# Patient Record
Sex: Female | Born: 1935 | Race: Black or African American | Hispanic: No | Marital: Married | Smoking: Never smoker
Health system: Southern US, Community
[De-identification: ages and names within clinical notes are randomized; demographics above are authoritative.]

## PROBLEM LIST (undated history)

## (undated) DIAGNOSIS — K7460 Unspecified cirrhosis of liver: Secondary | ICD-10-CM

## (undated) DIAGNOSIS — R279 Unspecified lack of coordination: Secondary | ICD-10-CM

## (undated) DIAGNOSIS — I10 Essential (primary) hypertension: Secondary | ICD-10-CM

## (undated) DIAGNOSIS — M1710 Unilateral primary osteoarthritis, unspecified knee: Secondary | ICD-10-CM

## (undated) DIAGNOSIS — N259 Disorder resulting from impaired renal tubular function, unspecified: Secondary | ICD-10-CM

## (undated) DIAGNOSIS — M1990 Unspecified osteoarthritis, unspecified site: Secondary | ICD-10-CM

## (undated) DIAGNOSIS — E109 Type 1 diabetes mellitus without complications: Secondary | ICD-10-CM

## (undated) DIAGNOSIS — M545 Low back pain, unspecified: Secondary | ICD-10-CM

## (undated) DIAGNOSIS — M79609 Pain in unspecified limb: Secondary | ICD-10-CM

## (undated) DIAGNOSIS — F329 Major depressive disorder, single episode, unspecified: Secondary | ICD-10-CM

## (undated) DIAGNOSIS — M719 Bursopathy, unspecified: Secondary | ICD-10-CM

## (undated) DIAGNOSIS — E039 Hypothyroidism, unspecified: Secondary | ICD-10-CM

## (undated) DIAGNOSIS — E785 Hyperlipidemia, unspecified: Secondary | ICD-10-CM

## (undated) DIAGNOSIS — M67919 Unspecified disorder of synovium and tendon, unspecified shoulder: Secondary | ICD-10-CM

## (undated) DIAGNOSIS — G894 Chronic pain syndrome: Secondary | ICD-10-CM

## (undated) DIAGNOSIS — F068 Other specified mental disorders due to known physiological condition: Secondary | ICD-10-CM

## (undated) DIAGNOSIS — J984 Other disorders of lung: Secondary | ICD-10-CM

## (undated) DIAGNOSIS — G510 Bell's palsy: Secondary | ICD-10-CM

## (undated) DIAGNOSIS — Z86010 Personal history of colonic polyps: Secondary | ICD-10-CM

## (undated) HISTORY — DX: Chronic pain syndrome: G89.4

## (undated) HISTORY — DX: Personal history of colonic polyps: Z86.010

## (undated) HISTORY — PX: COLONOSCOPY W/ POLYPECTOMY: SHX1380

## (undated) HISTORY — DX: Other disorders of lung: J98.4

## (undated) HISTORY — DX: Low back pain: M54.5

## (undated) HISTORY — DX: Pain in unspecified limb: M79.609

## (undated) HISTORY — DX: Unspecified osteoarthritis, unspecified site: M19.90

## (undated) HISTORY — DX: Unspecified disorder of synovium and tendon, unspecified shoulder: M67.919

## (undated) HISTORY — DX: Other specified mental disorders due to known physiological condition: F06.8

## (undated) HISTORY — DX: Essential (primary) hypertension: I10

## (undated) HISTORY — DX: Low back pain, unspecified: M54.50

## (undated) HISTORY — DX: Hypothyroidism, unspecified: E03.9

## (undated) HISTORY — DX: Major depressive disorder, single episode, unspecified: F32.9

## (undated) HISTORY — PX: TOTAL ABDOMINAL HYSTERECTOMY: SHX209

## (undated) HISTORY — DX: Disorder resulting from impaired renal tubular function, unspecified: N25.9

## (undated) HISTORY — DX: Unilateral primary osteoarthritis, unspecified knee: M17.10

## (undated) HISTORY — DX: Unspecified lack of coordination: R27.9

## (undated) HISTORY — DX: Hyperlipidemia, unspecified: E78.5

## (undated) HISTORY — DX: Bursopathy, unspecified: M71.9

## (undated) HISTORY — DX: Type 1 diabetes mellitus without complications: E10.9

---

## 1999-01-17 ENCOUNTER — Encounter: Admission: RE | Admit: 1999-01-17 | Discharge: 1999-04-17 | Payer: Self-pay | Admitting: Internal Medicine

## 2001-10-20 ENCOUNTER — Encounter: Payer: Self-pay | Admitting: Internal Medicine

## 2001-10-20 ENCOUNTER — Encounter: Admission: RE | Admit: 2001-10-20 | Discharge: 2001-10-20 | Payer: Self-pay | Admitting: Internal Medicine

## 2002-01-14 ENCOUNTER — Ambulatory Visit (HOSPITAL_COMMUNITY): Admission: RE | Admit: 2002-01-14 | Discharge: 2002-01-14 | Payer: Self-pay | Admitting: Gastroenterology

## 2002-01-14 ENCOUNTER — Encounter (INDEPENDENT_AMBULATORY_CARE_PROVIDER_SITE_OTHER): Payer: Self-pay | Admitting: *Deleted

## 2004-01-28 ENCOUNTER — Ambulatory Visit: Payer: Self-pay | Admitting: Endocrinology

## 2004-02-15 ENCOUNTER — Ambulatory Visit: Payer: Self-pay | Admitting: Endocrinology

## 2004-03-24 ENCOUNTER — Ambulatory Visit: Payer: Self-pay | Admitting: Endocrinology

## 2004-04-13 ENCOUNTER — Encounter: Admission: RE | Admit: 2004-04-13 | Discharge: 2004-04-13 | Payer: Self-pay | Admitting: Endocrinology

## 2004-04-18 ENCOUNTER — Ambulatory Visit: Payer: Self-pay | Admitting: Endocrinology

## 2004-04-26 ENCOUNTER — Ambulatory Visit: Payer: Self-pay

## 2005-01-01 ENCOUNTER — Ambulatory Visit (HOSPITAL_COMMUNITY): Admission: RE | Admit: 2005-01-01 | Discharge: 2005-01-01 | Payer: Self-pay | Admitting: Endocrinology

## 2005-01-01 ENCOUNTER — Ambulatory Visit: Payer: Self-pay | Admitting: Endocrinology

## 2005-01-08 ENCOUNTER — Ambulatory Visit: Payer: Self-pay | Admitting: Endocrinology

## 2005-01-12 ENCOUNTER — Ambulatory Visit: Payer: Self-pay | Admitting: Internal Medicine

## 2005-01-23 ENCOUNTER — Ambulatory Visit: Payer: Self-pay | Admitting: Endocrinology

## 2005-02-08 ENCOUNTER — Ambulatory Visit (HOSPITAL_COMMUNITY): Admission: RE | Admit: 2005-02-08 | Discharge: 2005-02-08 | Payer: Self-pay | Admitting: Endocrinology

## 2005-02-12 ENCOUNTER — Ambulatory Visit: Payer: Self-pay | Admitting: Endocrinology

## 2005-02-16 ENCOUNTER — Ambulatory Visit: Payer: Self-pay

## 2005-02-16 ENCOUNTER — Encounter: Payer: Self-pay | Admitting: Internal Medicine

## 2005-02-20 ENCOUNTER — Ambulatory Visit: Payer: Self-pay | Admitting: Endocrinology

## 2005-03-22 ENCOUNTER — Ambulatory Visit: Payer: Self-pay | Admitting: Endocrinology

## 2005-03-29 ENCOUNTER — Ambulatory Visit: Payer: Self-pay | Admitting: Endocrinology

## 2005-04-23 ENCOUNTER — Ambulatory Visit: Payer: Self-pay | Admitting: Endocrinology

## 2005-05-21 ENCOUNTER — Ambulatory Visit: Payer: Self-pay | Admitting: Endocrinology

## 2005-06-18 ENCOUNTER — Ambulatory Visit: Payer: Self-pay | Admitting: Endocrinology

## 2005-07-19 ENCOUNTER — Ambulatory Visit: Payer: Self-pay | Admitting: Endocrinology

## 2005-08-15 ENCOUNTER — Ambulatory Visit: Payer: Self-pay | Admitting: Endocrinology

## 2005-09-12 ENCOUNTER — Ambulatory Visit: Payer: Self-pay | Admitting: Endocrinology

## 2005-10-24 ENCOUNTER — Ambulatory Visit: Payer: Self-pay | Admitting: Endocrinology

## 2005-11-28 ENCOUNTER — Ambulatory Visit: Payer: Self-pay | Admitting: Endocrinology

## 2005-12-19 ENCOUNTER — Ambulatory Visit: Payer: Self-pay | Admitting: Endocrinology

## 2006-02-12 ENCOUNTER — Ambulatory Visit: Payer: Self-pay | Admitting: Endocrinology

## 2006-03-27 ENCOUNTER — Ambulatory Visit: Payer: Self-pay | Admitting: Endocrinology

## 2006-05-16 ENCOUNTER — Ambulatory Visit: Payer: Self-pay | Admitting: Endocrinology

## 2006-05-16 LAB — CONVERTED CEMR LAB: Hgb A1c MFr Bld: 8.1 % — ABNORMAL HIGH (ref 4.6–6.0)

## 2006-06-10 ENCOUNTER — Ambulatory Visit: Payer: Self-pay | Admitting: Endocrinology

## 2006-07-22 ENCOUNTER — Ambulatory Visit: Payer: Self-pay | Admitting: Endocrinology

## 2006-08-23 ENCOUNTER — Encounter: Payer: Self-pay | Admitting: Endocrinology

## 2006-08-23 DIAGNOSIS — E109 Type 1 diabetes mellitus without complications: Secondary | ICD-10-CM

## 2006-08-23 DIAGNOSIS — E785 Hyperlipidemia, unspecified: Secondary | ICD-10-CM

## 2006-08-23 DIAGNOSIS — E039 Hypothyroidism, unspecified: Secondary | ICD-10-CM

## 2006-08-23 DIAGNOSIS — F329 Major depressive disorder, single episode, unspecified: Secondary | ICD-10-CM

## 2006-08-23 DIAGNOSIS — F3289 Other specified depressive episodes: Secondary | ICD-10-CM

## 2006-08-23 DIAGNOSIS — M1990 Unspecified osteoarthritis, unspecified site: Secondary | ICD-10-CM

## 2006-08-23 DIAGNOSIS — I10 Essential (primary) hypertension: Secondary | ICD-10-CM

## 2006-08-23 DIAGNOSIS — N259 Disorder resulting from impaired renal tubular function, unspecified: Secondary | ICD-10-CM | POA: Insufficient documentation

## 2006-08-23 DIAGNOSIS — F068 Other specified mental disorders due to known physiological condition: Secondary | ICD-10-CM

## 2006-08-23 HISTORY — DX: Type 1 diabetes mellitus without complications: E10.9

## 2006-08-23 HISTORY — DX: Major depressive disorder, single episode, unspecified: F32.9

## 2006-08-23 HISTORY — DX: Hypothyroidism, unspecified: E03.9

## 2006-08-23 HISTORY — DX: Unspecified osteoarthritis, unspecified site: M19.90

## 2006-08-23 HISTORY — DX: Essential (primary) hypertension: I10

## 2006-08-23 HISTORY — DX: Hyperlipidemia, unspecified: E78.5

## 2006-08-23 HISTORY — DX: Other specified mental disorders due to known physiological condition: F06.8

## 2006-08-23 HISTORY — DX: Disorder resulting from impaired renal tubular function, unspecified: N25.9

## 2006-08-23 HISTORY — DX: Other specified depressive episodes: F32.89

## 2006-08-30 ENCOUNTER — Ambulatory Visit: Payer: Self-pay | Admitting: Endocrinology

## 2006-08-30 LAB — CONVERTED CEMR LAB
Chloride: 108 meq/L (ref 96–112)
Cholesterol: 131 mg/dL (ref 0–200)
Creatinine, Ser: 1.4 mg/dL — ABNORMAL HIGH (ref 0.4–1.2)
Eosinophils Relative: 1.8 % (ref 0.0–5.0)
GFR calc Af Amer: 47 mL/min
GFR calc non Af Amer: 39 mL/min
Glucose, Bld: 73 mg/dL (ref 70–99)
HCT: 47.3 % — ABNORMAL HIGH (ref 36.0–46.0)
Hemoglobin: 16.1 g/dL — ABNORMAL HIGH (ref 12.0–15.0)
LDL Cholesterol: 61 mg/dL (ref 0–99)
MCHC: 34.1 g/dL (ref 30.0–36.0)
MCV: 98.3 fL (ref 78.0–100.0)
Monocytes Relative: 3.6 % (ref 3.0–11.0)
Platelets: 298 10*3/uL (ref 150–400)
Potassium: 4.6 meq/L (ref 3.5–5.1)
Sodium: 146 meq/L — ABNORMAL HIGH (ref 135–145)
Triglycerides: 120 mg/dL (ref 0–149)

## 2006-10-28 ENCOUNTER — Ambulatory Visit: Payer: Self-pay | Admitting: Endocrinology

## 2007-01-07 ENCOUNTER — Ambulatory Visit: Payer: Self-pay | Admitting: Endocrinology

## 2007-02-06 ENCOUNTER — Telehealth (INDEPENDENT_AMBULATORY_CARE_PROVIDER_SITE_OTHER): Payer: Self-pay | Admitting: *Deleted

## 2007-02-07 ENCOUNTER — Ambulatory Visit: Payer: Self-pay | Admitting: Endocrinology

## 2007-02-10 ENCOUNTER — Telehealth (INDEPENDENT_AMBULATORY_CARE_PROVIDER_SITE_OTHER): Payer: Self-pay | Admitting: *Deleted

## 2007-04-04 ENCOUNTER — Telehealth: Payer: Self-pay | Admitting: Endocrinology

## 2007-04-21 ENCOUNTER — Telehealth: Payer: Self-pay | Admitting: Endocrinology

## 2007-04-29 ENCOUNTER — Ambulatory Visit: Payer: Self-pay | Admitting: Endocrinology

## 2007-04-29 DIAGNOSIS — Z860100 Personal history of colon polyps, unspecified: Secondary | ICD-10-CM | POA: Insufficient documentation

## 2007-04-29 HISTORY — DX: Personal history of colon polyps, unspecified: Z86.0100

## 2007-04-29 HISTORY — DX: Personal history of colonic polyps: Z86.010

## 2007-04-30 LAB — CONVERTED CEMR LAB: Hgb A1c MFr Bld: 6.8 % — ABNORMAL HIGH (ref 4.6–6.0)

## 2007-05-22 ENCOUNTER — Telehealth: Payer: Self-pay | Admitting: Endocrinology

## 2007-06-17 ENCOUNTER — Encounter: Payer: Self-pay | Admitting: Endocrinology

## 2007-06-18 ENCOUNTER — Telehealth (INDEPENDENT_AMBULATORY_CARE_PROVIDER_SITE_OTHER): Payer: Self-pay | Admitting: *Deleted

## 2007-06-20 ENCOUNTER — Telehealth: Payer: Self-pay | Admitting: Endocrinology

## 2007-06-25 ENCOUNTER — Telehealth (INDEPENDENT_AMBULATORY_CARE_PROVIDER_SITE_OTHER): Payer: Self-pay | Admitting: *Deleted

## 2007-07-02 ENCOUNTER — Telehealth (INDEPENDENT_AMBULATORY_CARE_PROVIDER_SITE_OTHER): Payer: Self-pay | Admitting: *Deleted

## 2007-09-16 ENCOUNTER — Ambulatory Visit: Payer: Self-pay | Admitting: Endocrinology

## 2007-09-16 ENCOUNTER — Encounter (INDEPENDENT_AMBULATORY_CARE_PROVIDER_SITE_OTHER): Payer: Self-pay | Admitting: *Deleted

## 2007-12-08 ENCOUNTER — Telehealth: Payer: Self-pay | Admitting: Endocrinology

## 2007-12-09 ENCOUNTER — Encounter: Payer: Self-pay | Admitting: Endocrinology

## 2008-01-23 ENCOUNTER — Telehealth (INDEPENDENT_AMBULATORY_CARE_PROVIDER_SITE_OTHER): Payer: Self-pay | Admitting: *Deleted

## 2008-01-30 ENCOUNTER — Ambulatory Visit: Payer: Self-pay | Admitting: Endocrinology

## 2008-01-30 ENCOUNTER — Ambulatory Visit: Payer: Self-pay | Admitting: Cardiology

## 2008-01-30 DIAGNOSIS — R519 Headache, unspecified: Secondary | ICD-10-CM | POA: Insufficient documentation

## 2008-01-30 DIAGNOSIS — M545 Low back pain: Secondary | ICD-10-CM

## 2008-01-30 DIAGNOSIS — R51 Headache: Secondary | ICD-10-CM

## 2008-02-10 DIAGNOSIS — K7689 Other specified diseases of liver: Secondary | ICD-10-CM

## 2008-02-10 LAB — CONVERTED CEMR LAB
ALT: 43 units/L — ABNORMAL HIGH (ref 0–35)
Alkaline Phosphatase: 98 units/L (ref 39–117)
Bilirubin, Direct: 0.1 mg/dL (ref 0.0–0.3)
Total Bilirubin: 0.8 mg/dL (ref 0.3–1.2)
Total Protein: 6.7 g/dL (ref 6.0–8.3)

## 2008-02-11 ENCOUNTER — Encounter: Payer: Self-pay | Admitting: Endocrinology

## 2008-02-12 ENCOUNTER — Telehealth (INDEPENDENT_AMBULATORY_CARE_PROVIDER_SITE_OTHER): Payer: Self-pay | Admitting: *Deleted

## 2008-02-16 ENCOUNTER — Telehealth: Payer: Self-pay | Admitting: Endocrinology

## 2008-02-20 ENCOUNTER — Telehealth: Payer: Self-pay | Admitting: Endocrinology

## 2008-02-23 ENCOUNTER — Encounter: Payer: Self-pay | Admitting: Endocrinology

## 2008-03-11 ENCOUNTER — Telehealth: Payer: Self-pay | Admitting: Endocrinology

## 2008-05-19 ENCOUNTER — Ambulatory Visit: Payer: Self-pay | Admitting: Endocrinology

## 2008-05-19 LAB — CONVERTED CEMR LAB
Hgb A1c MFr Bld: 8.3 % — ABNORMAL HIGH (ref 4.6–6.0)
Sed Rate: 12 mm/hr (ref 0–22)

## 2008-06-03 ENCOUNTER — Ambulatory Visit: Payer: Self-pay | Admitting: Endocrinology

## 2008-06-04 ENCOUNTER — Telehealth: Payer: Self-pay | Admitting: Endocrinology

## 2008-06-15 ENCOUNTER — Encounter: Payer: Self-pay | Admitting: Endocrinology

## 2008-06-18 ENCOUNTER — Encounter: Payer: Self-pay | Admitting: Endocrinology

## 2008-06-23 ENCOUNTER — Telehealth (INDEPENDENT_AMBULATORY_CARE_PROVIDER_SITE_OTHER): Payer: Self-pay | Admitting: *Deleted

## 2008-07-05 ENCOUNTER — Telehealth: Payer: Self-pay | Admitting: Endocrinology

## 2008-07-27 ENCOUNTER — Ambulatory Visit: Payer: Self-pay | Admitting: Endocrinology

## 2008-07-27 DIAGNOSIS — R61 Generalized hyperhidrosis: Secondary | ICD-10-CM

## 2008-07-27 LAB — CONVERTED CEMR LAB
BUN: 18 mg/dL (ref 6–23)
CO2: 31 meq/L (ref 19–32)
Chloride: 108 meq/L (ref 96–112)
Eosinophils Absolute: 0.1 10*3/uL (ref 0.0–0.7)
Glucose, Bld: 222 mg/dL — ABNORMAL HIGH (ref 70–99)
Hemoglobin: 15.3 g/dL — ABNORMAL HIGH (ref 12.0–15.0)
Hgb A1c MFr Bld: 9.2 % — ABNORMAL HIGH (ref 4.6–6.5)
MCV: 98.7 fL (ref 78.0–100.0)
Monocytes Absolute: 0.3 10*3/uL (ref 0.1–1.0)
Neutrophils Relative %: 74.2 % (ref 43.0–77.0)
Potassium: 4.4 meq/L (ref 3.5–5.1)
RBC: 4.51 M/uL (ref 3.87–5.11)
Sodium: 145 meq/L (ref 135–145)
TSH: 9.12 microintl units/mL — ABNORMAL HIGH (ref 0.35–5.50)

## 2008-08-27 ENCOUNTER — Telehealth: Payer: Self-pay | Admitting: Endocrinology

## 2008-09-14 ENCOUNTER — Ambulatory Visit: Payer: Self-pay | Admitting: Endocrinology

## 2008-11-01 ENCOUNTER — Telehealth (INDEPENDENT_AMBULATORY_CARE_PROVIDER_SITE_OTHER): Payer: Self-pay | Admitting: *Deleted

## 2008-11-04 ENCOUNTER — Telehealth (INDEPENDENT_AMBULATORY_CARE_PROVIDER_SITE_OTHER): Payer: Self-pay | Admitting: *Deleted

## 2008-11-08 ENCOUNTER — Ambulatory Visit: Payer: Self-pay | Admitting: Endocrinology

## 2008-11-18 ENCOUNTER — Telehealth: Payer: Self-pay | Admitting: Endocrinology

## 2008-11-18 ENCOUNTER — Telehealth (INDEPENDENT_AMBULATORY_CARE_PROVIDER_SITE_OTHER): Payer: Self-pay | Admitting: *Deleted

## 2008-11-26 ENCOUNTER — Telehealth: Payer: Self-pay | Admitting: Endocrinology

## 2008-12-21 ENCOUNTER — Ambulatory Visit: Payer: Self-pay | Admitting: Endocrinology

## 2009-01-04 ENCOUNTER — Ambulatory Visit: Payer: Self-pay | Admitting: Endocrinology

## 2009-01-18 ENCOUNTER — Ambulatory Visit: Payer: Self-pay | Admitting: Endocrinology

## 2009-01-18 DIAGNOSIS — R0602 Shortness of breath: Secondary | ICD-10-CM | POA: Insufficient documentation

## 2009-01-18 DIAGNOSIS — R609 Edema, unspecified: Secondary | ICD-10-CM

## 2009-01-19 ENCOUNTER — Encounter: Payer: Self-pay | Admitting: Endocrinology

## 2009-01-19 ENCOUNTER — Ambulatory Visit (HOSPITAL_COMMUNITY): Admission: RE | Admit: 2009-01-19 | Discharge: 2009-01-19 | Payer: Self-pay | Admitting: Endocrinology

## 2009-01-19 ENCOUNTER — Ambulatory Visit: Payer: Self-pay | Admitting: Vascular Surgery

## 2009-01-19 LAB — CONVERTED CEMR LAB
Alkaline Phosphatase: 150 units/L — ABNORMAL HIGH (ref 39–117)
BUN: 26 mg/dL — ABNORMAL HIGH (ref 6–23)
Basophils Absolute: 0 10*3/uL (ref 0.0–0.1)
Basophils Relative: 0.7 % (ref 0.0–3.0)
Bilirubin, Direct: 0.2 mg/dL (ref 0.0–0.3)
Calcium: 8.7 mg/dL (ref 8.4–10.5)
Cholesterol: 126 mg/dL (ref 0–200)
Creatinine,U: 165.1 mg/dL
Eosinophils Relative: 2.2 % (ref 0.0–5.0)
Folate: >20 ng/mL
GFR calc non Af Amer: 46.63 mL/min (ref >60)
HDL: 51.9 mg/dL (ref >39.00)
Hemoglobin, Urine: NEGATIVE
Hgb A1c MFr Bld: 10 % — ABNORMAL HIGH (ref 4.6–6.5)
Ketones, ur: NEGATIVE mg/dL
LDL Cholesterol: 58 mg/dL (ref 0–99)
Leukocytes, UA: NEGATIVE
MCHC: 33.6 g/dL (ref 30.0–36.0)
MCV: 101.6 fL — ABNORMAL HIGH (ref 78.0–100.0)
Microalb Creat Ratio: 7.3 mg/g (ref 0.0–30.0)
Monocytes Absolute: 0.4 10*3/uL (ref 0.1–1.0)
Neutro Abs: 4.2 10*3/uL (ref 1.4–7.7)
Neutrophils Relative %: 68.9 % (ref 43.0–77.0)
Nitrite: NEGATIVE
RBC: 4.2 M/uL (ref 3.87–5.11)
Sodium: 140 meq/L (ref 135–145)
Specific Gravity, Urine: 1.02 (ref 1.000–1.030)
Total Bilirubin: 0.9 mg/dL (ref 0.3–1.2)
Triglycerides: 79 mg/dL (ref 0.0–149.0)
Urine Glucose: NEGATIVE mg/dL
Urobilinogen, UA: 0.2 (ref 0.0–1.0)
VLDL: 15.8 mg/dL (ref 0.0–40.0)
pH: 5.5 (ref 5.0–8.0)

## 2009-01-27 ENCOUNTER — Ambulatory Visit: Payer: Self-pay | Admitting: Endocrinology

## 2009-01-31 ENCOUNTER — Telehealth: Payer: Self-pay | Admitting: Endocrinology

## 2009-02-03 ENCOUNTER — Telehealth (INDEPENDENT_AMBULATORY_CARE_PROVIDER_SITE_OTHER): Payer: Self-pay | Admitting: *Deleted

## 2009-02-21 ENCOUNTER — Telehealth: Payer: Self-pay | Admitting: Endocrinology

## 2009-02-22 ENCOUNTER — Ambulatory Visit: Payer: Self-pay | Admitting: Endocrinology

## 2009-03-15 ENCOUNTER — Telehealth (INDEPENDENT_AMBULATORY_CARE_PROVIDER_SITE_OTHER): Payer: Self-pay | Admitting: *Deleted

## 2009-03-17 ENCOUNTER — Encounter (INDEPENDENT_AMBULATORY_CARE_PROVIDER_SITE_OTHER): Payer: Self-pay | Admitting: *Deleted

## 2009-04-12 ENCOUNTER — Telehealth: Payer: Self-pay | Admitting: Internal Medicine

## 2009-04-20 ENCOUNTER — Encounter
Admission: RE | Admit: 2009-04-20 | Discharge: 2009-07-19 | Payer: Self-pay | Admitting: Physical Medicine and Rehabilitation

## 2009-04-21 ENCOUNTER — Telehealth: Payer: Self-pay | Admitting: Endocrinology

## 2009-04-22 ENCOUNTER — Ambulatory Visit: Payer: Self-pay | Admitting: Physical Medicine and Rehabilitation

## 2009-05-10 ENCOUNTER — Telehealth: Payer: Self-pay | Admitting: Endocrinology

## 2009-05-10 ENCOUNTER — Ambulatory Visit (HOSPITAL_COMMUNITY)
Admission: RE | Admit: 2009-05-10 | Discharge: 2009-05-10 | Payer: Self-pay | Admitting: Physical Medicine and Rehabilitation

## 2009-05-18 ENCOUNTER — Ambulatory Visit: Payer: Self-pay | Admitting: Physical Medicine and Rehabilitation

## 2009-06-17 ENCOUNTER — Ambulatory Visit: Payer: Self-pay | Admitting: Endocrinology

## 2009-06-21 LAB — CONVERTED CEMR LAB
Hgb A1c MFr Bld: 10 % — ABNORMAL HIGH (ref 4.6–6.5)
LDL Cholesterol: 59 mg/dL (ref 0–99)
Total CHOL/HDL Ratio: 2
VLDL: 22.6 mg/dL (ref 0.0–40.0)

## 2009-06-24 ENCOUNTER — Ambulatory Visit: Payer: Self-pay | Admitting: Physical Medicine and Rehabilitation

## 2009-07-05 ENCOUNTER — Ambulatory Visit (HOSPITAL_COMMUNITY)
Admission: RE | Admit: 2009-07-05 | Discharge: 2009-07-05 | Payer: Self-pay | Admitting: Physical Medicine and Rehabilitation

## 2009-07-26 ENCOUNTER — Encounter
Admission: RE | Admit: 2009-07-26 | Discharge: 2009-08-05 | Payer: Self-pay | Admitting: Physical Medicine & Rehabilitation

## 2009-07-29 ENCOUNTER — Ambulatory Visit: Payer: Self-pay | Admitting: Physical Medicine and Rehabilitation

## 2009-07-29 ENCOUNTER — Encounter
Admission: RE | Admit: 2009-07-29 | Discharge: 2009-10-27 | Payer: Self-pay | Admitting: Physical Medicine and Rehabilitation

## 2009-08-03 ENCOUNTER — Telehealth: Payer: Self-pay | Admitting: Endocrinology

## 2009-08-12 ENCOUNTER — Encounter: Payer: Self-pay | Admitting: Endocrinology

## 2009-08-12 ENCOUNTER — Ambulatory Visit: Payer: Self-pay | Admitting: Physical Medicine and Rehabilitation

## 2009-08-17 ENCOUNTER — Telehealth: Payer: Self-pay | Admitting: Endocrinology

## 2009-09-09 ENCOUNTER — Ambulatory Visit: Payer: Self-pay | Admitting: Physical Medicine and Rehabilitation

## 2009-09-16 ENCOUNTER — Ambulatory Visit: Payer: Self-pay | Admitting: Endocrinology

## 2009-09-28 ENCOUNTER — Encounter
Admission: RE | Admit: 2009-09-28 | Discharge: 2009-12-18 | Payer: Self-pay | Admitting: Physical Medicine and Rehabilitation

## 2009-10-12 ENCOUNTER — Ambulatory Visit: Payer: Self-pay | Admitting: Physical Medicine and Rehabilitation

## 2009-11-02 ENCOUNTER — Ambulatory Visit: Payer: Self-pay | Admitting: Physical Medicine and Rehabilitation

## 2009-11-02 ENCOUNTER — Encounter
Admission: RE | Admit: 2009-11-02 | Discharge: 2010-01-31 | Payer: Self-pay | Admitting: Physical Medicine and Rehabilitation

## 2009-11-29 ENCOUNTER — Telehealth: Payer: Self-pay | Admitting: Endocrinology

## 2009-12-05 ENCOUNTER — Encounter
Admission: RE | Admit: 2009-12-05 | Discharge: 2009-12-15 | Payer: Self-pay | Admitting: Physical Medicine and Rehabilitation

## 2009-12-21 ENCOUNTER — Ambulatory Visit: Payer: Self-pay | Admitting: Physical Medicine and Rehabilitation

## 2009-12-21 ENCOUNTER — Encounter
Admission: RE | Admit: 2009-12-21 | Discharge: 2010-01-04 | Payer: Self-pay | Admitting: Physical Medicine and Rehabilitation

## 2009-12-22 ENCOUNTER — Ambulatory Visit: Payer: Self-pay | Admitting: Endocrinology

## 2009-12-22 LAB — CONVERTED CEMR LAB
ALT: 27 units/L (ref 0–35)
AST: 39 units/L — ABNORMAL HIGH (ref 0–37)
BUN: 19 mg/dL (ref 6–23)
Bilirubin Urine: NEGATIVE
Bilirubin, Direct: 0.2 mg/dL (ref 0.0–0.3)
CO2: 35 meq/L — ABNORMAL HIGH (ref 19–32)
Cholesterol: 271 mg/dL — ABNORMAL HIGH (ref 0–200)
Creatinine,U: 79.8 mg/dL
Eosinophils Relative: 2.5 % (ref 0.0–5.0)
GFR calc non Af Amer: 51.59 mL/min (ref >60)
Glucose, Bld: 216 mg/dL — ABNORMAL HIGH (ref 70–99)
Hgb A1c MFr Bld: 11.3 % — ABNORMAL HIGH (ref 4.6–6.5)
Leukocytes, UA: NEGATIVE
Lymphocytes Relative: 25.5 % (ref 12.0–46.0)
MCHC: 34.4 g/dL (ref 30.0–36.0)
Microalb, Ur: 0.5 mg/dL (ref 0.0–1.9)
Monocytes Absolute: 0.4 10*3/uL (ref 0.1–1.0)
Neutro Abs: 3.8 10*3/uL (ref 1.4–7.7)
Neutrophils Relative %: 65.2 % (ref 43.0–77.0)
Nitrite: NEGATIVE
PTH: 49.3 pg/mL (ref 14.0–72.0)
Platelets: 165 10*3/uL (ref 150.0–400.0)
Potassium: 5.2 meq/L — ABNORMAL HIGH (ref 3.5–5.1)
RDW: 13 % (ref 11.5–14.6)
TSH: 4.74 microintl units/mL (ref 0.35–5.50)
Total CHOL/HDL Ratio: 4
Total Protein, Urine: NEGATIVE mg/dL
Total Protein: 6.8 g/dL (ref 6.0–8.3)
Urine Glucose: 100 mg/dL
Urobilinogen, UA: 1 (ref 0.0–1.0)
VLDL: 43.6 mg/dL — ABNORMAL HIGH (ref 0.0–40.0)
WBC: 5.9 10*3/uL (ref 4.5–10.5)

## 2010-01-16 ENCOUNTER — Ambulatory Visit: Payer: Self-pay | Admitting: Physical Medicine and Rehabilitation

## 2010-03-14 ENCOUNTER — Encounter
Admission: RE | Admit: 2010-03-14 | Discharge: 2010-03-27 | Payer: Self-pay | Source: Home / Self Care | Attending: Physical Medicine and Rehabilitation | Admitting: Physical Medicine and Rehabilitation

## 2010-03-22 ENCOUNTER — Ambulatory Visit: Payer: Self-pay | Admitting: Physical Medicine and Rehabilitation

## 2010-03-27 ENCOUNTER — Encounter
Admission: RE | Admit: 2010-03-27 | Discharge: 2010-03-31 | Payer: Self-pay | Source: Home / Self Care | Attending: Physical Medicine and Rehabilitation | Admitting: Physical Medicine and Rehabilitation

## 2010-03-28 ENCOUNTER — Ambulatory Visit
Admission: RE | Admit: 2010-03-28 | Discharge: 2010-03-28 | Payer: Self-pay | Source: Home / Self Care | Attending: Physical Medicine and Rehabilitation | Admitting: Physical Medicine and Rehabilitation

## 2010-03-31 ENCOUNTER — Ambulatory Visit
Admission: RE | Admit: 2010-03-31 | Discharge: 2010-03-31 | Payer: Self-pay | Source: Home / Self Care | Attending: Physical Medicine and Rehabilitation | Admitting: Physical Medicine and Rehabilitation

## 2010-04-05 ENCOUNTER — Ambulatory Visit: Admit: 2010-04-05 | Payer: Self-pay | Admitting: Endocrinology

## 2010-04-15 ENCOUNTER — Encounter: Payer: Self-pay | Admitting: Endocrinology

## 2010-04-17 ENCOUNTER — Encounter
Admission: RE | Admit: 2010-04-17 | Discharge: 2010-04-25 | Payer: Self-pay | Source: Home / Self Care | Attending: Physical Medicine and Rehabilitation | Admitting: Physical Medicine and Rehabilitation

## 2010-04-23 LAB — CONVERTED CEMR LAB
ALT: 41 units/L — ABNORMAL HIGH (ref 0–35)
Albumin: 4.1 g/dL (ref 3.5–5.2)
Cholesterol: 148 mg/dL (ref 0–200)
HDL: 56.7 mg/dL (ref >39.0)
Hgb A1c MFr Bld: 7 % — ABNORMAL HIGH (ref 4.6–6.0)

## 2010-04-24 ENCOUNTER — Ambulatory Visit
Admission: RE | Admit: 2010-04-24 | Discharge: 2010-04-24 | Payer: Self-pay | Source: Home / Self Care | Attending: Physical Medicine and Rehabilitation | Admitting: Physical Medicine and Rehabilitation

## 2010-04-25 NOTE — Progress Notes (Signed)
  Phone Note Refill Request Message from:  Fax from Pharmacy on Aug 03, 2009 11:00 AM  Refills Requested: Medication #1:  PEN NEEDLES 5/16" 31G X 8 MM MISC tid   Dosage confirmed as above?Dosage Confirmed Initial call taken by: Josph Macho RMA,  Aug 03, 2009 11:00 AM    Prescriptions: PEN NEEDLES 5/16" 31G X 8 MM MISC (INSULIN PEN NEEDLE) tid  #300 x 2   Entered by:   Josph Macho RMA   Authorized by:   Minus Breeding MD   Signed by:   Josph Macho RMA on 08/03/2009   Method used:   Electronically to        [facility] (mail-order)             ,          Ph: [phone]       Fax: [phone]   RxID:   [id number]

## 2010-04-25 NOTE — Progress Notes (Signed)
  Phone Note Call from Patient   Summary of Call: Pts spouse Annia Friendly) called stating Furosemide needs to be sent to [facility] Initial call taken by: Josph Macho RMA,  Aug 17, 2009 10:54 AM    Prescriptions: FUROSEMIDE 40 MG TABS (FUROSEMIDE) 1 once daily  #90 x 2   Entered by:   Josph Macho RMA   Authorized by:   Minus Breeding MD   Signed by:   Josph Macho RMA on 08/17/2009   Method used:   Electronically to        [facility] (mail-order)             ,          Ph: [phone]       Fax: [phone]   RxID:   [id number]

## 2010-04-25 NOTE — Assessment & Plan Note (Signed)
Summary: FU---PER SPOUSE--STC   Vital Signs:  Patient profile:   77 year old female Height:      62 inches (157.48 cm) Weight:      199.75 pounds (90.80 kg) BMI:     36.67 O2 Sat:      95 % on Room air Temp:     97.9 degrees F (36.61 degrees C) oral Pulse rate:   88 / minute BP sitting:   138 / 92  (left arm) Cuff size:   large  Vitals Entered By: Brenton Grills MA (December 22, 2009 1:54 PM)  O2 Flow:  Room air CC: F/U appt/aj Is Patient Diabetic? Yes   Primary Tymere Depuy:  Minus Breeding MD  CC:  F/U appt/aj.  History of Present Illness: the status of at least 3 ongoing medical problems is addressed today: dm:  no cbg record, but states cbg's vary from 180-300.  husb says pt's diet is poor.  husb says there is no trend throughout the day.  no hypoglycemic sxs. dementia: husb says pt is poorly motivated to exercise.   dyslipidemia:  no weight loss. hypokalemia:  pt has fatigue  Current Medications (verified): 1)  Paroxetine Hcl 20 Mg  Tabs (Paroxetine Hcl) .... Take 1 By Mouth Qd 2)  Klor-Con M10 10 Meq  Tbcr (Potassium Chloride Crys Cr) .... Take 1 By Mouth Qd 3)  Aricept 10 Mg  Tabs (Donepezil Hcl) .... Take 1 By Mouth Qd 4)  Namenda 10 Mg  Tabs (Memantine Hcl) .... Take 1 By Mouth Two Times A Day 5)  Amitriptyline Hcl 10 Mg  Tabs (Amitriptyline Hcl) .... 1/2 Qhs 6)  Vyvanse 30 Mg  Caps (Lisdexamfetamine Dimesylate) .... Take 1 Po Q Am 7)  Pen Needles 5/16" 31g X 8 Mm Misc (Insulin Pen Needle) .... Tid 8)  Levemir Flexpen 100 Unit/ml Soln (Insulin Detemir) .... 55 Units Each Am 9)  [employer] .Marland Kitchen.. 331.0 10)  Toilet Seat .Marland Kitchen.. 331.0 11)  Levothyroxine Sodium 150 Mcg Tabs (Levothyroxine Sodium) .Marland Kitchen.. 1 Qd 12)  Hydrocodone-Acetaminophen 5-500 Mg Tabs (Hydrocodone-Acetaminophen) .Marland Kitchen.. 1 Q4h As Needed Pain 13)  Xalatan Opth Soln 2.5 Ml .... At Bedtime 14)  Aspirin 81mg  .... 1 Daily 15)  Simvastatin 40 Mg Tabs (Simvastatin) .Marland Kitchen.. 1 Qhs 16)  Benicar 40 Mg Tabs (Olmesartan  Medoxomil) .Marland Kitchen.. 1 Qd 17)  Tessalon Perles 100 Mg Caps (Benzonatate) .Marland Kitchen.. 1 By Mouth Three Times A Day As Needed For Cough 18)  Furosemide 40 Mg Tabs (Furosemide) .Marland Kitchen.. 1 Once Daily  Allergies (verified): No Known Drug Allergies  Past History:  Past Medical History: Last updated: 02/07/2007 Dementia Depression Diabetes mellitus, type I Hyperlipidemia Hypertension Hypothyroidism Osteoarthritis Renal insufficiency Mild restrictive lung disease by pulmanary function test in October 2006 with mildly reduce DLCO  Review of Systems  The patient denies weight gain.    Physical Exam  General:  obese.  no distress  Pulses:  dorsalis pedis intact bilat.   Extremities:  no deformity.  no ulcer on the feet.  feet are of normal color and temp. trace right pedal edema and trace left pedal edema.   Neurologic:  sensation is intact to touch on the feet  Additional Exam:  Potassium            [H]  5.2 mEq/L   Hemoglobin A1C       [H]  11.3 %   Cholesterol LDL 174.8 mg/dL   Impression & Recommendations:  Problem # 1:  DIABETES MELLITUS, TYPE I (  ICD-250.01) needs increased rx  Problem # 2:  HYPERLIPIDEMIA (ICD-272.4) Assessment: Deteriorated needs increased rx  Problem # 3:  hypokalemia overcontrolled  Medications Added to Medication List This Visit: 1)  Levemir Flexpen 100 Unit/ml Soln (Insulin detemir) .... 65 units each am, and pen needles once daily  Other Orders: T-Parathyroid Hormone, Intact w/ Calcium (84132-44010) Flu Vaccine 65yrs + MEDICARE PATIENTS (U7253) Administration Flu vaccine - MCR (G0008) TLB-Lipid Panel (80061-LIPID) TLB-BMP (Basic Metabolic Panel-BMET) (80048-METABOL) TLB-CBC Platelet - w/Differential (85025-CBCD) TLB-Hepatic/Liver Function Pnl (80076-HEPATIC) TLB-TSH (Thyroid Stimulating Hormone) (84443-TSH) TLB-A1C / Hgb A1C (Glycohemoglobin) (83036-A1C) TLB-Microalbumin/Creat Ratio, Urine (82043-MALB) TLB-Udip w/ Micro (81001-URINE) TLB-B12,  Serum-Total ONLY (66440-H47) Est. Patient Level IV (42595)  Patient Instructions: 1)  increase levemir to 65 units each am. 2)  check your blood sugar 2 times a day.  vary the time of day when you check, between before the 3 meals, and at bedtime.  also check if you have symptoms of your blood sugar being too high or too low.  please keep a record of the readings and bring it to your next appointment here.  please call us sooner if you are having low blood sugar episodes. 3)  Please schedule a physical appointment in 6 weeks. 4)  blood tests are being ordered for you today.  please call [phone] to hear your test results. 5)  (update: i left message on phone-tree:  please verify pt takes zocor 40/d.  call us if so, so we can change rx.  stop potassium.  increase insulin as we discussed). Prescriptions: LEVEMIR FLEXPEN 100 UNIT/ML SOLN (INSULIN DETEMIR) 65 units each am, and pen needles once daily  #5 boxes x 3   Entered and Authorized by:   Minus Breeding MD   Signed by:   Minus Breeding MD on 12/22/2009   Method used:   Faxed to ...       [facility] (mail-order)             , [place]         Ph: [phone]       Fax: [phone]   RxID:   [id number]      Flu Vaccine Consent Questions     Do you have a history of severe allergic reactions to this vaccine? no    Any prior history of allergic reactions to egg and/or gelatin? no    Do you have a sensitivity to the preservative Thimersol? no    Do you have a past history of Guillan-Barre Syndrome? no    Do you currently have an acute febrile illness? no    Have you ever had a severe reaction to latex? no    Vaccine information given and explained to patient? yes    Are you currently pregnant? no    Lot Number:[id number]   Exp Date:09/23/2010   Site Given Right Deltoid IMlu

## 2010-04-25 NOTE — Progress Notes (Signed)
Summary: xray  Phone Note Call from Patient   Caller: [phone] [facility] X-ray Summary of Call: lab called requesting MD to enter orders originally sent by Dr. Felecia Jan for bilateral knee xray 2 view.  Initial call taken by: Margaret Pyle, CMA,  May 10, 2009 3:37 PM  Follow-up for Phone Call        (i am advised that this request has been cancelled) Follow-up by: Minus Breeding MD,  May 10, 2009 3:45 PM

## 2010-04-25 NOTE — Progress Notes (Signed)
Summary: tessalon  Phone Note Call from Patient   Summary of Call: Pts spouse called and stated pt is out of cough medication (Benzonatate 100mg )?Please advise? [facility] [address]. Pt also would like refills of Furosemide and Namenda called into [facility]. I will take care of those two. Initial call taken by: Josph Macho CMA,  April 12, 2009 9:39 AM  Follow-up for Phone Call        ok for tessalon - listed in meds historically but erx not done because i am not sure which pharmacy she uses (several local listed) - ok to send in once known - please let pt know same - if fever or persisiting symptoms despite meds, should come in for eval - thanks Follow-up by: Newt Lukes MD,  April 12, 2009 10:09 AM    New/Updated Medications: TESSALON PERLES 100 MG CAPS (BENZONATATE) 1 by mouth three times a day as needed for cough Prescriptions: TESSALON PERLES 100 MG CAPS (BENZONATATE) 1 by mouth three times a day as needed for cough  #30 x 1   Entered by:   Josph Macho CMA   Authorized by:   Newt Lukes MD   Signed by:   Josph Macho CMA on 04/12/2009   Method used:   Electronically to        [facility]       [address]       [place], [place]  [address]       Ph: [phone] or [phone]       Fax: [phone]   RxID:   [id number] TESSALON PERLES 100 MG CAPS (BENZONATATE) 1 by mouth three times a day as needed for cough  #30 x 1   Entered and Authorized by:   Newt Lukes MD   Signed by:   Newt Lukes MD on 04/12/2009   Method used:   Historical   RxID:   [id number] NAMENDA 10 MG  TABS (MEMANTINE HCL) TAKE 1 by mouth two times a day  #180 x 3   Entered by:   Josph Macho CMA   Authorized by:   Minus Breeding MD   Signed by:   Josph Macho CMA on 04/12/2009   Method used:   Electronically to        [facility] (mail-order)             ,          Ph: [phone]       Fax:  [phone]   RxID:   [id number] LASIX 20 MG  TABS (FUROSEMIDE) qd  #90 x 2   Entered by:   Josph Macho CMA   Authorized by:   Minus Breeding MD   Signed by:   Josph Macho CMA on 04/12/2009   Method used:   Electronically to        [facility]* (mail-order)             ,          Ph: [phone]       Fax: [phone]   RxID:   [id number]

## 2010-04-25 NOTE — Progress Notes (Signed)
  Phone Note Refill Request Message from:  Patient  Refills Requested: Medication #1:  KLOR-CON M10 10 MEQ  TBCR TAKE 1 by mouth QD   Dosage confirmed as above?Dosage Confirmed  Medication #2:  LEVEMIR FLEXPEN 100 UNIT/ML SOLN 50 units each am   Dosage confirmed as above?Dosage Confirmed Initial call taken by: Josph Macho CMA,  April 21, 2009 11:47 AM    Prescriptions: LEVEMIR FLEXPEN 100 UNIT/ML SOLN (INSULIN DETEMIR) 50 units each am  #3 box x 2   Entered by:   Josph Macho CMA   Authorized by:   Minus Breeding MD   Signed by:   Josph Macho CMA on 04/21/2009   Method used:   Electronically to        [facility] (mail-order)             ,          Ph: [phone]       Fax: [phone]   RxID:   [id number] KLOR-CON M10 10 MEQ  TBCR (POTASSIUM CHLORIDE CRYS CR) TAKE 1 by mouth QD  #90 x 2   Entered by:   Josph Macho CMA   Authorized by:   Minus Breeding MD   Signed by:   Josph Macho CMA on 04/21/2009   Method used:   Electronically to        [facility]* (mail-order)             ,          Ph: [phone]       Fax: [phone]   RxID:   [id number]

## 2010-04-25 NOTE — Assessment & Plan Note (Signed)
Summary: SUGAR OVER 200 PER SPOUSE STC   Vital Signs:  Patient profile:   77 year old female Height:      62 inches (157.48 cm) Weight:      192.50 pounds (87.50 kg) O2 Sat:      96 % on Room air Temp:     97.5 degrees F (36.39 degrees C) oral Pulse rate:   81 / minute BP sitting:   168 / 84  (left arm) Cuff size:   large  Vitals Entered By: Josph Macho RMA (June 17, 2009 2:07 PM) Taken by Sydell Axon  O2 Flow:  Room air CC: Pt states that her sugar has been running around 200. //Landa Is Patient Diabetic? Yes   Primary Provider:  Minus Breeding MD  CC:  Pt states that her sugar has been running around 200. //[place].  History of Present Illness: the status of 3 chronic medical problems is addressed today: dm:  no cbg record, but states cbg's vary from 130-200, with no trend throughout the day.   dementia:  husb and son says pt is slightly unsteady on her feet, but no falls.  they say dementia is unchanged htn:  she takes and tolerates bp meds as rx'ed  Current Medications (verified): 1)  Paroxetine Hcl 20 Mg  Tabs (Paroxetine Hcl) .... Take 1 By Mouth Qd 2)  Klor-Con M10 10 Meq  Tbcr (Potassium Chloride Crys Cr) .... Take 1 By Mouth Qd 3)  Aricept 10 Mg  Tabs (Donepezil Hcl) .... Take 1 By Mouth Qd 4)  Namenda 10 Mg  Tabs (Memantine Hcl) .... Take 1 By Mouth Two Times A Day 5)  Lasix 20 Mg  Tabs (Furosemide) .... Qd 6)  Amitriptyline Hcl 10 Mg  Tabs (Amitriptyline Hcl) .... 1/2 Qhs 7)  Vyvanse 30 Mg  Caps (Lisdexamfetamine Dimesylate) .... Take 1 Po Q Am 8)  Pen Needles 5/16" 31g X 8 Mm Misc (Insulin Pen Needle) .... Tid 9)  Levemir Flexpen 100 Unit/ml Soln (Insulin Detemir) .... 50 Units Each Am 10)  [employer] .Marland Kitchen.. 331.0 11)  Toilet Seat .Marland Kitchen.. 331.0 12)  Levothyroxine Sodium 150 Mcg Tabs (Levothyroxine Sodium) .Marland Kitchen.. 1 Qd 13)  Hydrocodone-Acetaminophen 5-500 Mg Tabs (Hydrocodone-Acetaminophen) .Marland Kitchen.. 1 Q4h As Needed Pain 14)  Xalatan Opth Soln 2.5 Ml .... At  Bedtime 15)  Aspirin 81mg  .... 1 Daily 16)  Simvastatin 40 Mg Tabs (Simvastatin) .Marland Kitchen.. 1 Qhs 17)  Benicar 40 Mg Tabs (Olmesartan Medoxomil) .Marland Kitchen.. 1 Qd 18)  Tessalon Perles 100 Mg Caps (Benzonatate) .Marland Kitchen.. 1 By Mouth Three Times A Day As Needed For Cough  Allergies (verified): No Known Drug Allergies  Past History:  Past Medical History: Last updated: 02/07/2007 Dementia Depression Diabetes mellitus, type I Hyperlipidemia Hypertension Hypothyroidism Osteoarthritis Renal insufficiency Mild restrictive lung disease by pulmanary function test in October 2006 with mildly reduce DLCO  Review of Systems  The patient denies hypoglycemia and syncope.    Physical Exam  General:  obese.  elderly, frail, no distress  Extremities:  trace right pedal edema and trace left pedal edema.     Impression & Recommendations:  Problem # 1:  DIABETES MELLITUS, TYPE I (ICD-250.01) needs increased rx  Problem # 2:  HYPERTENSION (ICD-401.9) needs increased rx  Problem # 3:  EDEMA (ICD-782.3) persistent  Problem # 4:  DEMENTIA (ICD-294.8) Assessment: Unchanged  Medications Added to Medication List This Visit: 1)  Levemir Flexpen 100 Unit/ml Soln (Insulin detemir) .... 55 units each am 2)  Furosemide  40 Mg Tabs (Furosemide) .Marland Kitchen.. 1 once daily  Other Orders: TLB-Lipid Panel (80061-LIPID) TLB-A1C / Hgb A1C (Glycohemoglobin) (83036-A1C) Est. Patient Level IV (04540)  Patient Instructions: 1)  increase levemir to 55 units each am. 2)  check your blood sugar 2 times a day.  vary the time of day when you check, between before the 3 meals, and at bedtime.  also check if you have symptoms of your blood sugar being too high or too low.  please keep a record of the readings and bring it to your next appointment here.  please call us sooner if you are having low blood sugar episodes. 3)  increase furosemide to 40 mg once daily. 4)  Please schedule a follow-up appointment in 3  months. Prescriptions: FUROSEMIDE 40 MG TABS (FUROSEMIDE) 1 once daily  #30 x 11   Entered and Authorized by:   Minus Breeding MD   Signed by:   Minus Breeding MD on 06/17/2009   Method used:   Electronically to        [facility]       [address]       [place], [place]  [address]       Ph: [phone] or [phone]       Fax: [phone]   RxID:   [id number]

## 2010-04-25 NOTE — Progress Notes (Signed)
Summary: rx request  Phone Note Refill Request Message from:  Fax from Pharmacy on November 29, 2009 11:26 AM  Refills Requested: Medication #1:  LEVEMIR FLEXPEN 100 UNIT/ML SOLN 55 units each am   Dosage confirmed as above?Dosage Confirmed  Method Requested: Fax to [facility] Initial call taken by: Brenton Grills MA,  November 29, 2009 11:27 AM    Prescriptions: LEVEMIR FLEXPEN 100 UNIT/ML SOLN (INSULIN DETEMIR) 55 units each am  #3 month x 0   Entered by:   Brenton Grills MA   Authorized by:   Minus Breeding MD   Signed by:   Brenton Grills MA on 11/29/2009   Method used:   Faxed to ...       [facility]* (retail)             ,          Ph: [phone]       Fax: [phone]   RxID:   [id number]

## 2010-04-27 ENCOUNTER — Ambulatory Visit: Admit: 2010-04-27 | Payer: Self-pay | Admitting: Physical Medicine & Rehabilitation

## 2010-04-27 ENCOUNTER — Ambulatory Visit: Payer: Medicare Other

## 2010-04-27 ENCOUNTER — Encounter: Payer: Self-pay | Admitting: Physical Medicine & Rehabilitation

## 2010-05-24 ENCOUNTER — Ambulatory Visit: Payer: Self-pay | Admitting: Physical Medicine and Rehabilitation

## 2010-05-24 ENCOUNTER — Encounter: Payer: Medicare Other | Attending: Physical Medicine and Rehabilitation

## 2010-06-05 ENCOUNTER — Ambulatory Visit: Payer: Medicare Other | Admitting: Endocrinology

## 2010-06-05 DIAGNOSIS — Z0289 Encounter for other administrative examinations: Secondary | ICD-10-CM

## 2010-06-23 ENCOUNTER — Other Ambulatory Visit: Payer: Self-pay | Admitting: Endocrinology

## 2010-06-23 ENCOUNTER — Ambulatory Visit (HOSPITAL_BASED_OUTPATIENT_CLINIC_OR_DEPARTMENT_OTHER): Payer: Medicare Other | Admitting: Physical Medicine and Rehabilitation

## 2010-06-23 ENCOUNTER — Encounter: Payer: Medicare Other | Attending: Physical Medicine and Rehabilitation

## 2010-06-23 DIAGNOSIS — M545 Low back pain, unspecified: Secondary | ICD-10-CM | POA: Insufficient documentation

## 2010-06-23 DIAGNOSIS — M79609 Pain in unspecified limb: Secondary | ICD-10-CM | POA: Insufficient documentation

## 2010-06-23 DIAGNOSIS — K5900 Constipation, unspecified: Secondary | ICD-10-CM | POA: Insufficient documentation

## 2010-06-23 DIAGNOSIS — Z79899 Other long term (current) drug therapy: Secondary | ICD-10-CM | POA: Insufficient documentation

## 2010-06-28 ENCOUNTER — Other Ambulatory Visit: Payer: Self-pay | Admitting: Physical Medicine and Rehabilitation

## 2010-06-28 DIAGNOSIS — M545 Low back pain: Secondary | ICD-10-CM

## 2010-06-28 DIAGNOSIS — M79659 Pain in unspecified thigh: Secondary | ICD-10-CM

## 2010-07-01 ENCOUNTER — Other Ambulatory Visit: Payer: Self-pay | Admitting: Endocrinology

## 2010-07-05 MED ORDER — MEMANTINE HCL 10 MG PO TABS: 10.0000 mg | ORAL_TABLET | Freq: Two times a day (BID) | ORAL | Status: DC

## 2010-07-05 NOTE — Telephone Encounter (Signed)
Addended by: Brenton Grills on: 07/05/2010 08:53 AM   Modules accepted: Orders

## 2010-07-05 NOTE — Telephone Encounter (Signed)
R'cd error from rx sent on 07/02/2009. Resent rx.

## 2010-07-06 ENCOUNTER — Ambulatory Visit (HOSPITAL_COMMUNITY)
Admission: RE | Admit: 2010-07-06 | Discharge: 2010-07-06 | Disposition: A | Discharge status: Home / Self Care | Payer: Medicare Other | Source: Ambulatory Visit | Attending: Physical Medicine and Rehabilitation | Admitting: Physical Medicine and Rehabilitation

## 2010-07-06 DIAGNOSIS — M79659 Pain in unspecified thigh: Secondary | ICD-10-CM

## 2010-07-06 DIAGNOSIS — M79609 Pain in unspecified limb: Secondary | ICD-10-CM | POA: Insufficient documentation

## 2010-07-06 DIAGNOSIS — M25559 Pain in unspecified hip: Secondary | ICD-10-CM | POA: Insufficient documentation

## 2010-07-06 DIAGNOSIS — M545 Low back pain, unspecified: Secondary | ICD-10-CM | POA: Insufficient documentation

## 2010-07-06 DIAGNOSIS — M47817 Spondylosis without myelopathy or radiculopathy, lumbosacral region: Secondary | ICD-10-CM | POA: Insufficient documentation

## 2010-07-14 ENCOUNTER — Ambulatory Visit: Payer: Medicare Other | Admitting: Physical Medicine and Rehabilitation

## 2010-07-20 ENCOUNTER — Other Ambulatory Visit (INDEPENDENT_AMBULATORY_CARE_PROVIDER_SITE_OTHER): Payer: Medicare Other

## 2010-07-20 ENCOUNTER — Encounter: Payer: Self-pay | Admitting: Endocrinology

## 2010-07-20 ENCOUNTER — Ambulatory Visit (INDEPENDENT_AMBULATORY_CARE_PROVIDER_SITE_OTHER): Payer: Medicare Other | Admitting: Endocrinology

## 2010-07-20 DIAGNOSIS — R9431 Abnormal electrocardiogram [ECG] [EKG]: Secondary | ICD-10-CM

## 2010-07-20 DIAGNOSIS — E785 Hyperlipidemia, unspecified: Secondary | ICD-10-CM

## 2010-07-20 DIAGNOSIS — K7689 Other specified diseases of liver: Secondary | ICD-10-CM

## 2010-07-20 DIAGNOSIS — E039 Hypothyroidism, unspecified: Secondary | ICD-10-CM

## 2010-07-20 DIAGNOSIS — Z0000 Encounter for general adult medical examination without abnormal findings: Secondary | ICD-10-CM

## 2010-07-20 DIAGNOSIS — E109 Type 1 diabetes mellitus without complications: Secondary | ICD-10-CM

## 2010-07-20 DIAGNOSIS — I10 Essential (primary) hypertension: Secondary | ICD-10-CM

## 2010-07-20 DIAGNOSIS — F068 Other specified mental disorders due to known physiological condition: Secondary | ICD-10-CM

## 2010-07-20 DIAGNOSIS — N259 Disorder resulting from impaired renal tubular function, unspecified: Secondary | ICD-10-CM

## 2010-07-20 LAB — HEPATIC FUNCTION PANEL
AST: 45 U/L — ABNORMAL HIGH (ref 0–37)
Alkaline Phosphatase: 113 U/L (ref 39–117)
Total Bilirubin: 0.5 mg/dL (ref 0.3–1.2)

## 2010-07-20 LAB — LIPID PANEL
HDL: 62.8 mg/dL (ref >39.00)
Total CHOL/HDL Ratio: 3

## 2010-07-20 LAB — BASIC METABOLIC PANEL
BUN: 18 mg/dL (ref 6–23)
CO2: 36 mEq/L — ABNORMAL HIGH (ref 19–32)
Calcium: 9.3 mg/dL (ref 8.4–10.5)
Creatinine, Ser: 1.4 mg/dL — ABNORMAL HIGH (ref 0.4–1.2)
Glucose, Bld: 251 mg/dL — ABNORMAL HIGH (ref 70–99)

## 2010-07-20 LAB — TSH: TSH: 6.69 u[IU]/mL — ABNORMAL HIGH (ref 0.35–5.50)

## 2010-07-20 LAB — CBC WITH DIFFERENTIAL/PLATELET
Basophils Absolute: 0 10*3/uL (ref 0.0–0.1)
HCT: 42.6 % (ref 36.0–46.0)
Hemoglobin: 14.3 g/dL (ref 12.0–15.0)
Lymphs Abs: 1.6 10*3/uL (ref 0.7–4.0)
Monocytes Relative: 5.5 % (ref 3.0–12.0)
Neutro Abs: 6.5 10*3/uL (ref 1.4–7.7)
RDW: 13.6 % (ref 11.5–14.6)

## 2010-07-20 MED ORDER — INSULIN GLARGINE 100 UNIT/ML ~~LOC~~ SOLN: 65.0000 [IU] | Freq: Every day | SUBCUTANEOUS | Status: DC

## 2010-07-20 MED ORDER — LEVOTHYROXINE SODIUM 175 MCG PO TABS: 175.0000 ug | ORAL_TABLET | Freq: Every day | ORAL | Status: DC

## 2010-07-20 MED ORDER — LOSARTAN POTASSIUM 100 MG PO TABS: 100.0000 mg | ORAL_TABLET | Freq: Every day | ORAL | Status: DC

## 2010-07-20 NOTE — Patient Instructions (Addendum)
blood tests are being ordered for you today.  please call [phone] to hear your test results. pending the test results, please change levemir to lantus, 65 units each morning. please consider these measures for your health:  minimize alcohol.  do not use tobacco products.  have a colonoscopy at least every 10 years from age 75.  keep firearms safely stored.  always use seat belts.  have working smoke alarms in your home.  see an eye doctor and dentist regularly.  never drive under the influence of alcohol or drugs (including prescription drugs).   please let me know what your wishes would be, if artificial life support measures should become necessary.  it is critically important to prevent falling down (keep floor areas well-lit, dry, and free of loose objects). [facility] and [facility] for a mammogram. Please recheck "echo" (a painless and easy heart test).  you will be called with a day and time for an appointment. Please make a follow-up appointment in 3 months check your blood sugar 2 times a day.  vary the time of day when you check, between before the 3 meals, and at bedtime.  also check if you have symptoms of your blood sugar being too high or too low.  please keep a record of the readings and bring it to your next appointment here.  please call us sooner if you are having low blood sugar episodes. good diet and exercise habits significanly improve the control of your diabetes.  please let me know if you wish to be referred to a dietician.  high blood sugar is very risky to your health.  you should see an eye doctor every year. controlling your blood pressure and cholesterol drastically reduces the damage diabetes does to your body.  this also applies to quitting smoking.  please discuss these with your doctor.  you should take an aspirin every day, unless you have been advised by a doctor not to. Change benicar to losartan 100 mg daily.   (update: i left message  on phone-tree:  Increase synthroid to 175 mcg/d).

## 2010-07-20 NOTE — Progress Notes (Signed)
Subjective:    Patient ID: Shannon Charles, female    DOB: [birth year], 77 y.o.   MRN: [id number]  HPI here for regular wellness examination.  Husband says pt's memory loss continues to worsen.  Other chronic med probs are stable, except as noted below. Past Medical History  Diagnosis Date  . DEMENTIA 08/23/2006  . DEPRESSION 08/23/2006  . DIABETES MELLITUS, TYPE I 08/23/2006  . HYPERLIPIDEMIA 08/23/2006  . HYPERTENSION 08/23/2006  . HYPOTHYROIDISM 08/23/2006  . OSTEOARTHRITIS 08/23/2006  . RENAL INSUFFICIENCY 08/23/2006  . Personal history of colonic polyps 04/29/2007  . Lung disease, restrictive     Mild, by pulmonary function test in October 2006 with mildly reduce DLCO   No past surgical history on file.  reports that she has never smoked. She does not have any smokeless tobacco history on file. Her alcohol and drug histories not on file. Married Retired. family history includes Cancer in her sister. No Known Allergies   Review of Systems  Constitutional: Negative for fever.       Weight gain persists  HENT: Negative for hearing loss.   Eyes: Negative for visual disturbance.  Respiratory: Negative for shortness of breath.   Cardiovascular: Negative for chest pain.  Gastrointestinal: Negative for blood in stool.  Musculoskeletal:       [She ambulates well with walker Skin: Negative for rash.  Neurological: Positive for headaches.       Occasional headache  Psychiatric/Behavioral: The patient is not nervous/anxious.        Objective:   Physical Exam VS: see vs page GEN: no distress.  obese HEAD: head: no deformity eyes: no periorbital swelling, no proptosis external nose and ears are normal mouth: no lesion seen NECK: supple, thyroid is not enlarged CHEST WALL: no deformity BREASTS:  No mass.  No d/c CV: reg rate and rhythm, no murmur ABD: abdomen is soft, nontender.  no hepatosplenomegaly.  not distended.  no hernia MUSCULOSKELETAL: muscle bulk and strength are grossly  normal.  no obvious joint swelling.  gait is normal and steady EXTEMITIES: no deformity.  no ulcer on the feet.  feet are of normal color and temp.  no edema.  There re varicosities on the feet. NEURO:  cn 2-12 grossly intact.   readily moves all 4's.  sensation is intact to touch on the feet. SKIN:  Normal texture and temperature.  No rash or suspicious lesion is visible.   NODES:  None palpable at the neck PSYCH: alert, oriented to self, place, and 2012.  Does not appear anxious nor depressed.      SEPARATE EVALUATION FOLLOWS--EACH PROBLEM HERE IS NEW, NOT RESPONDING TO TREATMENT, OR POSES SIGNIFICANT RISK TO THE PATIENT'S HEALTH: HISTORY OF THE PRESENT ILLNESS: The state of at least three ongoing medical problems is addressed today: Htn:  She takes benicar as rx'ed, but husb. wants to change to a cheaper med. Hypothyoidism: she takes synthroid as rx'ed.  Memory loss continues to worsen, per husband. Hypokalemia: Pt ran out of klor 10 days ago.  Family did not stop in 2011. Dm: no cbg record, but husband says it is in the 60's in the afternoon.  It is highest in am, it is sometimes over 200.  Denies loc PAST MEDICAL HISTORY reviewed and up to date today REVIEW OF SYSTEMS: Denies easy bruising and hematuria PHYSICAL EXAMINATION:   LUNGS:  Clear to auscultation. RECTAL: normal external and internal exam.  heme neg. Pulses: dorsalis pedis intact bilat.  no carotid bruit. LAB/XRAY  RESULTS: Lab Results  Component Value Date   WBC 8.7 07/20/2010   HGB 14.3 07/20/2010   HCT 42.6 07/20/2010   PLT 208.0 07/20/2010   CHOL 166 07/20/2010   TRIG 117.0 07/20/2010   HDL 62.80 07/20/2010   LDLDIRECT 174.8 12/22/2009   ALT 36* 07/20/2010   AST 45* 07/20/2010   NA 143 07/20/2010   K 4.2 07/20/2010   CL 101 07/20/2010   CREATININE 1.4* 07/20/2010   BUN 18 07/20/2010   CO2 36* 07/20/2010   TSH 6.69* 07/20/2010   HGBA1C 8.8* 07/20/2010   MICROALBUR 3.4* 07/20/2010   IMPRESSION: Htn.  Pt wants to change to  a cheaper med Hypothyroidism, needs increased rx Hypokalemia, no med is needed now PLAN: See instruction page   Assessment & Plan:

## 2010-07-25 ENCOUNTER — Telehealth: Payer: Self-pay | Admitting: *Deleted

## 2010-07-25 ENCOUNTER — Encounter: Payer: Medicare Other | Attending: Physical Medicine and Rehabilitation

## 2010-07-25 DIAGNOSIS — M545 Low back pain, unspecified: Secondary | ICD-10-CM | POA: Insufficient documentation

## 2010-07-25 DIAGNOSIS — K5900 Constipation, unspecified: Secondary | ICD-10-CM | POA: Insufficient documentation

## 2010-07-25 DIAGNOSIS — M79609 Pain in unspecified limb: Secondary | ICD-10-CM | POA: Insufficient documentation

## 2010-07-25 DIAGNOSIS — Z79899 Other long term (current) drug therapy: Secondary | ICD-10-CM | POA: Insufficient documentation

## 2010-07-25 NOTE — Telephone Encounter (Signed)
Last week's labs said this med is no longer needed.  Please d/c it.

## 2010-07-25 NOTE — Telephone Encounter (Signed)
Pt req rf on Klor Con. 1 po ad. Med is not active in [facility].  She needs # 30 sent to [facility] and # 90 sent to [facility].      Please advise ok Rf?

## 2010-07-26 ENCOUNTER — Encounter (HOSPITAL_BASED_OUTPATIENT_CLINIC_OR_DEPARTMENT_OTHER): Payer: Medicare Other

## 2010-07-26 DIAGNOSIS — R279 Unspecified lack of coordination: Secondary | ICD-10-CM

## 2010-07-26 DIAGNOSIS — M545 Low back pain, unspecified: Secondary | ICD-10-CM

## 2010-07-26 DIAGNOSIS — G894 Chronic pain syndrome: Secondary | ICD-10-CM

## 2010-07-26 DIAGNOSIS — M79609 Pain in unspecified limb: Secondary | ICD-10-CM

## 2010-07-26 NOTE — Telephone Encounter (Signed)
Pt's phone number has been disconnected. Letter stating same mailed to address in file

## 2010-07-31 ENCOUNTER — Other Ambulatory Visit (HOSPITAL_COMMUNITY): Payer: Medicare Other | Admitting: Radiology

## 2010-08-11 ENCOUNTER — Ambulatory Visit (HOSPITAL_COMMUNITY): Payer: Medicare Other | Attending: Endocrinology | Admitting: Radiology

## 2010-08-11 DIAGNOSIS — R9431 Abnormal electrocardiogram [ECG] [EKG]: Secondary | ICD-10-CM | POA: Insufficient documentation

## 2010-08-11 DIAGNOSIS — E119 Type 2 diabetes mellitus without complications: Secondary | ICD-10-CM | POA: Insufficient documentation

## 2010-08-11 DIAGNOSIS — I10 Essential (primary) hypertension: Secondary | ICD-10-CM | POA: Insufficient documentation

## 2010-08-11 DIAGNOSIS — E785 Hyperlipidemia, unspecified: Secondary | ICD-10-CM | POA: Insufficient documentation

## 2010-08-11 NOTE — Op Note (Signed)
NAME:  Shannon Charles, Shannon Charles                          ACCOUNT NO.:  [id number]   MEDICAL RECORD NO.:  [id number]                   PATIENT TYPE:  AMB   LOCATION:  ENDO                                 FACILITY:  [facility]   PHYSICIAN:  Anselmo Rod, M.D.               DATE OF BIRTH:  [birth year]   DATE OF PROCEDURE:  01/14/2002  DATE OF DISCHARGE:                                 OPERATIVE REPORT   PROCEDURE:  Colonoscopy with snare polypectomy x1.   ENDOSCOPIST:  Anselmo Rod, M.D.   INSTRUMENT USED:  Olympus video colonoscope.   INDICATION FOR PROCEDURE:  Guaiac-positive stool in a 77 year old African-  American female. Rule out colonic polyps, masses, etc.   PREPROCEDURE PREPARATION:  Informed consent was procured from the patient.  The patient was fasted for eight hours prior to the procedure and prepped  with a bottle of magnesium citrate and a gallon of NuLytely the night prior  to the procedure.   PREPROCEDURE PHYSICAL:  VITAL SIGNS:  The patient had stable vital signs.  NECK:  Supple.  CHEST:  Clear to auscultation.  S1, S2 regular.  ABDOMEN:  Soft with normal bowel sounds.   DESCRIPTION OF PROCEDURE:  The patient was placed in the left lateral  decubitus position and sedated with 60 mg of Demerol and 5 mg of Versed  intravenously.  Once the patient was adequately sedate and maintained on low-  flow oxygen and continuous cardiac monitoring, the Olympus video colonoscope  was advanced from the rectum to the cecum with slight difficulty.  There was  a significant amount of residual stool in the colon.  Multiple washes were  done.  Small internal hemorrhoids were seen on retroflexion.  A flat polyp  was snared from the mid-right colon.  No large masses, polyps, erosions,  ulcerations, or diverticula were seen.  The patient tolerated the procedure  well without complications.  The appendiceal orifice and the ileocecal valve  were clearly visualized and photographed.   IMPRESSION:  1. Small, nonbleeding internal hemorrhoid.  2. Flat polyp snared from mid-right colon.  3. Some residual stool in the colon.  4. No evidence of large masses or polyps.  5. No evidence of diverticulosis.   RECOMMENDATIONS:  1. Await pathology results.  2.     Avoid all nonsteroidals, including aspirin, for the next three to four     weeks.  3. Outpatient follow-up in the next two weeks or earlier if need be for     repeat guaiac testing and to discuss pathology results.                                                 Anselmo Rod, M.D.    JNM/MEDQ  D:  01/14/2002  T:  01/14/2002  Job:  [id number]   cc:   Merlene Laughter. Renae Gloss, M.D.

## 2010-08-18 ENCOUNTER — Encounter
Payer: Medicare Other | Attending: Physical Medicine and Rehabilitation | Admitting: Physical Medicine and Rehabilitation

## 2010-08-18 DIAGNOSIS — R279 Unspecified lack of coordination: Secondary | ICD-10-CM | POA: Insufficient documentation

## 2010-08-18 DIAGNOSIS — M545 Low back pain, unspecified: Secondary | ICD-10-CM

## 2010-08-18 DIAGNOSIS — E119 Type 2 diabetes mellitus without complications: Secondary | ICD-10-CM | POA: Insufficient documentation

## 2010-08-18 DIAGNOSIS — M47817 Spondylosis without myelopathy or radiculopathy, lumbosacral region: Secondary | ICD-10-CM | POA: Insufficient documentation

## 2010-08-18 DIAGNOSIS — G894 Chronic pain syndrome: Secondary | ICD-10-CM

## 2010-08-18 DIAGNOSIS — Z79899 Other long term (current) drug therapy: Secondary | ICD-10-CM | POA: Insufficient documentation

## 2010-08-18 DIAGNOSIS — M1710 Unilateral primary osteoarthritis, unspecified knee: Secondary | ICD-10-CM

## 2010-08-18 DIAGNOSIS — E669 Obesity, unspecified: Secondary | ICD-10-CM | POA: Insufficient documentation

## 2010-08-18 DIAGNOSIS — R609 Edema, unspecified: Secondary | ICD-10-CM | POA: Insufficient documentation

## 2010-08-18 DIAGNOSIS — N289 Disorder of kidney and ureter, unspecified: Secondary | ICD-10-CM | POA: Insufficient documentation

## 2010-08-18 DIAGNOSIS — M25569 Pain in unspecified knee: Secondary | ICD-10-CM | POA: Insufficient documentation

## 2010-08-19 NOTE — Assessment & Plan Note (Signed)
[name] is a pleasant 77 year old woman who is followed here at [facility] for chronic pain complaints predominately related to her low back and knees.  She is back in today for refill of her pain medication.  She is accompanied by her grandson again.  Her average pain is about 8 on a scale of 10.  However today she reports she is doing rather well with very minimal pain in her low back or her knees.  She indicates that pain interferes very little with activity level.  She reports fair to good relief with medication she is currently taking.  There is some concern per her grandson about her gait instability.  Functional status.  She uses a cane or a walker.  She can walk a few minutes.  She is independent with feeding, dressing, bathing, toileting, light meal prep.  Denies problems controlling bowel or bladder.  Review of systems, variations in blood sugar followed by Dr. Everardo All.  She has also had recent echocardiogram.  Results are not available at this point.  Past medical, social, family history otherwise unchanged other than that already noted.  [facility] include not more than 3 Percocet per day, 5/325 dosage and gabapentin 300 mg 4 times a day.  Exam; blood pressure is 177/93, pulse 83, respirations 18, 93% saturated on room air.  She is a well-developed, well-nourished woman who appears her stated age and does not appear in any distress.  She is oriented x3. Speech is clear.  Affect is bright.  She is alert, cooperative and pleasant.  Follows commands without difficulty, answers my questions appropriately.  Cranial nerves and coordination are intact.  Her reflexes are diminished in the lower extremities.  No abnormal tone, clonus or tremors are noted.  She has good strength in both upper and lower extremities.  She is able to transition from sitting to standing easily.  Gait is slow.  Tandem  gait she has some mild difficulty with. Romberg test is performed adequately.  No sensory deficits are appreciated.  Internal and external rotation at the hips does not aggravate her pain. She has no pain today with flexion and extension of her knees, although I do feel crepitus at the patellofemoral joint in both knees.  She has mild limitations in lumbar motion without pain as well.  IMPRESSION: 1. Lumbago with known mild lumbar spondylosis per MRI which was done     July 06, 2010.  Results are reviewed with the patient. 2. Bilateral knee pain intermittent.  Currently, the patient is not     complaining of knee pain. 3. Mild balance disorder.  PLAN:  We will decrease her gabapentin from 300 mg 4 times a day down to 300 mg 3 times a day.  We will continue her on Percocet 5/325 half a tablet at 9:00 a.m., half a tablet at 1:00 p.m., half a tablet at 6:00 p.m. and 1 at 10:00 p.m.  Her medical problems include intermittent edema, hyponatremia, renal insufficiency, fatty liver, diabetes, obesity, dyslipidemia, a recent echocardiogram followed by Dr. Everardo All.  I have answered all her questions and comfortable with our plan at this time.  She has been stable on the above medications.  I have asked her to make sure she brings in her bottles each time so that we can obtain correct fill dates as well as to do our pill counts.  She states she will comply this.     Terris Bodin  Fredrich Birks, M.D.    DMK/MedQ D:  08/18/2010 13:23:46  T:  08/19/2010 02:52:13  Job #:  [id number]

## 2010-08-23 NOTE — Assessment & Plan Note (Signed)
Shannon Charles is an 77 year old woman, who is accompanied by her husband today.  She has a chief complaint of low back pain and bilateral leg pain, which is worse when she is up walking.  She is here for a refill of her pain medications today.  She typically has been using not more than three Percocet tablets per day, 5/325 half a tablet at 10 a.m., full tablet at 2 p.m. half a tablet at 6 p.m., and full tablet at 10 p.m., gabapentin 300 mg 4 times a day.  She reports fair relief with this medication.  FUNCTIONAL STATUS:  She can walk about 10 minutes at a time.  She has difficulty with stairs.  She is independent with self-care.  However, she does not participate in the higher level household tasks.  She typically uses a cane or a walker for ambulation.  She reports some problems with constipation.  She has been using Metamucil on a p.r.n. basis for this.  PRIMARY CARE PHYSICIAN:  Dr. Everardo All.  She maintains contact.  Past medical, social, family history otherwise unchanged.  PHYSICAL EXAMINATION:  VITAL SIGNS:  Blood pressure is 154/80, pulse 81, respirations 18, 96% saturation on room air. NEUROLOGIC:  She is a well-developed, obese, African American woman who does not appear in any distress.  She is oriented x3.  Her speech is clear.  Her affect is somewhat flat.  She follows commands without difficulty.  She answers my questions.  She does look to order husband to answer some of her questions however.  Her cranial nerves and coordination are intact.  Her reflexes are 1+ in upper and lower extremities.  No abnormal tone, clonus, or tremors are noted.  Motor strength is generally good transitioning from sitting to standing done slowly.  Her gait is mildly unstable.  Tandem gait, she has difficulty with Romberg test, she performs adequately.  She has limitations in lumbar motion in all planes.  No pain with internal or external rotation at the hips or knees today.  She reports no  sensory deficits.  She has some mild weakness with hip flexion, however.  IMPRESSION AND PLAN:  Lumbago with bilateral leg pain especially with walking with limited ambulation capacity.  Consider MRI to further evaluate lumbar spine and possible stenosis.  I have given her a walking log again to document her daily walking, brought in her walking logs from the last 2 months.  She has not been documenting it for me.  I have asked her to try this again over the next couple of months.  She states she will comply.  I have refilled her pain medication Percocet 5/225 half a tablet at 9 a.m. one at 1 p.m., half one at 6 p.m. and 1 p.o. at 10 p.m. i answered all her questions, they are comfortable with our plan.     Brantley Stage, M.D. Electronically Signed    DMK/MedQ D:  06/23/2010 14:09:29  T:  06/24/2010 05:45:15  Job #:  [id number]

## 2010-09-08 ENCOUNTER — Other Ambulatory Visit: Payer: Self-pay | Admitting: Endocrinology

## 2010-09-13 ENCOUNTER — Encounter: Payer: Medicare Other | Attending: Physical Medicine and Rehabilitation

## 2010-09-13 DIAGNOSIS — M79609 Pain in unspecified limb: Secondary | ICD-10-CM

## 2010-09-13 DIAGNOSIS — R279 Unspecified lack of coordination: Secondary | ICD-10-CM

## 2010-09-13 DIAGNOSIS — M545 Low back pain, unspecified: Secondary | ICD-10-CM | POA: Insufficient documentation

## 2010-09-13 DIAGNOSIS — G894 Chronic pain syndrome: Secondary | ICD-10-CM

## 2010-09-13 DIAGNOSIS — M47817 Spondylosis without myelopathy or radiculopathy, lumbosacral region: Secondary | ICD-10-CM | POA: Insufficient documentation

## 2010-09-13 DIAGNOSIS — M25569 Pain in unspecified knee: Secondary | ICD-10-CM | POA: Insufficient documentation

## 2010-09-21 ENCOUNTER — Telehealth: Payer: Self-pay

## 2010-09-21 MED ORDER — INSULIN GLARGINE 100 UNIT/ML ~~LOC~~ SOLN: 65.0000 [IU] | SUBCUTANEOUS | Status: DC

## 2010-09-21 NOTE — Telephone Encounter (Signed)
Pt's spouse called stating Insulin flex pen work better for patient. Pt is requesting Rx for 30 day supply to [facility] [place] and a 90 day with refills to [facility]. Please advise.

## 2010-09-21 NOTE — Telephone Encounter (Signed)
sent 

## 2010-09-21 NOTE — Telephone Encounter (Signed)
Pt's spouse advised of refills.

## 2010-10-19 ENCOUNTER — Telehealth: Payer: Self-pay | Admitting: Endocrinology

## 2010-10-19 ENCOUNTER — Other Ambulatory Visit (INDEPENDENT_AMBULATORY_CARE_PROVIDER_SITE_OTHER): Payer: Medicare Other

## 2010-10-19 ENCOUNTER — Encounter: Payer: Self-pay | Admitting: Endocrinology

## 2010-10-19 ENCOUNTER — Ambulatory Visit (INDEPENDENT_AMBULATORY_CARE_PROVIDER_SITE_OTHER): Payer: Medicare Other | Admitting: Endocrinology

## 2010-10-19 DIAGNOSIS — E109 Type 1 diabetes mellitus without complications: Secondary | ICD-10-CM

## 2010-10-19 DIAGNOSIS — N259 Disorder resulting from impaired renal tubular function, unspecified: Secondary | ICD-10-CM

## 2010-10-19 DIAGNOSIS — E039 Hypothyroidism, unspecified: Secondary | ICD-10-CM

## 2010-10-19 DIAGNOSIS — E785 Hyperlipidemia, unspecified: Secondary | ICD-10-CM

## 2010-10-19 LAB — LIPID PANEL
LDL Cholesterol: 74 mg/dL (ref 0–99)
VLDL: 22.2 mg/dL (ref 0.0–40.0)

## 2010-10-19 LAB — BASIC METABOLIC PANEL
Chloride: 103 mEq/L (ref 96–112)
GFR: 48.01 mL/min — ABNORMAL LOW (ref >60.00)
Potassium: 4 mEq/L (ref 3.5–5.1)
Sodium: 142 mEq/L (ref 135–145)

## 2010-10-19 MED ORDER — LEVOTHYROXINE SODIUM 200 MCG PO TABS: 200.0000 ug | ORAL_TABLET | Freq: Every day | ORAL | Status: DC

## 2010-10-19 MED ORDER — INSULIN GLARGINE 100 UNIT/ML ~~LOC~~ SOLN: 75.0000 [IU] | SUBCUTANEOUS | Status: DC

## 2010-10-19 NOTE — Progress Notes (Signed)
Subjective:    Patient ID: Shannon Charles, female    DOB: 1927/07/30, 77 y.o.   MRN: [id number]  HPI The state of at least three ongoing medical problems is addressed today: Pt says she feels no different since the synthroid was increased. she brings a record of her cbg's which i have reviewed today.  It varies from 63 (before evening meal) to 200's.  Most are in the 100's.  Pt is here with grandson, who was unaware of the cbg of 26, or its circumstances.  Pt is also unaware.   She takes zocor as rx'ed. Past Medical History  Diagnosis Date  . DEMENTIA 08/23/2006  . DEPRESSION 08/23/2006  . DIABETES MELLITUS, TYPE I 08/23/2006  . HYPERLIPIDEMIA 08/23/2006  . HYPERTENSION 08/23/2006  . HYPOTHYROIDISM 08/23/2006  . OSTEOARTHRITIS 08/23/2006  . RENAL INSUFFICIENCY 08/23/2006  . Personal history of colonic polyps 04/29/2007  . Lung disease, restrictive     Mild, by pulmonary function test in October 2006 with mildly reduce DLCO    No past surgical history on file.  History   Social History  . Marital Status: Married    Spouse Name: N/A    Number of Children: N/A  . Years of Education: N/A   Occupational History  . Not on file.   Social History Main Topics  . Smoking status: Never Smoker   . Smokeless tobacco: Not on file  . Alcohol Use: Not on file  . Drug Use: Not on file  . Sexually Active: Not on file   Other Topics Concern  . 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 amitriptyline (ELAVIL) 10 MG tablet 1/2 tablet at bedtime       . donepezil (ARICEPT) 10 MG tablet Take 10 mg by mouth daily.        . furosemide (LASIX) 40 MG tablet TAKE 1 TABLET DAILY  90 tablet  1  . levothyroxine (SYNTHROID, LEVOTHROID) 175 MCG tablet Take 1 tablet (175 mcg total) by mouth daily.  30 tablet  11  . lisdexamfetamine (VYVANSE) 30 MG capsule Take 30 mg  by mouth every morning.        Marland Kitchen losartan (COZAAR) 100 MG tablet Take 1 tablet (100 mg total) by mouth daily.  90 tablet  3  . memantine (NAMENDA) 10 MG tablet Take 1 tablet (10 mg total) by mouth 2 (two) times daily.  180 tablet  1  . simvastatin (ZOCOR) 40 MG tablet TAKE 1 TABLET AT BEDTIME (DUE FOR PHYSICAL EXAM WITH MD)  90 tablet  2  . HYDROcodone-acetaminophen (VICODIN) 5-500 MG per tablet Take 1 tablet by mouth every 4 (four) hours as needed. For pain       . insulin glargine (LANTUS SOLOSTAR) 100 UNIT/ML injection Inject 65 Units into the skin every morning. And pen needles, 1 daily  60 mL  3    No Known Allergies  Family History  Problem Relation Age of Onset  . Cancer Sister     uncertain type   BP 142/82  Pulse 90  Temp(Src) 98.1 F (36.7 C) (Oral)  Ht 5\' 2"  (1.575 m)  Wt 195 lb 6.4 oz (88.633 kg)  BMI 35.74 kg/m2  SpO2 92%  Review of Systems Denies loc.  She has gained a few lbs    Objective:   Physical Exam  GENERAL: no distress Gait: slow but steady, with a cane     Assessment & Plan:  Dm, therapy limited by pt's need for a simple regimen. Dyslipidemia.  Pt and grandson say she is back on rx Renal insuff, chronic Htn, which may have a situational component. Hypothyroidism, now on increased rx.

## 2010-10-19 NOTE — Telephone Encounter (Signed)
i left message on phone tree Increase lantus to 75/d Increase synthroid to 200/d

## 2010-10-19 NOTE — Patient Instructions (Addendum)
blood tests are being ordered for you today.  please call [phone] to hear your test results.  You will be prompted to enter the 9-digit "MRN" number that appears at the top left of this page, followed by #.  Then you will hear the message. check your blood sugar  times a day.  vary the time of day when you check, between before the 3 meals, and at bedtime.  also check if you have symptoms of your blood sugar being too high or too low.  please keep a record of the readings and bring it to your next appointment here.  please call us sooner if you are having low blood sugar episodes.  If it is below 100, please write on the paper why you think this has happened.   Please continue the same blood pressure medication for now.  We'll recheck when you return. Please make a follow-up appointment in 3 months.

## 2010-10-25 ENCOUNTER — Encounter
Payer: Medicare Other | Attending: Physical Medicine and Rehabilitation | Admitting: Physical Medicine and Rehabilitation

## 2010-10-25 DIAGNOSIS — M545 Low back pain, unspecified: Secondary | ICD-10-CM | POA: Insufficient documentation

## 2010-10-25 DIAGNOSIS — M25519 Pain in unspecified shoulder: Secondary | ICD-10-CM | POA: Insufficient documentation

## 2010-10-25 DIAGNOSIS — R279 Unspecified lack of coordination: Secondary | ICD-10-CM | POA: Insufficient documentation

## 2010-10-25 DIAGNOSIS — M47817 Spondylosis without myelopathy or radiculopathy, lumbosacral region: Secondary | ICD-10-CM | POA: Insufficient documentation

## 2010-10-25 DIAGNOSIS — M25569 Pain in unspecified knee: Secondary | ICD-10-CM | POA: Insufficient documentation

## 2010-10-25 DIAGNOSIS — G894 Chronic pain syndrome: Secondary | ICD-10-CM

## 2010-10-31 ENCOUNTER — Other Ambulatory Visit: Payer: Self-pay | Admitting: *Deleted

## 2010-10-31 MED ORDER — INSULIN GLARGINE 100 UNIT/ML ~~LOC~~ SOLN: 75.0000 [IU] | SUBCUTANEOUS | Status: DC

## 2010-10-31 NOTE — Telephone Encounter (Signed)
Pt's husband called requesting new rx for Lantus insulin be sent to [facility]. Pt's spouse informed.

## 2010-11-06 ENCOUNTER — Other Ambulatory Visit: Payer: Self-pay | Admitting: *Deleted

## 2010-11-06 MED ORDER — FUROSEMIDE 40 MG PO TABS: 40.0000 mg | ORAL_TABLET | Freq: Every day | ORAL | Status: DC

## 2010-11-06 NOTE — Telephone Encounter (Signed)
R'cd phone call from spouse that pt needs a new rx for Furosemide to go to [facility].

## 2010-11-21 ENCOUNTER — Other Ambulatory Visit: Payer: Self-pay

## 2010-11-21 MED ORDER — LEVOTHYROXINE SODIUM 200 MCG PO TABS: 200.0000 ug | ORAL_TABLET | Freq: Every day | ORAL | Status: DC

## 2010-11-24 ENCOUNTER — Encounter
Payer: Medicare Other | Attending: Physical Medicine and Rehabilitation | Admitting: Physical Medicine and Rehabilitation

## 2010-11-24 DIAGNOSIS — M25519 Pain in unspecified shoulder: Secondary | ICD-10-CM | POA: Insufficient documentation

## 2010-11-24 DIAGNOSIS — M545 Low back pain, unspecified: Secondary | ICD-10-CM | POA: Insufficient documentation

## 2010-11-24 DIAGNOSIS — R279 Unspecified lack of coordination: Secondary | ICD-10-CM

## 2010-11-24 DIAGNOSIS — G894 Chronic pain syndrome: Secondary | ICD-10-CM

## 2010-11-24 DIAGNOSIS — M25569 Pain in unspecified knee: Secondary | ICD-10-CM | POA: Insufficient documentation

## 2010-11-24 DIAGNOSIS — M47817 Spondylosis without myelopathy or radiculopathy, lumbosacral region: Secondary | ICD-10-CM | POA: Insufficient documentation

## 2010-11-24 DIAGNOSIS — M79609 Pain in unspecified limb: Secondary | ICD-10-CM

## 2010-11-24 NOTE — Assessment & Plan Note (Signed)
[name] is a pleasant 77 year old woman who is accompanied by her husband today.  She has [facility] to [facility] for a brief recheck and refill of her pain medications.  Her average pain is about 8 on a scale of 10.  She is complaining of some knee pain as well as particularly right shoulder pain which came on over the last couple of days.  She does not really remember anything that aggravated it, but she does have a history of Alzheimer also.  Her average pain is about 8.  She reports good relief with current meds. Her husband tells me that she is having a bit more pain toward about 8 o'clock at night.  He also tells me that she typically takes her pain medication instead of at 6:00 p.m. when she gets done eating at about 7.  No changes in functional status.  No changes on review of systems.  Past medical, social, family history are otherwise unchanged as well.  Medications prescribed through [facility] include Percocet 5/325 half a tablet at 9:00 a.m., 1:00 p.m., 6:00 p.m. and a full tablet at 10:00 p.m. at bedtime.  She also uses gabapentin 4 times a day.  On exam, her blood pressure is 178 /93, pulse 94, respirations 18, 94% saturation on room air.  She is obese [place] woman who appears her stated age and does not appear in any distress.  She is oriented x3. Her speech is clear.  Her affect is bright.  She is alert, cooperative, and pleasant.  Follows commands without difficulty answers my questions appropriately.  She does have some trouble remembering specifics of her day-to-day activities.  Cranial nerves and coordination are intact.  Her reflexes are diminished in upper and lower extremities.  No abnormal tone, clonus, or tremors are noted.  Motor strength is quite good both upper as well as lower extremity.  She has relatively well-preserved range of motion in her right shoulder, but she does complain of pain  with abduction today.  She is able to transition from sitting to standing.  Gait is slow, careful. She does use a walker.  She has some joint line tenderness along both knees.  No effusion is appreciated.  No mediolateral or AP instability is noted in either knee.  IMPRESSION: 1. Lumbago with known lumbar spondylosis. 2. Bilateral knee pain, intermittent. 3. Mild balance disorder. 4. New right shoulder pain worse with abduction, consistent with mild     impingement syndrome.  PLAN:  We will have her start icing her shoulder regularly throughout the day.  I have given her some exercises which she can work on to maintain range of motion.  I have asked her to take her 6 o'clock Percocet as prescribed.  I would like her to take it before she eats.  I have encouraged her to walk each day least 10-15 minutes two to three times a day.  Her pill counts are appropriate.  She and her husband are comfortable with our treatment plan. I have answered all her questions.  I have mentioned if the shoulder is not improving, we can consider injection at next visit.     Brantley Stage, M.D. Electronically Signed    DMK/MedQ D:  11/24/2010 13:41:45  T:  11/24/2010 20:05:07  Job #:  [id number]

## 2010-12-11 ENCOUNTER — Emergency Department (HOSPITAL_COMMUNITY): Payer: Medicare Other

## 2010-12-11 ENCOUNTER — Observation Stay (HOSPITAL_COMMUNITY)
Admission: EM | Admit: 2010-12-11 | Discharge: 2010-12-12 | Disposition: A | Discharge status: Home / Self Care | Payer: Medicare Other | Attending: Emergency Medicine | Admitting: Emergency Medicine

## 2010-12-11 DIAGNOSIS — Z79899 Other long term (current) drug therapy: Secondary | ICD-10-CM | POA: Insufficient documentation

## 2010-12-11 DIAGNOSIS — G459 Transient cerebral ischemic attack, unspecified: Principal | ICD-10-CM | POA: Insufficient documentation

## 2010-12-11 DIAGNOSIS — F0390 Unspecified dementia without behavioral disturbance: Secondary | ICD-10-CM | POA: Insufficient documentation

## 2010-12-11 DIAGNOSIS — I079 Rheumatic tricuspid valve disease, unspecified: Secondary | ICD-10-CM | POA: Insufficient documentation

## 2010-12-11 DIAGNOSIS — E785 Hyperlipidemia, unspecified: Secondary | ICD-10-CM | POA: Insufficient documentation

## 2010-12-11 DIAGNOSIS — I10 Essential (primary) hypertension: Secondary | ICD-10-CM | POA: Insufficient documentation

## 2010-12-11 DIAGNOSIS — Z7902 Long term (current) use of antithrombotics/antiplatelets: Secondary | ICD-10-CM | POA: Insufficient documentation

## 2010-12-11 DIAGNOSIS — E119 Type 2 diabetes mellitus without complications: Secondary | ICD-10-CM | POA: Insufficient documentation

## 2010-12-12 ENCOUNTER — Observation Stay (HOSPITAL_COMMUNITY): Payer: Medicare Other

## 2010-12-12 DIAGNOSIS — R0989 Other specified symptoms and signs involving the circulatory and respiratory systems: Secondary | ICD-10-CM

## 2010-12-12 DIAGNOSIS — G459 Transient cerebral ischemic attack, unspecified: Secondary | ICD-10-CM

## 2010-12-12 LAB — DIFFERENTIAL
Basophils Absolute: 0 10*3/uL (ref 0.0–0.1)
Eosinophils Absolute: 0.2 10*3/uL (ref 0.0–0.7)
Eosinophils Relative: 3 % (ref 0–5)
Lymphocytes Relative: 22 % (ref 12–46)
Monocytes Absolute: 0.5 10*3/uL (ref 0.1–1.0)

## 2010-12-12 LAB — APTT: aPTT: 33 seconds (ref 24–37)

## 2010-12-12 LAB — LIPID PANEL
Cholesterol: 176 mg/dL (ref 0–200)
HDL: 71 mg/dL (ref >39)
Total CHOL/HDL Ratio: 2.5 RATIO
VLDL: 21 mg/dL (ref 0–40)

## 2010-12-12 LAB — COMPREHENSIVE METABOLIC PANEL
Albumin: 3.5 g/dL (ref 3.5–5.2)
Alkaline Phosphatase: 139 U/L — ABNORMAL HIGH (ref 39–117)
BUN: 17 mg/dL (ref 6–23)
CO2: 30 mEq/L (ref 19–32)
Chloride: 102 mEq/L (ref 96–112)
Creatinine, Ser: 1.16 mg/dL — ABNORMAL HIGH (ref 0.50–1.10)
GFR calc non Af Amer: 45 mL/min — ABNORMAL LOW (ref >60)
Glucose, Bld: 230 mg/dL — ABNORMAL HIGH (ref 70–99)
Potassium: 3.8 mEq/L (ref 3.5–5.1)
Total Bilirubin: 0.3 mg/dL (ref 0.3–1.2)

## 2010-12-12 LAB — GLUCOSE, CAPILLARY: Glucose-Capillary: 182 mg/dL — ABNORMAL HIGH (ref 70–99)

## 2010-12-12 LAB — CBC
HCT: 40.8 % (ref 36.0–46.0)
MCHC: 34.1 g/dL (ref 30.0–36.0)
MCV: 97.4 fL (ref 78.0–100.0)
RDW: 12.8 % (ref 11.5–15.5)

## 2010-12-12 LAB — CK TOTAL AND CKMB (NOT AT ARMC)
Relative Index: 3.6 — ABNORMAL HIGH (ref 0.0–2.5)
Total CK: 107 U/L (ref 7–177)

## 2010-12-12 LAB — PROTIME-INR: INR: 1.08 (ref 0.00–1.49)

## 2010-12-12 LAB — POCT I-STAT, CHEM 8
Calcium, Ion: 1.2 mmol/L (ref 1.12–1.32)
Creatinine, Ser: 1.2 mg/dL — ABNORMAL HIGH (ref 0.50–1.10)
Glucose, Bld: 228 mg/dL — ABNORMAL HIGH (ref 70–99)
Hemoglobin: 15 g/dL (ref 12.0–15.0)
Potassium: 3.9 mEq/L (ref 3.5–5.1)

## 2010-12-12 LAB — TROPONIN I: Troponin I: <0.3 ng/mL (ref <0.30)

## 2010-12-12 LAB — HEMOGLOBIN A1C: Hgb A1c MFr Bld: 9.1 % — ABNORMAL HIGH (ref <5.7)

## 2010-12-15 ENCOUNTER — Telehealth: Payer: Self-pay | Admitting: *Deleted

## 2010-12-15 NOTE — Telephone Encounter (Signed)
Please finish

## 2010-12-15 NOTE — Telephone Encounter (Signed)
Left message for pt's daughter to callback office.  

## 2010-12-15 NOTE — Telephone Encounter (Signed)
Pt's granddaughter informed of  MD's advisement.

## 2010-12-15 NOTE — Telephone Encounter (Signed)
Pt was seen in ER this week and was diagnosed with Bell's palsy. She was rx'd Prednisone and Zovirax for 7 days. Pt was suppose to take Zovirax five times daily x 7 days, however daughter only gave her medication four times daily because she did not want to give five times daily with all the medication she is taking. She wants to know if she should give her the rest of the Zovirax since she has pills left-please advise.

## 2010-12-18 ENCOUNTER — Encounter: Payer: Self-pay | Admitting: Endocrinology

## 2010-12-18 ENCOUNTER — Ambulatory Visit (INDEPENDENT_AMBULATORY_CARE_PROVIDER_SITE_OTHER): Payer: Medicare Other | Admitting: Endocrinology

## 2010-12-18 DIAGNOSIS — G510 Bell's palsy: Secondary | ICD-10-CM

## 2010-12-18 NOTE — Progress Notes (Signed)
Subjective:    Patient ID: Shannon Charles, female    DOB: 1927/11/27, 77 y.o.   MRN: [id number]  HPI Pt was seen in er 1 week ago for bell's palsy.  Since on the prednisone, facial weakness is 50% better. Since on the prednisone, cbg's have been 98-300.  no cbg record, but states cbg's were in the same range prior to the prednisone.  Husband says they can no longer afford the lantus.  Past Medical History  Diagnosis Date  . DEMENTIA 08/23/2006  . DEPRESSION 08/23/2006  . DIABETES MELLITUS, TYPE I 08/23/2006  . HYPERLIPIDEMIA 08/23/2006  . HYPERTENSION 08/23/2006  . HYPOTHYROIDISM 08/23/2006  . OSTEOARTHRITIS 08/23/2006  . RENAL INSUFFICIENCY 08/23/2006  . Personal history of colonic polyps 04/29/2007  . Lung disease, restrictive     Mild, by pulmonary function test in October 2006 with mildly reduce DLCO    No past surgical history on file.  History   Social History  . Marital Status: Married    Spouse Name: N/A    Number of Children: N/A  . Years of Education: N/A   Occupational History  . Not on file.   Social History Main Topics  . Smoking status: Never Smoker   . Smokeless tobacco: Not on file  . Alcohol Use: Not on file  . Drug Use: Not on file  . Sexually Active: Not on file   Other Topics Concern  . 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 amitriptyline (ELAVIL) 10 MG tablet 1/2 tablet at bedtime       . buPROPion (WELLBUTRIN XL) 150 MG 24 hr tablet 2 tablets by mouth daily      . donepezil (ARICEPT) 10 MG tablet Take 10 mg by mouth daily.        . dorzolamide-timolol (COSOPT) 22.3-6.8 MG/ML ophthalmic solution Place 1 drop into the left eye daily.        . furosemide (LASIX) 40 MG tablet Take 1 tablet (40 mg total) by mouth daily.  90 tablet  1  . gabapentin (NEURONTIN) 300 MG capsule Take 300 mg by mouth daily.       Marland Kitchen  levothyroxine (SYNTHROID, LEVOTHROID) 200 MCG tablet Take 1 tablet (200 mcg total) by mouth daily.  90 tablet  3  . lisdexamfetamine (VYVANSE) 30 MG capsule Take 30 mg by mouth every morning.        Marland Kitchen losartan (COZAAR) 100 MG tablet Take 1 tablet (100 mg total) by mouth daily.  90 tablet  3  . memantine (NAMENDA) 10 MG tablet Take 1 tablet (10 mg total) by mouth 2 (two) times daily.  180 tablet  1  . oxyCODONE-acetaminophen (PERCOCET) 5-325 MG per tablet Take 1 tablet by mouth 4 (four) times daily. Dr Pamelia Hoit      . simvastatin (ZOCOR) 40 MG tablet TAKE 1 TABLET AT BEDTIME (DUE FOR PHYSICAL EXAM WITH MD)  90 tablet  2    No Known Allergies  Family History  Problem Relation Age of Onset  . Cancer Sister     uncertain type    BP 136/96  Pulse 100  Temp(Src) 98.9 F (37.2 C) (Oral)  Ht 5\' 2"  (1.575 m)  Wt 194 lb 6.4 oz (88.179 kg)  BMI 35.56 kg/m2  SpO2 94%    Review of Systems denies hypoglycemia.  She  has weight gain.      Objective:   Physical Exam VITAL SIGNS:  See vs page GENERAL: no distress.  In wheelchair.  Obese Neuro: has left peripheral VII palsy.     Assessment & Plan:  Bell's palsy, improved Htn, with ? Of situational component Dm, therapy limited by pt's need for a simple regimen

## 2010-12-18 NOTE — Patient Instructions (Addendum)
check your blood sugar  times a day.  vary the time of day when you check, between before the 3 meals, and at bedtime.  also check if you have symptoms of your blood sugar being too high or too low.  please keep a record of the readings and bring it to your next appointment here.  please call us sooner if you are having low blood sugar episodes.  If it is below 100, please write on the paper why you think this has happened.   Please continue the same blood pressure medication for now.  We'll recheck when you return. Please make a follow-up appointment in 3 months.  Stop prednisone.  You should tape your left eye closed until the bells palsy is better.  Please weigh the cheaper "[employer]" insulin against the ease of the lantus pen.  Please let me know when you come back in 1 month.   We'll recheck the blood pressure when you return

## 2010-12-21 DIAGNOSIS — G510 Bell's palsy: Secondary | ICD-10-CM | POA: Insufficient documentation

## 2011-01-01 ENCOUNTER — Encounter
Payer: Medicare Other | Attending: Physical Medicine and Rehabilitation | Admitting: Physical Medicine and Rehabilitation

## 2011-01-06 ENCOUNTER — Other Ambulatory Visit: Payer: Self-pay | Admitting: Internal Medicine

## 2011-01-08 ENCOUNTER — Encounter
Payer: Medicare Other | Attending: Physical Medicine and Rehabilitation | Admitting: Physical Medicine and Rehabilitation

## 2011-01-08 ENCOUNTER — Other Ambulatory Visit: Payer: Self-pay | Admitting: Endocrinology

## 2011-01-08 DIAGNOSIS — R279 Unspecified lack of coordination: Secondary | ICD-10-CM | POA: Insufficient documentation

## 2011-01-08 DIAGNOSIS — M47817 Spondylosis without myelopathy or radiculopathy, lumbosacral region: Secondary | ICD-10-CM | POA: Insufficient documentation

## 2011-01-08 DIAGNOSIS — M25569 Pain in unspecified knee: Secondary | ICD-10-CM | POA: Insufficient documentation

## 2011-01-08 DIAGNOSIS — M545 Low back pain, unspecified: Secondary | ICD-10-CM | POA: Insufficient documentation

## 2011-01-08 DIAGNOSIS — M25519 Pain in unspecified shoulder: Secondary | ICD-10-CM | POA: Insufficient documentation

## 2011-01-08 DIAGNOSIS — M75100 Unspecified rotator cuff tear or rupture of unspecified shoulder, not specified as traumatic: Secondary | ICD-10-CM

## 2011-01-08 MED ORDER — INSULIN DETEMIR 100 UNIT/ML ~~LOC~~ SOLN: 65.0000 [IU] | SUBCUTANEOUS | Status: DC

## 2011-01-09 NOTE — Assessment & Plan Note (Signed)
[name] is a pleasant 77 year old African American woman who is accompanied by her grandson today.  He is in the room with her permission.  She has returned to our [facility] for brief recheck and refill of her pain medications.  Her average pain is about 8 on a scale of 10.  Her chief complaint today is right shoulder pain.  She has had this right shoulder pain at least since August.  She is having trouble getting her shirt on and off now and tells me she has lost some mobility in her right shoulder.  In the interim, she has developed a Bell palsy of her right facial muscles.  She had an MRI of her brain on December 12, 2010.  She continues to maintain contact with Dr. Romero Charles who is her primary care physician.  She has been using Percocet half a tablet at 9 a.m., 1 p.m., 6 p.m., and a full tablet at bedtime.  Her pill counts indicate that she has really used only six pills since November 24, 2010.  She brings in a 39 pills today from a prescription of 45 pills.  Her last urine drug screen was consistent and that was on October 25, 2010.  FUNCTIONAL STATUS:  She walks with a cane or a walker, cane for home and walker for longer distances.  She is independent with self-care.  She admits to confusion, depression, and anxiety.  She indicates she has had a weight gain as well.  SOCIAL AND FAMILY HISTORY:  Unchanged.  PHYSICAL EXAMINATION:  Today, her blood pressure is 171/81, pulse 117, respiration 16, 91% saturated on room air.  She is a well-developed, obese, African American woman who does not appear in any distress.  She is oriented x3.  Her speech is clear.  Her affect is somewhat anxious, but she is alert, cooperative, and pleasant.  She follows commands without difficulty, answers my questions to the best of her knowledge. Her reflexes are 1+ in the upper extremities, 1+ in the lower extremities.  No abnormal tone, clonus, or  tremors are noted.  Her motor strength is in good in the left upper and bilateral lower extremities. She has trouble giving full effort especially in the proximal arm musculature due to pain in her right shoulder.  She has limited abduction today.  She has difficulty lifting her arm much more than 45 degrees without pain.  She has significantly limited internal as well as external rotation of the right shoulder.  She has relatively good range of motion in her neck without pain.  She has no sensory deficits with pinprick in the right upper extremity or light touch.  She has tenderness in the area of the bicipital tendon and just distal to the right acromion.  IMPRESSION: 1. Worsening of right shoulder pain with limited abduction, internal     and external rotation. 2. Lumbago with known lumbar spondylosis. 3. Bilateral knee pain, intermittent. 4. Mild balance disorder.  PLAN:  I reviewed treatment options with her regarding her shoulder, continuing icing and physical therapy; however, some concern here about loss of further range of motion from August.  We did discuss possibility of considering shoulder injection, she would like to proceed with this. I have discussed the risks and benefits of this procedure as well as alternatives.  A phone call to Dr. Everardo Charles to discuss diabetic status and he indicates he would like to hear from her if her blood sugars are above  200.  I have discussed this with the patient as well as her grandson.  She indicates understanding as well.  The shoulder was prepped with alcohol and Betadine and marked, 1 mL of Kenalog and 6 mL of 1% lidocaine were injected using a posterior lateral approach.  She tolerated the procedure well and noted some improvement in her shoulder pain and was able to abduct the shoulder to approximately 90 degrees after injection.  I have given her some post-injection instructions.  I will see her back in 1 month.  She did not need a  refill on any of her pain medication today.  I have answered Charles of their questions.  They are comfortable with our treatment plan.     Brantley Stage, M.D. Electronically Signed    DMK/MedQ D:  01/08/2011 14:24:14  T:  01/09/2011 04:32:07  Job #:  [id number]

## 2011-01-15 ENCOUNTER — Encounter: Payer: Self-pay | Admitting: Endocrinology

## 2011-01-15 ENCOUNTER — Ambulatory Visit (INDEPENDENT_AMBULATORY_CARE_PROVIDER_SITE_OTHER)
Admission: RE | Admit: 2011-01-15 | Discharge: 2011-01-15 | Disposition: A | Discharge status: Home / Self Care | Payer: Medicare Other | Source: Ambulatory Visit | Attending: Endocrinology | Admitting: Endocrinology

## 2011-01-15 ENCOUNTER — Ambulatory Visit (INDEPENDENT_AMBULATORY_CARE_PROVIDER_SITE_OTHER): Payer: Medicare Other | Admitting: Endocrinology

## 2011-01-15 VITALS — BP 126/80 | HR 97 | Temp 97.9°F | Ht 62.0 in | Wt 194.0 lb

## 2011-01-15 DIAGNOSIS — R05 Cough: Secondary | ICD-10-CM

## 2011-01-15 DIAGNOSIS — R059 Cough, unspecified: Secondary | ICD-10-CM

## 2011-01-15 DIAGNOSIS — L97409 Non-pressure chronic ulcer of unspecified heel and midfoot with unspecified severity: Secondary | ICD-10-CM

## 2011-01-15 DIAGNOSIS — L97529 Non-pressure chronic ulcer of other part of left foot with unspecified severity: Secondary | ICD-10-CM

## 2011-01-15 NOTE — Patient Instructions (Addendum)
Refer to a wound-care specialist.  you will receive a phone call, about a day and time for an appointment. For now, you should keep it covered with antibiotic ointment and a large bandaid.check your blood sugar  times a day.  vary the time of day when you check, between before the 3 meals, and at bedtime.  also check if you have symptoms of your blood sugar being too high or too low.  please keep a record of the readings and bring it to your next appointment here.  please call us sooner if you are having low blood sugar episodes.  If it is below 100, please write on the paper why you think this has happened.   Please continue the same blood pressure medication for now.  We'll recheck when you return. Please make a follow-up appointment in 3 months.  Stop prednisone.  You should tape your left eye closed until the bells palsy is better.   It will get better, but it may take months, for same people.   Please continue the same blood-pressure medication. Let's recheck the chest-x-ray today.  please call [phone] to hear your test results.  You will be prompted to enter the 9-digit "MRN" number that appears at the top left of this page, followed by #.  Then you will hear the message. (update: i left message on phone-tree:  rx as we discussed)

## 2011-01-15 NOTE — Progress Notes (Signed)
Subjective:    Patient ID: Shannon Charles, female    DOB: 1928/03/16, 77 y.o.   MRN: [id number]  Cough  pt has a few weeks of a moderate ulcer at the left heel, but no assoc pain. Bells palsy is only slightly better Past Medical History  Diagnosis Date  . DEMENTIA 08/23/2006  . DEPRESSION 08/23/2006  . DIABETES MELLITUS, TYPE I 08/23/2006  . HYPERLIPIDEMIA 08/23/2006  . HYPERTENSION 08/23/2006  . HYPOTHYROIDISM 08/23/2006  . OSTEOARTHRITIS 08/23/2006  . RENAL INSUFFICIENCY 08/23/2006  . Personal history of colonic polyps 04/29/2007  . Lung disease, restrictive     Mild, by pulmonary function test in October 2006 with mildly reduce DLCO    No past surgical history on file.  History   Social History  . Marital Status: Married    Spouse Name: N/A    Number of Children: N/A  . Years of Education: N/A   Occupational History  . Not on file.   Social History Main Topics  . Smoking status: Never Smoker   . Smokeless tobacco: Not on file  . Alcohol Use: Not on file  . Drug Use: Not on file  . Sexually Active: Not on file   Other Topics Concern  . 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 acyclovir (ZOVIRAX) 400 MG tablet Take 400 mg by mouth 4 (four) times daily.        Marland Kitchen amitriptyline (ELAVIL) 10 MG tablet 1/2 tablet at bedtime       . buPROPion (WELLBUTRIN XL) 150 MG 24 hr tablet 2 tablets by mouth daily      . donepezil (ARICEPT) 10 MG tablet Take 10 mg by mouth daily.        . dorzolamide-timolol (COSOPT) 22.3-6.8 MG/ML ophthalmic solution Place 1 drop into the left eye daily.        . furosemide (LASIX) 40 MG tablet Take 1 tablet (40 mg total) by mouth daily.  90 tablet  1  . gabapentin (NEURONTIN) 300 MG capsule Take 300 mg by mouth daily.       . insulin detemir (LEVEMIR FLEXPEN) 100 UNIT/ML injection Inject 65 Units into the skin  every morning.  30 mL  12  . levothyroxine (SYNTHROID, LEVOTHROID) 200 MCG tablet Take 1 tablet (200 mcg total) by mouth daily.  90 tablet  3  . lisdexamfetamine (VYVANSE) 30 MG capsule Take 30 mg by mouth every morning.        Marland Kitchen losartan (COZAAR) 100 MG tablet Take 1 tablet (100 mg total) by mouth daily.  90 tablet  3  . memantine (NAMENDA) 10 MG tablet Take 1 tablet (10 mg total) by mouth 2 (two) times daily.  180 tablet  1  . oxyCODONE-acetaminophen (PERCOCET) 5-325 MG per tablet Take 1 tablet by mouth 4 (four) times daily. Dr Pamelia Hoit      . simvastatin (ZOCOR) 40 MG tablet TAKE 1 TABLET AT BEDTIME (DUE FOR PHYSICAL EXAM WITH MD)  90 tablet  2  . benzonatate (TESSALON) 100 MG capsule 1 BY MOUTH THREE TIMES A DAY AS NEEDED FOR COUGH  90 capsule  1    No Known Allergies  Family History  Problem Relation Age of Onset  . Cancer Sister     uncertain type    BP 126/80  Pulse 97  Temp(Src) 97.9  F (36.6 C) (Oral)  Ht 5\' 2"  (1.575 m)  Wt 194 lb (87.998 kg)  BMI 35.48 kg/m2  SpO2 97%    Review of Systems  Respiratory: Positive for cough.    Denies sob    Objective:   Physical Exam VITAL SIGNS:  See vs page GENERAL: no distress LUNGS:  Clear to auscultation Pulses: dorsalis pedis intact bilat.   Feet: no deformity.  There is a 4 cm shallow ulcer at the left heel.  feet are of normal color and temp.  no edema.  There is bilateral onychomycosis Neuro: sensation is intact to touch on the feet.   The left VII palsy persists      Assessment & Plan:  Foot ulcer, new Cough, uncertain etiology.  W/U not indicated in view of poor overall state of her health Bells palsy, slow to improve Htn, well-controlled

## 2011-01-18 ENCOUNTER — Ambulatory Visit: Payer: Medicare Other | Admitting: Endocrinology

## 2011-01-22 ENCOUNTER — Encounter (HOSPITAL_BASED_OUTPATIENT_CLINIC_OR_DEPARTMENT_OTHER): Payer: Medicare Other | Attending: Internal Medicine

## 2011-01-22 DIAGNOSIS — Z79899 Other long term (current) drug therapy: Secondary | ICD-10-CM | POA: Insufficient documentation

## 2011-01-22 DIAGNOSIS — L8990 Pressure ulcer of unspecified site, unspecified stage: Secondary | ICD-10-CM | POA: Insufficient documentation

## 2011-01-22 DIAGNOSIS — Z794 Long term (current) use of insulin: Secondary | ICD-10-CM | POA: Insufficient documentation

## 2011-01-22 DIAGNOSIS — L89609 Pressure ulcer of unspecified heel, unspecified stage: Secondary | ICD-10-CM | POA: Insufficient documentation

## 2011-01-22 DIAGNOSIS — E109 Type 1 diabetes mellitus without complications: Secondary | ICD-10-CM | POA: Insufficient documentation

## 2011-01-22 DIAGNOSIS — G510 Bell's palsy: Secondary | ICD-10-CM | POA: Insufficient documentation

## 2011-01-23 NOTE — Progress Notes (Unsigned)
[facility]  NAME:  Shannon Charles, Shannon Charles                ACCOUNT NO.:  [id number]  MEDICAL RECORD NO.:  [id number]      DATE OF BIRTH:  22-Feb-1928  PHYSICIAN:  Jonelle Sports. Sevier, M.D.  VISIT DATE:  01/22/2011                                  OFFICE VISIT   HISTORY:  This 77 year old black female, is referred for evaluation and treatment of a blackened area on the left posterior heel.  The patient has a fairly complex medical history built around what is said to be type 1 diabetes which is indeed being treated with a single dose of insulin per day and has been present most of her adult life. She is unaware of specific complications of this disease and we have no notes regarding that in her referral materials.  She is unaware that it has ever caused her troubles with her eyes, kidneys, or heart.  She reports that at some point in the fairly recent past, she apparently had some type of lesion on her right heel, but that it "fell off over time."  Now for approximately 3 weeks, she has noted a black eschar on the posterior aspect of her left heel and it has failed to progress with some limited topical treatment and so she is referred here now for our evaluation and advice.  PAST MEDICAL HISTORY:  Notable for hysterectomy in the distant past, colonic polypectomy within the past 10 years.  She has no definite medical hospitalizations that we can establish certainty.  ALLERGIES:  She has no known medicinal allergies.  REGULAR MEDICATIONS: 1. Acyclovir 400 mg q.i.d., apparently being given for recurrent Bell     palsy. 2. Elavil 10 mg daily. 3. Wellbutrin 100 mg twice daily. 4. Aricept 10 mg daily. 5. Lasix 40 mg daily. 6. Neurontin 300 mg daily. 7. Levemir insulin 65 units daily in the morning. 8. Cozaar 100 mg daily. 9. Percocet 5/325 four times daily.  FAMILY HISTORY:  Not available in detail, apparently is positive for hypertension and type 2 diabetes.  SOCIAL  HISTORY:  The patient is a high Garment/textile technologist.  She is married and currently is in a nursing care facility because of her dementia and other ongoing problems.  She says she has never smoked, does not use alcohol or recreational drugs.  She requires assistance now with most of the activities of daily living.  She is able to walk without assistance.  REVIEW OF SYSTEMS:  The patient has a history of mild restrictive lung disease that was demonstrated on pulmonary function tests only several years ago and has not required treatment and does not appear to have advanced.  She has no known coronary artery disease, has never had a cerebrovascular accident, currently has Bell palsy which has been present for several weeks, this is on the right side.  She is hypertensive that is said to be in pretty good control.  She is diabetic, again apparently her sugars were checked fairly infrequently on a b.i.d. basis and often run high in the mornings.  She has no history of cancer, did have some subclinical GI blood loss, it was detected and found to be associated with colonic polyps which were dealt with endoscopically.  She has no history of sickle cell disease, anemia,  or other hematologic problems.  She has had some tendency to swelling and for this, is on Lasix which is also in part to benefit her hypertension.  PHYSICAL EXAMINATION:  VITAL SIGNS:  Blood pressure is 174/94, pulse is 106 and regular, respirations 20, temperature 97.6. GENERAL:  This is a remarkably youthful appearing 77 year old black female with apparent Bell palsy on her right face who is slightly demented and semi-agitated.  She is, despite this, reasonably cooperative with her examiner.  She does not appear to be in any pain and denies pain. SKIN:  Warm and dry. HEENT: Her mucous membranes are moist and reflect adequate perfusion and probably reasonably normal hemoglobin. NECK:  Supple.  She has no venous engorgement.  Her  carotid pulses are palpable. CHEST:  Grossly clear to auscultation. HEART:  Her heart tones are of fair quality with no certain no murmur or gallop. ABDOMEN:  Obese but without organomegaly, masses, or tenderness. EXTREMITIES:  At this time, are free of edema.  All pulses are palpable. She has good capillary filling in the toes.  She has hypertrophic and mycotic toenails, that is worse on the left foot than on the right.  On the posterior aspect of the left heel, there is a large eschar, black in color, measuring 3.4 x 4.4 cm.  There is no fluctuance beneath this and no spreading erythema from it and no apparent drainage or odor.  IMPRESSION:  Pressure ulcer, left heel, likely due to friction from bed linens related to motion secondary to her dementia and agitation.  DISPOSITION:  The decision was made today not to attempt to debride this eschar, would rather do place her in a pressure relief boots which is to be worn 24/7 to try to let this wound go ahead and heal and extrude the scar of the eschar on its own.  As time goes on and the ages loosen, we may assist this.  With her morning sugars running high, we have suggested to the family that perhaps they might consult with Dr.  Everardo All regarding perhaps a splitting of her Levemir dose.  They were advised to keep the wound clean and dry.  We have dressed it simply with a dry gauze and an overlying Allevyn pad before placing that foot in heel pressure relief boot.  They are cautioned that this will make her unsteady on her feet and she needs to either walk with assistance or use a cane or walker to prevent imbalance and falling.  Referral was made to Dr. Larey Dresser, podiatrist, for assistance with management of her hypertrophic and mycotic toenails.  Followup visit will be here in 2 weeks and that no significant changes anticipated prior to that time.  The family is alerted to symptoms that would cause [place] to want to see her  sooner namely increasing pain, drainage, odor, fever, spreading erythema, etc.          ______________________________ Jonelle Sports. Cheryll Cockayne, M.D.     RES/MEDQ  D:  01/22/2011  T:  01/23/2011  Job:  [id number]

## 2011-01-29 ENCOUNTER — Encounter: Payer: Medicare Other | Attending: Neurosurgery | Admitting: Neurosurgery

## 2011-01-29 DIAGNOSIS — M545 Low back pain, unspecified: Secondary | ICD-10-CM

## 2011-01-29 DIAGNOSIS — E669 Obesity, unspecified: Secondary | ICD-10-CM | POA: Insufficient documentation

## 2011-01-29 DIAGNOSIS — M67919 Unspecified disorder of synovium and tendon, unspecified shoulder: Secondary | ICD-10-CM

## 2011-01-29 DIAGNOSIS — M25519 Pain in unspecified shoulder: Secondary | ICD-10-CM | POA: Insufficient documentation

## 2011-01-29 DIAGNOSIS — M719 Bursopathy, unspecified: Secondary | ICD-10-CM

## 2011-01-29 DIAGNOSIS — M25569 Pain in unspecified knee: Secondary | ICD-10-CM | POA: Insufficient documentation

## 2011-01-29 DIAGNOSIS — F0390 Unspecified dementia without behavioral disturbance: Secondary | ICD-10-CM | POA: Insufficient documentation

## 2011-01-29 DIAGNOSIS — M1710 Unilateral primary osteoarthritis, unspecified knee: Secondary | ICD-10-CM

## 2011-01-29 DIAGNOSIS — L988 Other specified disorders of the skin and subcutaneous tissue: Secondary | ICD-10-CM | POA: Insufficient documentation

## 2011-01-29 NOTE — Assessment & Plan Note (Signed)
This is a patient of Dr. Leretha Dykes that was on her schedule today and I saw her because Dr. Pamelia Hoit had to leave the office urgently.  The patient is doing well.  She is seen with her grandson.  She does have a CAM walker type boot on her left lower extremity due to a eschar type wound that she has been seen in the wound center for.  The grandson says that the doctor at the wound center thinks it is coming along fine and should heal up without any problems.  He changes the dressings at home. She rates her average pain as 7 to sharp tingling pain.  Activity level 0-3. Pain is same 24 hours a day.  Sleep patterns are fair.  Pain is worse with walking and activity.  Heat and medication tend to help.  She does use a walker and her grandson is asking for a prescription for a rolling seated walker which I am going to leave the chart for Dr. Leretha Dykes review and hopefully we will get that ordered if she is in agreement with that.  She is on disability.  She needs help with household duties and meal prep.  REVIEW OF SYSTEMS:  Notable for difficulties described above, otherwise within normal limits.  Last cocaine and UDS correct.  PAST MEDICAL HISTORY/SOCIAL HISTORY/FAMILY HISTORY:  Unchanged.  PHYSICAL EXAMINATION:  Her blood pressure is 164/94, pulse 93, respirations 18, O2 sats 93 on room air.  Motor strength is not tested in lower extremities due to the problems she is having.  Her sensation is intact.  Constitutionally, she is mildly obese.  She is alert and oriented x3 even though she does have progressive dementia.  ASSESSMENT: 1. Bilateral shoulder pain right worse than the left with any type of     internal, external rotation type maintenance. 2. Lumbago. 3. Bilateral knee pain. 4. Left lower extremity eschar wound treated by the wound center.  PLAN:  Refill of Percocet 5/325, 1/2 tab q.a.m. 1:00 p.m. and whole tab at hour of sleep.  Her grandson stated understanding.  I  am going to leave the chart again for Dr. Pamelia Hoit to look at regarding the rolling seated walker.  Their questions were encouraged and answered. We will see them back in a month.     Bronte Kropf L. Blima Dessert Electronically Signed    RLW/MedQ D:  01/29/2011 12:28:44  T:  01/29/2011 18:14:43  Job #:  [id number]

## 2011-02-05 ENCOUNTER — Ambulatory Visit: Payer: Medicare Other | Admitting: Endocrinology

## 2011-02-05 ENCOUNTER — Encounter (HOSPITAL_BASED_OUTPATIENT_CLINIC_OR_DEPARTMENT_OTHER): Payer: Medicare Other | Attending: Internal Medicine

## 2011-02-05 DIAGNOSIS — E109 Type 1 diabetes mellitus without complications: Secondary | ICD-10-CM | POA: Insufficient documentation

## 2011-02-05 DIAGNOSIS — Z79899 Other long term (current) drug therapy: Secondary | ICD-10-CM | POA: Insufficient documentation

## 2011-02-05 DIAGNOSIS — G510 Bell's palsy: Secondary | ICD-10-CM | POA: Insufficient documentation

## 2011-02-05 DIAGNOSIS — L8990 Pressure ulcer of unspecified site, unspecified stage: Secondary | ICD-10-CM | POA: Insufficient documentation

## 2011-02-05 DIAGNOSIS — L89609 Pressure ulcer of unspecified heel, unspecified stage: Secondary | ICD-10-CM | POA: Insufficient documentation

## 2011-02-05 DIAGNOSIS — Z794 Long term (current) use of insulin: Secondary | ICD-10-CM | POA: Insufficient documentation

## 2011-02-05 NOTE — H&P (Signed)
NAMEMarland Kitchen  KEILA, TURAN NO.:  [id number]  MEDICAL RECORD NO.:  [id number]  LOCATION:  FOOT                         FACILITY:  [facility]  PHYSICIAN:  Ardath Sax, M.D.     DATE OF BIRTH:  01-03-1928  DATE OF ADMISSION:  02/05/2011 DATE OF DISCHARGE:                             HISTORY & PHYSICAL   Shannon Charles is an 77 year old African American female who enters the wound clinic because of diabetic foot ulcer, which looks to me like just a pressure sore on her left heel.  She has a history of diabetes for many years and is presently taking oral agents for her diabetes.  She is also on acyclovir, amitriptyline, Wellbutrin, Lasix, gabapentin, Synthroid, Cozaar, Namenda, and she is on Percocet for pain.  She has a history of dementia, depression, diabetes, hyperlipidemia, hypertension, hypothyroidism, osteoarthritis and renal insufficiency.  When I looked her over, she is very weak and seems confused.  She was with her son and husband and I debrided the decubitus off of her heel and there was viable skin underneath.  We advised her to wear the protective boot to offload since she spends so much time in bed.  I also encouraged the family to get her up in a chair.  She can actually walk with a walker, today we just work on it.  So they are going to do that and she will return here in a week, and in the meantime we put on the Profore Lite and we also put on a heel protection.     Ardath Sax, M.D.     PP/MEDQ  D:  02/05/2011  T:  02/05/2011  Job:  [id number]

## 2011-02-07 ENCOUNTER — Encounter
Payer: Medicare Other | Attending: Physical Medicine and Rehabilitation | Admitting: Physical Medicine and Rehabilitation

## 2011-02-07 DIAGNOSIS — M7500 Adhesive capsulitis of unspecified shoulder: Secondary | ICD-10-CM

## 2011-02-07 DIAGNOSIS — M545 Low back pain, unspecified: Secondary | ICD-10-CM | POA: Insufficient documentation

## 2011-02-07 DIAGNOSIS — M47817 Spondylosis without myelopathy or radiculopathy, lumbosacral region: Secondary | ICD-10-CM | POA: Insufficient documentation

## 2011-02-07 DIAGNOSIS — R279 Unspecified lack of coordination: Secondary | ICD-10-CM | POA: Insufficient documentation

## 2011-02-07 DIAGNOSIS — M25519 Pain in unspecified shoulder: Secondary | ICD-10-CM | POA: Insufficient documentation

## 2011-02-07 DIAGNOSIS — M25569 Pain in unspecified knee: Secondary | ICD-10-CM | POA: Insufficient documentation

## 2011-02-07 DIAGNOSIS — G8929 Other chronic pain: Secondary | ICD-10-CM | POA: Insufficient documentation

## 2011-02-07 NOTE — Assessment & Plan Note (Signed)
[name] is a pleasant 77 year old African American woman who is accompanied by her grandson as well as her husband today.  They are in the room with her permission.  [facility] for some chronic right shoulder pain, low back pain, bilateral intermittent knee pain.  She also has mild balance disorder.  She is a patient of Dr. Everardo All and also has some problems with dementia and a recent Bell palsy.  She also apparently has developed a pressure sore in her left heel and has been seeing [facility] for this.  She is here for a refill of her pain medications.  Her biggest problem at this time is her right shoulder and bilateral knee pain.  Her average pain is about 8 on a scale of 10, limiting her activity somewhat.  She can walk about 30 minutes, does not climb stairs nor drive.  She is independent with feeding, dressing, bathing, and toileting.  She can make a light meal and help out with the dishes as well.  Denies problems controlling bowel or bladder.  Denies numbness, tingling, tremors.  Admits to depression, anxiety, confusion, and trouble walking.  No other changes in past medical, social, or family history, other than that already noted.  Her blood pressure is 153 today over 68, pulse 99, respirations 18, 97% saturated on room air.  She is a well-developed obese woman who does not appear in any distress.  She is oriented x3. Speech is clear.  Affect is bright.  She is alert, cooperative, and pleasant.  She follows commands without difficulty.  Answers my questions appropriately.  She has a right facial droop.  Her reflexes are diminished in both upper and lower extremities without abnormal tone.  Her motor strength is quite good in both upper and lower extremities without obvious focal deficit.  She transitions from sitting to standing slowly.  Her gait is slow.  She does use a walker for balance.   Tandem gait and Romberg test are not assessed today due to safety concerns.  Her right shoulder is evaluated.  She has overall improvement from last visit in October.  She is able to abduct her shoulder to about 100 degrees.  She has approximately 70 degrees of external rotation and about 30 degrees of internal rotation.  She is able to put her hand behind her back today.  IMPRESSION: 1. Overall improvement in her right shoulder function and pain.  She     has improvements in range of motion in all planes with the right     shoulder. 2. Lumbago with known lumbar spondylosis stable. 3. Bilateral knee pain stable. 4. Mild balance disorder, uses a walker.  PLAN:  We will continue to monitor her use of pain medications.  She is using a half of 5/325 Percocet at 9:00 a.m., 1:00 p.m. and 4:00 p.m. and uses a whole Percocet at nightly.  Her pill counts are appropriate.  Her last urine drug screen was consistent.  She continues to use gabapentin as well as Percocet to help with her pain.  She reports fair relief with the use of these medications.  Apparently, she is going to be having some home health physical therapy come and work with her over the upcoming weeks.  There is some concern about recent prescriptions.  We will have nursing staff verify recent prescriptions with the pharmacy. Her husband believes that she may have gotten a 59-month supply  of Percocet several months ago.  However looking at records, it appears it was the gabapentin that she got a 16-month supply of and we will call [facility] as well as her pharmacy to confirm that this is the case.  We will have nurse practitioner follow her next month.  I will see them back in 2 months.  We will continue to monitor her pain medication usage.     Brantley Stage, M.D. Electronically Signed    DMK/MedQ D:  02/07/2011 14:09:08  T:  02/07/2011 19:23:56  Job #:  [id number]

## 2011-02-13 ENCOUNTER — Telehealth: Payer: Self-pay | Admitting: *Deleted

## 2011-02-13 NOTE — Telephone Encounter (Signed)
[facility] called for orders for PT for pt. Wound center placed orders for skilled nursing but they will not approve orders for PT and wants PCP to approved. Pt is having trouble with gait and balance, and has weakness in shoulders and legs. Wants PT orders for 1 week 1 and 2 week 6-[facility] pt-please advise in [facility]E's absence

## 2011-02-14 NOTE — Telephone Encounter (Signed)
Verbal order - yes, thanks

## 2011-02-14 NOTE — Telephone Encounter (Signed)
[facility] informed.

## 2011-03-07 ENCOUNTER — Encounter: Payer: Medicare Other | Attending: Neurosurgery | Admitting: Neurosurgery

## 2011-03-07 DIAGNOSIS — M47817 Spondylosis without myelopathy or radiculopathy, lumbosacral region: Secondary | ICD-10-CM

## 2011-03-07 DIAGNOSIS — M25519 Pain in unspecified shoulder: Secondary | ICD-10-CM | POA: Insufficient documentation

## 2011-03-07 DIAGNOSIS — M545 Low back pain, unspecified: Secondary | ICD-10-CM | POA: Insufficient documentation

## 2011-03-07 DIAGNOSIS — M1710 Unilateral primary osteoarthritis, unspecified knee: Secondary | ICD-10-CM

## 2011-03-07 DIAGNOSIS — R279 Unspecified lack of coordination: Secondary | ICD-10-CM | POA: Insufficient documentation

## 2011-03-07 DIAGNOSIS — M25569 Pain in unspecified knee: Secondary | ICD-10-CM | POA: Insufficient documentation

## 2011-03-07 DIAGNOSIS — I69998 Other sequelae following unspecified cerebrovascular disease: Secondary | ICD-10-CM | POA: Insufficient documentation

## 2011-03-07 DIAGNOSIS — G8929 Other chronic pain: Secondary | ICD-10-CM | POA: Insufficient documentation

## 2011-03-07 DIAGNOSIS — M25529 Pain in unspecified elbow: Secondary | ICD-10-CM

## 2011-03-08 NOTE — Assessment & Plan Note (Signed)
This is a patient of Dr. Pamelia Hoit that is followed up here for history of chronic right shoulder pain, low back pain with some knee pain.  She also has mild balance disorder status post CVA.  She has done well.  She does have a rolling walker with seat now.  She is ambulating with that independently.  She rates her average pain at an 8.  It is a sharp, burning pain.  General activity level is 2.  Pain is worse during the day.  Sleep patterns are fair and activity aggravates.  Medicine tends to help.  No signs of aberrant behavior.  Last pill counts and UDS consistent.  Mobility, again she uses a rolling seated walker. Functionally, she is retired.  Does need some help with ADLs.  REVIEW OF SYSTEMS:  Notable for the difficulties described above as well as some confusion but very mild.  Her speech is clear today.  PAST MEDICAL HISTORY/SOCIAL HISTORY/FAMILY HISTORY:  Unchanged.  PHYSICAL EXAMINATION: Her blood pressure is 159/80, pulse 82, respirations 14, O2 sats 92 on room air.  Her lower extremity motor strength in the quadriceps and iliopsoas is about 4+ to 5/5 bilaterally.  She improved there. Sensation is intact.  Constitutionally, she is within normal limits. She is alert and oriented x3.  Her speech is fairly clear.  Gait is somewhat unstable but doing very good with a rolling walker.  IMPRESSION: 1. Right shoulder pain functionally improved. 2. Lumbago. 3. Bilateral knee pain. 4. Mild balance disorder, using rolling walker, doing well.  PLAN:  Refill Percocet 5/325 a half every morning, 1 o'clock and 6 o'clock in a whole and at bedtime, #75 with no refill.  Questions were encouraged and answered to her grandson and we will see them back in a month.     Marshal Schrecengost L. Blima Dessert Electronically Signed    RLW/MedQ D:  03/07/2011 13:16:34  T:  03/08/2011 04:41:19  Job #:  [id number]

## 2011-03-29 ENCOUNTER — Other Ambulatory Visit: Payer: Self-pay | Admitting: *Deleted

## 2011-03-29 MED ORDER — MEMANTINE HCL 10 MG PO TABS: 10.0000 mg | ORAL_TABLET | Freq: Two times a day (BID) | ORAL | Status: DC

## 2011-03-29 NOTE — Telephone Encounter (Signed)
Rx sent for 14 day supply of Namenda to [facility]-pt's spouse informed.

## 2011-03-29 NOTE — Telephone Encounter (Signed)
Pt's spouse called requesting refill of Namenda-wants 14 day supply sent to pharmacy and 90 day supply sent to [facility]-left message for pt to callback office with name of pharmacy to send temporary supply of medication to.

## 2011-04-06 ENCOUNTER — Ambulatory Visit: Payer: Medicare Other | Admitting: Endocrinology

## 2011-04-06 ENCOUNTER — Encounter
Payer: Medicare Other | Attending: Physical Medicine and Rehabilitation | Admitting: Physical Medicine and Rehabilitation

## 2011-04-06 DIAGNOSIS — R279 Unspecified lack of coordination: Secondary | ICD-10-CM | POA: Insufficient documentation

## 2011-04-06 DIAGNOSIS — M719 Bursopathy, unspecified: Secondary | ICD-10-CM | POA: Insufficient documentation

## 2011-04-06 DIAGNOSIS — M545 Low back pain, unspecified: Secondary | ICD-10-CM | POA: Insufficient documentation

## 2011-04-06 DIAGNOSIS — M7500 Adhesive capsulitis of unspecified shoulder: Secondary | ICD-10-CM | POA: Insufficient documentation

## 2011-04-06 DIAGNOSIS — M1710 Unilateral primary osteoarthritis, unspecified knee: Secondary | ICD-10-CM

## 2011-04-06 DIAGNOSIS — M25519 Pain in unspecified shoulder: Secondary | ICD-10-CM | POA: Insufficient documentation

## 2011-04-06 DIAGNOSIS — M67919 Unspecified disorder of synovium and tendon, unspecified shoulder: Secondary | ICD-10-CM | POA: Insufficient documentation

## 2011-04-06 DIAGNOSIS — G894 Chronic pain syndrome: Secondary | ICD-10-CM

## 2011-04-06 DIAGNOSIS — M25569 Pain in unspecified knee: Secondary | ICD-10-CM | POA: Insufficient documentation

## 2011-04-06 DIAGNOSIS — M549 Dorsalgia, unspecified: Secondary | ICD-10-CM | POA: Insufficient documentation

## 2011-04-08 NOTE — Assessment & Plan Note (Signed)
[name] is a very pleasant 78 year old woman who is accompanied by her grandson today.  She is back in today and her average pain score is about 6 on a scale of 10, which is improved from previous month whereas it was an 8 on a scale of 10.  She has occasional back achiness and her knees bother her when she transitions from sitting to standing and walks.  Her pain currently has been interfering very little with her activities.  Her sleep is fair. Pain is worse when she is up walking or does prolonged sitting.  She gets some fair relief with current meds.  FUNCTIONAL STATUS:  She can walk about 15 minutes.  She does not drive. She does not climb stairs.  She has been seen by home health physical therapy and has been working on some ambulation and some strengthening. She is independent with feeding, dressing, bathing, and toileting.  Denies problems with bowel or bladder.  Denies suicidal ideation.  Does admit to confusion, depression, and anxiety.  She follows up with Dr. Everardo All for her other medical problems.  REVIEW OF SYSTEMS:  Regarding review of systems, there are no other problems today.  PAST MEDICAL/SOCIAL/FAMILY HISTORY:  Unchanged from previous visit.  MEDICATIONS:  Prescribed through [facility] include gabapentin 300 mg 3 times a day and Percocet 1/2 a tablet in the morning, 1 p.m. and 6 p.m. and a full tablet at bedtime.  PHYSICAL EXAMINATION:  Her blood pressure is 171/75, pulse 98, respirations 18, and 93% saturated on room air.  She weighs 204 pounds. She is 62 inches tall, mildly obese woman.  She does not appear in any distress.  She is oriented x3.  Speech is clear.  Her affect is bright. She is alert, cooperative, and pleasant.  Follows commands without difficulty.  Answers my questions appropriately.  Cranial nerves and coordination are grossly intact.  Her reflexes are diminished in the lower extremities.  There is no abnormal tone, clonus, or  tremors.  She has good strength at hip flexors, knee extensors, dorsiflexors, and plantar flexors.  Her hip extensors are slightly weak and she has some difficulty exiting a chair, although she does have some knee pain as she exits as well.  Her gait is slightly mildly unstable.  She does use a walker for community distances and a cane in the home.  Flexion and extension of the lumbar spine does not aggravate pain. Flexion and extension at the knees, she does report some mild discomfort.  She has crepitus in both knees bilaterally without effusion.  Examining her right upper extremity, her shoulder has very good range of motion.  Now, she is able to abduct the shoulder to 180 degrees without discomfort.  IMPRESSION: 1. Status post right shoulder rotator cuff tendonitis/bursitis/frozen     shoulder, overall significantly improved, improvement in pain as     well as range of motion. 2. Lumbago, currently not a major problem at this time. 3. Bilateral knee pain.  This does interfere with transferring from     sitting to standing and also bothers her when she is sitting for a     prolonged period of time or up walking. 4. Mild balance disorder, unchanged.  PLAN:  She does not need a refill on her gabapentin.  I will refill her Percocet, she takes 5/325 tablet, a half a pill in the morning, at 1 p.m. and 6 p.m. and a full pill at bedtime.  She has been  taking her medications as prescribed.  No evidence of aberrant behavior is observed.  She reports fair relief with the medications.  Her narcotic pill count is appropriate.  Her last urine drug screen was October 25, 2010, and was consistent.  I have answered all their questions.  They are comfortable with the plan.  I will see them back next month for refill of medications and brief recheck.     Brantley Stage, M.D. Electronically Signed    DMK/MedQ D:  04/06/2011 13:22:55  T:  04/08/2011 08:17:48  Job #:  [id number]

## 2011-04-10 ENCOUNTER — Ambulatory Visit: Payer: Medicare Other | Admitting: Endocrinology

## 2011-04-17 ENCOUNTER — Ambulatory Visit (INDEPENDENT_AMBULATORY_CARE_PROVIDER_SITE_OTHER): Payer: Medicare Other | Admitting: Endocrinology

## 2011-04-17 ENCOUNTER — Encounter: Payer: Self-pay | Admitting: Endocrinology

## 2011-04-17 ENCOUNTER — Other Ambulatory Visit (INDEPENDENT_AMBULATORY_CARE_PROVIDER_SITE_OTHER): Payer: Medicare Other

## 2011-04-17 VITALS — BP 142/82 | HR 107 | Temp 97.0°F | Ht 62.0 in | Wt 205.4 lb

## 2011-04-17 DIAGNOSIS — F068 Other specified mental disorders due to known physiological condition: Secondary | ICD-10-CM

## 2011-04-17 DIAGNOSIS — E109 Type 1 diabetes mellitus without complications: Secondary | ICD-10-CM

## 2011-04-17 DIAGNOSIS — Z01419 Encounter for gynecological examination (general) (routine) without abnormal findings: Secondary | ICD-10-CM | POA: Insufficient documentation

## 2011-04-17 MED ORDER — INSULIN NPH (HUMAN) (ISOPHANE) 100 UNIT/ML ~~LOC~~ SUSP: 75.0000 [IU] | SUBCUTANEOUS | Status: DC

## 2011-04-17 NOTE — Progress Notes (Signed)
Subjective:    Patient ID: Shannon Charles, female    DOB: 1927/06/10, 78 y.o.   MRN: [id number]  HPI The state of at least three ongoing medical problems is addressed today: Pt returns for f/u of insulin-requiring DM (1997).   no cbg record, but states cbg's vary from 98-300.  It is in general higher as the day goes on Bell's palsy:  husb and son say ti is approx 50% better.   HTN:  She takes and tolerates cozaar well. Past Medical History  Diagnosis Date  . DEMENTIA 08/23/2006  . DEPRESSION 08/23/2006  . DIABETES MELLITUS, TYPE I 08/23/2006  . HYPERLIPIDEMIA 08/23/2006  . HYPERTENSION 08/23/2006  . HYPOTHYROIDISM 08/23/2006  . OSTEOARTHRITIS 08/23/2006  . RENAL INSUFFICIENCY 08/23/2006  . Personal history of colonic polyps 04/29/2007  . Lung disease, restrictive     Mild, by pulmonary function test in October 2006 with mildly reduce DLCO    No past surgical history on file.  History   Social History  . Marital Status: Married    Spouse Name: N/A    Number of Children: N/A  . Years of Education: N/A   Occupational History  . Not on file.   Social History Main Topics  . Smoking status: Never Smoker   . Smokeless tobacco: Not on file  . Alcohol Use: Not on file  . Drug Use: Not on file  . Sexually Active: Not on file   Other Topics Concern  . 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 acyclovir (ZOVIRAX) 400 MG tablet Take 400 mg by mouth 4 (four) times daily.        Marland Kitchen amitriptyline (ELAVIL) 10 MG tablet 1/2 tablet at bedtime       . buPROPion (WELLBUTRIN XL) 150 MG 24 hr tablet 2 tablets by mouth daily      . donepezil (ARICEPT) 10 MG tablet Take 10 mg by mouth daily.        . dorzolamide-timolol (COSOPT) 22.3-6.8 MG/ML ophthalmic solution Place 1 drop into the left eye daily.        . furosemide (LASIX) 40 MG tablet Take 1 tablet  (40 mg total) by mouth daily.  90 tablet  1  . gabapentin (NEURONTIN) 300 MG capsule Take 300 mg by mouth daily.       Marland Kitchen levothyroxine (SYNTHROID, LEVOTHROID) 200 MCG tablet Take 1 tablet (200 mcg total) by mouth daily.  90 tablet  3  . lisdexamfetamine (VYVANSE) 30 MG capsule Take 30 mg by mouth every morning.        Marland Kitchen losartan (COZAAR) 100 MG tablet Take 1 tablet (100 mg total) by mouth daily.  90 tablet  3  . memantine (NAMENDA) 10 MG tablet Take 1 tablet (10 mg total) by mouth 2 (two) times daily.  28 tablet  0  . oxyCODONE-acetaminophen (PERCOCET) 5-325 MG per tablet Take 1 tablet by mouth 4 (four) times daily. Dr Pamelia Hoit      . simvastatin (ZOCOR) 40 MG tablet TAKE 1 TABLET AT BEDTIME (DUE FOR PHYSICAL EXAM WITH MD)  90 tablet  2  . benzonatate (TESSALON) 100 MG capsule 1 BY MOUTH THREE TIMES A DAY AS NEEDED FOR COUGH  90 capsule  1    No Known Allergies  Family History  Problem Relation Age of Onset  . Cancer  Sister     uncertain type    BP 142/82  Pulse 107  Temp(Src) 97 F (36.1 C) (Oral)  Ht 5\' 2"  (1.575 m)  Wt 205 lb 6 oz (93.157 kg)  BMI 37.56 kg/m2  SpO2 93%  Review of Systems denies hypoglycemia and weight change.      Objective:   Physical Exam VITAL SIGNS:  See vs page GENERAL: no distress Pulses: dorsalis pedis intact bilat.   Feet: no deformity.  no ulcer on the feet.  feet are of normal color and temp.  1+ bilat leg edema.  There is bilateral onychomycosis.  the ulcer at the left heel is healed.   Neuro: sensation is intact to touch on the feet  Lab Results  Component Value Date   HGBA1C 10.9* 04/17/2011      Assessment & Plan:  DM, needs increased rx Bell's palsy, improved HTN: ? Of situational component

## 2011-04-17 NOTE — Patient Instructions (Addendum)
check your blood sugar 2 times a day.  vary the time of day when you check, between before the 3 meals, and at bedtime.  also check if you have symptoms of your blood sugar being too high or too low.  please keep a record of the readings and bring it to your next appointment here.  please call us sooner if you are having low blood sugar episodes.  If it is below 100, please write on the paper why you think this has happened.   Please make a regular physical appointment in 4 months. we'll recheck blood pressure then. blood tests are being requested for you today.  please call [phone] to hear your test results.  You will be prompted to enter the 9-digit "MRN" number that appears at the top left of this page, followed by #.  Then you will hear the message.  Change levemir to nph pen, 75 units each morning.   Refer to a gynecology specialist.  you will receive a phone call, about a day and time for an appointment. Refer to a home-health-care specialist.  you will receive a phone call.

## 2011-05-07 ENCOUNTER — Encounter: Payer: Medicare Other | Admitting: Physical Medicine and Rehabilitation

## 2011-05-09 DIAGNOSIS — J984 Other disorders of lung: Secondary | ICD-10-CM

## 2011-05-09 DIAGNOSIS — I10 Essential (primary) hypertension: Secondary | ICD-10-CM

## 2011-05-09 DIAGNOSIS — E109 Type 1 diabetes mellitus without complications: Secondary | ICD-10-CM

## 2011-05-09 DIAGNOSIS — F0390 Unspecified dementia without behavioral disturbance: Secondary | ICD-10-CM

## 2011-05-16 ENCOUNTER — Telehealth: Payer: Self-pay | Admitting: Physical Medicine and Rehabilitation

## 2011-05-16 NOTE — Telephone Encounter (Signed)
Pt needs a nurse visit before a refill can be given. Tried calling but the number we have for her doesn't work.

## 2011-05-16 NOTE — Telephone Encounter (Signed)
4 days left on oxycodone, will be out before appt.

## 2011-05-17 ENCOUNTER — Telehealth: Payer: Self-pay

## 2011-05-17 NOTE — Telephone Encounter (Signed)
HHRN called to report that her BP is trend upward, last week 150s/80s HR 95 and this week has been 170/90 HR 100, BP medication verified.

## 2011-05-17 NOTE — Telephone Encounter (Signed)
Please continue same medication for now

## 2011-05-17 NOTE — Telephone Encounter (Signed)
HHRN informed of MD's advisement.  

## 2011-05-18 ENCOUNTER — Encounter: Payer: Self-pay | Admitting: *Deleted

## 2011-05-18 ENCOUNTER — Encounter: Payer: Medicare Other | Attending: Physical Medicine and Rehabilitation | Admitting: *Deleted

## 2011-05-18 VITALS — BP 155/59 | HR 85 | Resp 18 | Ht 62.0 in | Wt 202.0 lb

## 2011-05-18 DIAGNOSIS — M25569 Pain in unspecified knee: Secondary | ICD-10-CM | POA: Insufficient documentation

## 2011-05-18 DIAGNOSIS — M25519 Pain in unspecified shoulder: Secondary | ICD-10-CM | POA: Insufficient documentation

## 2011-05-18 DIAGNOSIS — G894 Chronic pain syndrome: Secondary | ICD-10-CM

## 2011-05-18 DIAGNOSIS — M719 Bursopathy, unspecified: Secondary | ICD-10-CM | POA: Insufficient documentation

## 2011-05-18 DIAGNOSIS — M7500 Adhesive capsulitis of unspecified shoulder: Secondary | ICD-10-CM | POA: Insufficient documentation

## 2011-05-18 DIAGNOSIS — M79609 Pain in unspecified limb: Secondary | ICD-10-CM

## 2011-05-18 DIAGNOSIS — M545 Low back pain, unspecified: Secondary | ICD-10-CM

## 2011-05-18 DIAGNOSIS — M67919 Unspecified disorder of synovium and tendon, unspecified shoulder: Secondary | ICD-10-CM | POA: Insufficient documentation

## 2011-05-18 DIAGNOSIS — M549 Dorsalgia, unspecified: Secondary | ICD-10-CM | POA: Insufficient documentation

## 2011-05-18 DIAGNOSIS — R279 Unspecified lack of coordination: Secondary | ICD-10-CM | POA: Insufficient documentation

## 2011-05-18 MED ORDER — OXYCODONE-ACETAMINOPHEN 5-325 MG PO TABS: 1.0000 | ORAL_TABLET | Freq: Four times a day (QID) | ORAL | Status: DC

## 2011-05-18 NOTE — Progress Notes (Signed)
C/o achiness, cold symptoms today. Alert and oriented. Denies recent falls. Was given Opioid Informed consent to read at home and return.

## 2011-05-18 NOTE — Patient Instructions (Signed)
Read the "Opioid Informed Consent", you may return it at next visit, or we can give you another copy to sign next time you are here. Thank you!

## 2011-05-30 ENCOUNTER — Other Ambulatory Visit: Payer: Self-pay | Admitting: *Deleted

## 2011-05-30 MED ORDER — SIMVASTATIN 40 MG PO TABS: ORAL_TABLET | ORAL | Status: DC

## 2011-05-30 NOTE — Telephone Encounter (Signed)
Pt's husband called requesting refill of Zocor to [facility]. Rx sent, pt's husband informed.

## 2011-06-06 ENCOUNTER — Ambulatory Visit: Payer: Medicare Other | Admitting: Physical Medicine and Rehabilitation

## 2011-06-06 ENCOUNTER — Other Ambulatory Visit: Payer: Self-pay | Admitting: *Deleted

## 2011-06-06 MED ORDER — GABAPENTIN 300 MG PO CAPS: 300.0000 mg | ORAL_CAPSULE | Freq: Three times a day (TID) | ORAL | Status: DC

## 2011-06-12 ENCOUNTER — Other Ambulatory Visit: Payer: Self-pay | Admitting: *Deleted

## 2011-06-12 MED ORDER — FUROSEMIDE 40 MG PO TABS: 40.0000 mg | ORAL_TABLET | Freq: Every day | ORAL | Status: DC

## 2011-06-12 NOTE — Telephone Encounter (Signed)
R'cd fax from [facility] for refill of Furosemide

## 2011-06-15 ENCOUNTER — Encounter: Payer: Medicare Other | Admitting: Physical Medicine and Rehabilitation

## 2011-06-19 ENCOUNTER — Other Ambulatory Visit: Payer: Self-pay | Admitting: *Deleted

## 2011-06-19 ENCOUNTER — Encounter: Payer: Medicare Other | Attending: Physical Medicine and Rehabilitation

## 2011-06-19 MED ORDER — INSULIN PEN NEEDLE 32G X 4 MM MISC: Status: DC

## 2011-06-19 NOTE — Telephone Encounter (Signed)
Pt's spouse called requesting refill for pen needles to go to [facility]. Rx sent, Pt's spouse informed.

## 2011-06-20 ENCOUNTER — Encounter: Payer: Medicare Other | Attending: Physical Medicine and Rehabilitation | Admitting: *Deleted

## 2011-06-20 ENCOUNTER — Telehealth: Payer: Self-pay | Admitting: Physical Medicine and Rehabilitation

## 2011-06-20 ENCOUNTER — Encounter: Payer: Self-pay | Admitting: *Deleted

## 2011-06-20 VITALS — BP 147/66 | HR 91 | Resp 18 | Ht 65.0 in | Wt 216.0 lb

## 2011-06-20 DIAGNOSIS — M719 Bursopathy, unspecified: Secondary | ICD-10-CM | POA: Insufficient documentation

## 2011-06-20 DIAGNOSIS — M545 Low back pain, unspecified: Secondary | ICD-10-CM | POA: Insufficient documentation

## 2011-06-20 DIAGNOSIS — M67919 Unspecified disorder of synovium and tendon, unspecified shoulder: Secondary | ICD-10-CM | POA: Insufficient documentation

## 2011-06-20 DIAGNOSIS — R279 Unspecified lack of coordination: Secondary | ICD-10-CM | POA: Insufficient documentation

## 2011-06-20 DIAGNOSIS — M25569 Pain in unspecified knee: Secondary | ICD-10-CM | POA: Insufficient documentation

## 2011-06-20 DIAGNOSIS — M1710 Unilateral primary osteoarthritis, unspecified knee: Secondary | ICD-10-CM

## 2011-06-20 DIAGNOSIS — M7500 Adhesive capsulitis of unspecified shoulder: Secondary | ICD-10-CM | POA: Insufficient documentation

## 2011-06-20 MED ORDER — OXYCODONE-ACETAMINOPHEN 5-325 MG PO TABS: 1.0000 | ORAL_TABLET | Freq: Four times a day (QID) | ORAL | Status: DC

## 2011-06-20 NOTE — Progress Notes (Signed)
Unable to void for UDS. Husband with her today. States pain meds giving minimal relief. He has been augmenting with an Aleve once daily which seems to help a little. Patient moving slowly, moaning with change of position. Asked them to be prepared to give urine specimen next visit, and be sure to bring medicine bottle, because the medication use needs to be monitored; this is the reason for monthly visits.

## 2011-06-20 NOTE — Telephone Encounter (Signed)
Grandson called stating car battery dead. Wants to reschedule.

## 2011-06-21 ENCOUNTER — Encounter: Payer: Self-pay | Admitting: Internal Medicine

## 2011-06-21 ENCOUNTER — Encounter: Payer: Self-pay | Admitting: Physical Medicine and Rehabilitation

## 2011-06-21 ENCOUNTER — Ambulatory Visit (INDEPENDENT_AMBULATORY_CARE_PROVIDER_SITE_OTHER): Payer: Medicare Other | Admitting: Internal Medicine

## 2011-06-21 VITALS — BP 154/80 | HR 107 | Temp 98.0°F | Resp 20 | Wt 219.0 lb

## 2011-06-21 DIAGNOSIS — E109 Type 1 diabetes mellitus without complications: Secondary | ICD-10-CM

## 2011-06-21 DIAGNOSIS — R0602 Shortness of breath: Secondary | ICD-10-CM

## 2011-06-21 NOTE — Progress Notes (Signed)
Subjective:    Patient ID: Shannon Charles, female    DOB: August 30, 1927, 78 y.o.   MRN: [id number]  HPI Shannon Charles is followed by Dr. Sherrilyn Rist. She has a complex medical history including DM, CVA, Dementia. She is here with her husband and grandson. Chief Complaint is swelling in both legs. There is no progression of swelling during the day. This is a new problem. She has not had any trouble with breathing, mild cough that is non-productive. No chest pain. Her legs have been painful.   She has bruises on both heels. Not painful. Has been told that it is due to pressure. [facility] for diabetic heel ulcers with debridement and dressings. These did heel and she was released in Jan.   Past Medical History  Diagnosis Date  . DEMENTIA 08/23/2006  . DEPRESSION 08/23/2006  . DIABETES MELLITUS, TYPE I 08/23/2006  . HYPERLIPIDEMIA 08/23/2006  . HYPERTENSION 08/23/2006  . HYPOTHYROIDISM 08/23/2006  . OSTEOARTHRITIS 08/23/2006  . RENAL INSUFFICIENCY 08/23/2006  . Personal history of colonic polyps 04/29/2007  . Lung disease, restrictive     Mild, by pulmonary function test in October 2006 with mildly reduce DLCO  . Pain in limb   . Chronic pain syndrome   . Lack of coordination   . Lumbago   . Primary localized osteoarthrosis, lower leg   . Disorders of bursae and tendons in shoulder region, unspecified    No past surgical history on file. Family History  Problem Relation Age of Onset  . Cancer Sister     uncertain type   History   Social History  . Marital Status: Married    Spouse Name: N/A    Number of Children: N/A  . Years of Education: N/A   Occupational History  . Not on file.   Social History Main Topics  . Smoking status: Never Smoker   . Smokeless tobacco: Not on file  . Alcohol Use: Not on file  . Drug Use: Not on file  . Sexually Active: Not on file   Other Topics Concern  . Not on file   Social History Narrative   [name] is  now in nursing home       Review of Systems  Constitutional: Negative for fever, chills, activity change and unexpected weight change.  HENT: Negative.   Eyes: Negative.   Respiratory: Negative for cough, chest tightness, shortness of breath and wheezing.   Cardiovascular: Negative.   Gastrointestinal: Negative.   Musculoskeletal: Positive for gait problem.  Neurological: Positive for facial asymmetry and speech difficulty.       Right facial droop from previous CVA  Hematological: Negative.   Psychiatric/Behavioral: Negative.        Objective:   Physical Exam Filed Vitals:   06/21/11 1510  BP: 154/80  Pulse: 107  Temp: 98 F (36.7 C)  Resp: 20   Wt Readings from Last 3 Encounters:  06/21/11 219 lb (99.338 kg)  06/20/11 216 lb (97.977 kg)  05/18/11 202 lb (91.627 kg)   Gen';l- obese AA woman who is short of breath at rest and very short of breath with exertion Neck - no JVD in the sitting position (could not lay down - to big and too short of breath) Cor - 2+ radial pulse, RRR Pulm - decrease inspiratory volume, rales at both bases, no wheezes Ext - 2 _+ pitting edema to above the knee.  Assessment & Plan:

## 2011-06-21 NOTE — Patient Instructions (Signed)
Suspect swelling and increased shortness of breath is due to diastolic heart failure with fluid accumulation. Plan - increase furosemide to 80 mg twice a day until you return for follow up next Monday or Tuesday. Continue all your other medications.  Heels - no sign of skin breakdown .  Diabetes - may need to cut back on the sweets and calories. You have gained 17 lbs since February - some may be fluid and some may be due to over eating.  Heart Failure Heart failure (HF) is a condition in which the heart has trouble pumping blood. This means your heart does not pump blood efficiently for your body to work well. In some cases of HF, fluid may back up into your lungs or you may have swelling (edema) in your lower legs. HF is a long-term (chronic) condition. It is important for you to take good care of yourself and follow your caregiver's treatment plan. CAUSES    Health conditions:   High blood pressure (hypertension) causes the heart muscle to work harder than normal. When pressure in the blood vessels is high, the heart needs to pump (contract) with more force in order to circulate blood throughout the body. High blood pressure eventually causes the heart to become stiff and weak.   Coronary artery disease (CAD) is the buildup of cholesterol and fat (plaques) in the arteries of the heart. The blockage in the arteries deprives the heart muscle of oxygen and blood. This can cause chest pain and may lead to a heart attack. High blood pressure can also contribute to CAD.   Heart attack (myocardial infarction) occurs when 1 or more arteries in the heart become blocked. The loss of oxygen damages the muscle tissue of the heart. When this happens, part of the heart muscle dies. The injured tissue does not contract as well and weakens the heart's ability to pump blood.   Abnormal heart valves can cause HF when the heart valves do not open and close properly. This makes the heart muscle pump harder to keep  the blood flowing.   Heart muscle disease (cardiomyopathy or myocarditis) is damage to the heart muscle from a variety of causes. These can include drug or alcohol abuse, infections, or unknown reasons. These can increase the risk of HF.   Lung disease makes the heart work harder because the lungs do not work properly. This can cause a strain on the heart leading it to fail.   Diabetes increases the risk of HF. High blood sugar contributes to high fat (lipid) levels in the blood. Diabetes can also cause slow damage to tiny blood vessels that carry important nutrients to the heart muscle. When the heart does not get enough oxygen and food, it can cause the heart to become weak and stiff. This leads to a heart that does not contract efficiently.   Other diseases can contribute to HF. These include abnormal heart rhythms, thyroid problems, and low blood counts (anemia).   Unhealthy lifestyle habits:   Obesity.   Smoking.   Eating foods high in fat and cholesterol.   Eating or drinking beverages high in salt.   Drug or alcohol abuse.   Lack of exercise.  SYMPTOMS   HF symptoms may vary and can be hard to detect. Symptoms may include:  Shortness of breath with activity, such as climbing stairs.   Persistent cough.   Swelling of the feet, ankles, legs, or abdomen.   Unexplained weight gain.   Difficulty breathing when lying  flat.   Waking from sleep because of the need to sit up and get more air.   Rapid heartbeat.   Fatigue and loss of energy.   Feeling lightheaded or close to fainting.  DIAGNOSIS   A diagnosis of HF is based on your history, symptoms, physical examination, and diagnostic tests. Diagnostic tests for HF may include:  EKG.   Chest X-ray.   Blood tests.   Exercise stress test.   Blood oxygen test (arterial blood gas).   Evaluation by a heart doctor (cardiologist).   Ultrasound evaluation of the heart (echocardiogram).   Heart artery test to look  for blockages (angiogram).   Radioactive imaging to look at the heart (radionuclide test).  TREATMENT   Treatment is aimed at managing the symptoms of HF. Medicines, lifestyle changes, or surgical intervention may be necessary to treat HF.  Medicines to help treat HF may include:   Angiotensin-converting enzyme (ACE) inhibitors. These block the effects of a blood protein called angiotensin-converting enzyme. ACE inhibitors relax (dilate) the blood vessels and help lower blood pressure. This decreases the workload of the heart, slows the progression of HF, and improves symptoms.   Angiotensin receptor blockers (ARBs). These medications work similar to ACE inhibitors. ARBs may be an alternative for people who cannot tolerate an ACE inhibitor.   Aldosterone antagonists. This medication helps get rid of extra fluid from your body. This lowers the volume of blood the heart has to pump.   Water pills (diuretics). Diuretics cause the kidneys to remove salt and water from the blood. The extra fluid is removed by urination. By removing extra fluid from the body, diuretics help lower the workload of the heart and help prevent fluid buildup in the lungs so breathing is easier.   Beta blockers. These prevent the heart from beating too fast and improve heart muscle strength. Beta blockers help maintain a normal heart rate, control blood pressure, and improve HF symptoms.   Digitalis. This increases the force of the heartbeat and may be helpful to people with HF or heart rhythm problems.   Healthy lifestyle changes include:   Stopping smoking.   Eating a healthy diet. Avoid foods high in fat. Avoid foods fried in oil or made with fat. A dietician can help with healthy food choices.   Limiting how much salt you eat.   Limiting alcohol intake to no more than 1 drink per day for women and 2 drinks per day for men. Drinking more than that is harmful to your heart. If your heart has already been damaged by  alcohol or you have severe HF, drinking alcohol should be stopped completely.   Exercising as directed by your caregiver.   Surgical treatment for HF may include:   Procedures to open blocked arteries, repair damaged heart valves, or remove damaged heart muscle tissue.   A pacemaker to help heart muscle function and to control certain abnormal heart rhythms.   A defibrillator to possibly prevent sudden cardiac death.  HOME CARE INSTRUCTIONS    Activity level. Your caregiver can help you determine what type of exercise program may be helpful. It is important to maintain your strength. Pace your physical activity to avoid shortness of breath or chest pain. Rest for 1 hour before and after meals. A cardiac rehabilitation program may be helpful to some people with HF.   Diet. Eat a heart healthy diet. Food choices should be low in saturated fat and cholesterol. Talk to a dietician to learn about  heart healthy foods.   Salt intake. When you have HF, you need to limit the amount of salt you eat. Eat less than 1500 milligrams (mg) of salt per day or as recommended by your caregiver.   Weight monitoring. Weigh yourself every day. You should weigh yourself in the morning after you urinate and before you eat breakfast. Wear the same amount of clothing each time you weigh yourself. Record your weight daily. Bring your recorded weights to your clinic visits. Tell your caregiver right away if you have gained 3 lb/1.4 kg in 1 day, or 5 lb/2.3 kg in a week or whatever amount you were told to report.   Blood pressure monitoring. This should be done as directed by your caregiver. A home blood pressure cuff can be purchased at a drugstore. Record your blood pressure numbers and bring them to your clinic visits. Tell your caregiver if you become dizzy or lightheaded upon standing up.   Smoking. If you are currently a smoker, it is time to quit. Nicotine makes your heart work harder by causing your blood vessels  to constrict. Do not use nicotine gum or patches before talking to your caregiver.   Follow up. Be sure to schedule a follow-up visit with your caregiver. Keep all your appointments.  SEEK MEDICAL CARE IF:    Your weight increases by 3 lb/1.4 kg in 1 day or 5 lb/2.3 kg in a week.   You notice increasing shortness of breath that is unusual for you. This may happen during rest, sleep, or with activity.   You cough more than normal, especially with physical activity.   You notice more swelling in your hands, feet, ankles, or belly (abdomen).   You are unable to sleep because it is hard to breathe.   You cough up bloody mucus (sputum).   You begin to feel "jumping" or "fluttering" sensations (palpitations) in your chest.  SEEK IMMEDIATE MEDICAL CARE IF:    You have severe chest pain or pressure which may include symptoms such as:   Pain or pressure in the arms, neck, jaw, or back.   Feeling sweaty.   Feeling sick to your stomach (nauseous).   Feeling short of breath while at rest.   Having a fast or irregular heartbeat.   You experience stroke symptoms. These symptoms include:   Facial weakness or numbness.   Weakness or numbness in an arm, leg, or on one side of your body.   Blurred vision.   Difficulty talking or thinking.   Dizziness or fainting.   Severe headache.  THESE ARE MEDICAL EMERGENCIES. Do not wait to see if the symptoms go away. Call your local emergency services (911 in [place] DO NOT drive yourself to the hospital. IMPORTANT  Make a list of every medicine, vitamin, or herbal supplement you are taking. Keep the list with you at all times. Show it to your caregiver at every visit. Keep the list up-to-date.   Ask your caregiver or pharmacist to write an explanation of each medicine you are taking. This should include:   Why you are taking it.   The possible side effects.   The best time of day to take it.   Foods to take with it or what foods to  avoid.   When to stop taking it.  MAKE SURE YOU:    Understand these instructions.   Will watch your condition.   Will get help right away if you are not doing well or get worse.  Document Released: 03/12/2005 Document Revised: 03/01/2011 Document Reviewed: 06/24/2009 [facility] Patient Information 2012 [place], [place].

## 2011-06-24 NOTE — Assessment & Plan Note (Addendum)
Concern is for diastolic dysfunction with mild heart failure with SOB and peripheral edema. 2 D echo May '12: EF 55-60%, grade I diastolic dysfunction.  Plan - increase Lasix to 80 mg bid until next visit in 4 or 5 days.

## 2011-06-24 NOTE — Assessment & Plan Note (Signed)
Lab Results  Component Value Date   HGBA1C 10.9* 04/17/2011   Very poor control. She does have a sugar rich diet.  Plan - no change in medications           Pt and family strongly advised to avoid sugar and to follow a sound diabetic diet.

## 2011-06-26 ENCOUNTER — Ambulatory Visit: Payer: Medicare Other | Admitting: Internal Medicine

## 2011-06-26 DIAGNOSIS — Z0289 Encounter for other administrative examinations: Secondary | ICD-10-CM

## 2011-06-28 ENCOUNTER — Telehealth: Payer: Self-pay | Admitting: *Deleted

## 2011-06-28 NOTE — Telephone Encounter (Signed)
Pt's spouse called regarding rx for pen needles. Pt's spouse states that updated rx is needed for pen needles twice daily, we have on file once daily. Left message for pt to callback office.

## 2011-06-29 MED ORDER — INSULIN PEN NEEDLE 32G X 4 MM MISC: Status: DC

## 2011-06-29 NOTE — Telephone Encounter (Signed)
Pt needs rx for bid because she is using insulin pen and has to inject herself twice. Updated rx sent to [facility] for bid pen needles, pt's spouse informed rx sent.

## 2011-07-02 ENCOUNTER — Encounter: Payer: Self-pay | Admitting: Endocrinology

## 2011-07-02 ENCOUNTER — Other Ambulatory Visit (INDEPENDENT_AMBULATORY_CARE_PROVIDER_SITE_OTHER): Payer: Medicare Other

## 2011-07-02 ENCOUNTER — Ambulatory Visit (INDEPENDENT_AMBULATORY_CARE_PROVIDER_SITE_OTHER): Payer: Medicare Other | Admitting: Endocrinology

## 2011-07-02 VITALS — BP 114/72 | HR 107 | Temp 98.4°F | Wt 217.0 lb

## 2011-07-02 DIAGNOSIS — N259 Disorder resulting from impaired renal tubular function, unspecified: Secondary | ICD-10-CM

## 2011-07-02 DIAGNOSIS — E109 Type 1 diabetes mellitus without complications: Secondary | ICD-10-CM

## 2011-07-02 DIAGNOSIS — R0602 Shortness of breath: Secondary | ICD-10-CM

## 2011-07-02 LAB — BASIC METABOLIC PANEL
BUN: 26 mg/dL — ABNORMAL HIGH (ref 6–23)
GFR: 37.55 mL/min — ABNORMAL LOW (ref >60.00)
Potassium: 3.7 mEq/L (ref 3.5–5.1)

## 2011-07-02 LAB — BRAIN NATRIURETIC PEPTIDE: Pro B Natriuretic peptide (BNP): 52 pg/mL (ref 0.0–100.0)

## 2011-07-02 MED ORDER — FUROSEMIDE 80 MG PO TABS: 80.0000 mg | ORAL_TABLET | Freq: Every day | ORAL | Status: DC

## 2011-07-02 MED ORDER — METOLAZONE 2.5 MG PO TABS: 2.5000 mg | ORAL_TABLET | Freq: Every day | ORAL | Status: DC

## 2011-07-02 NOTE — Progress Notes (Signed)
Subjective:    Patient ID: Shannon Charles, female    DOB: 30-Apr-1927, 78 y.o.   MRN: [id number]  HPI Pt returns for f/u of insulin-requiring DM (1997).   no cbg record, but states cbg's vary from 78-200.  It is in general higher as the day goes on.  sxs did not improve with increasing lasix husb says pt has a few weeks of slight swelling of the legs, and slight assoc sob Past Medical History  Diagnosis Date  . DEMENTIA 08/23/2006  . DEPRESSION 08/23/2006  . DIABETES MELLITUS, TYPE I 08/23/2006  . HYPERLIPIDEMIA 08/23/2006  . HYPERTENSION 08/23/2006  . HYPOTHYROIDISM 08/23/2006  . OSTEOARTHRITIS 08/23/2006  . RENAL INSUFFICIENCY 08/23/2006  . Personal history of colonic polyps 04/29/2007  . Lung disease, restrictive     Mild, by pulmonary function test in October 2006 with mildly reduce DLCO  . Pain in limb   . Chronic pain syndrome   . Lack of coordination   . Lumbago   . Primary localized osteoarthrosis, lower leg   . Disorders of bursae and tendons in shoulder region, unspecified     No past surgical history on file.  History   Social History  . Marital Status: Married    Spouse Name: N/A    Number of Children: N/A  . Years of Education: N/A   Occupational History  . Not on file.   Social History Main Topics  . Smoking status: Never Smoker   . Smokeless tobacco: Not on file  . Alcohol Use: Not on file  . Drug Use: Not on file  . Sexually Active: Not on file   Other Topics Concern  . 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 amitriptyline (ELAVIL) 10 MG tablet 10 mg at bedtime.       Marland Kitchen aspirin 81 MG tablet Take 81 mg by mouth daily.      . benzonatate (TESSALON) 100 MG capsule 1 BY MOUTH THREE TIMES A DAY AS NEEDED FOR COUGH  90 capsule  1  . buPROPion (WELLBUTRIN XL) 150 MG 24 hr tablet 150 mg daily.       Marland Kitchen donepezil  (ARICEPT) 10 MG tablet Take 10 mg by mouth daily.        . dorzolamide-timolol (COSOPT) 22.3-6.8 MG/ML ophthalmic solution Place 1 drop into both eyes daily.       Marland Kitchen gabapentin (NEURONTIN) 300 MG capsule Take 1 capsule (300 mg total) by mouth 3 (three) times daily.  270 capsule  0  . insulin NPH (HUMULIN N PEN) 100 UNIT/ML injection Inject 75 Units into the skin every morning. And pen needles 1/day  75 mL  3  . Insulin Pen Needle 32G X 4 MM MISC Use as directed twice daily  180 each  3  . latanoprost (XALATAN) 0.005 % ophthalmic solution Place 1 drop into both eyes at bedtime.      Marland Kitchen levothyroxine (SYNTHROID, LEVOTHROID) 200 MCG tablet Take 1 tablet (200 mcg total) by mouth daily.  90 tablet  3  . lisdexamfetamine (VYVANSE) 50 MG capsule Take 50 mg by mouth every morning.      Marland Kitchen LORazepam (ATIVAN) 0.5 MG tablet Take 0.5 mg by mouth 2 (two) times daily as needed.      Marland Kitchen losartan (COZAAR) 100 MG tablet Take 1 tablet (100 mg total)  by mouth daily.  90 tablet  3  . memantine (NAMENDA) 10 MG tablet Take 1 tablet (10 mg total) by mouth 2 (two) times daily.  28 tablet  0  . olmesartan (BENICAR) 40 MG tablet Take 40 mg by mouth daily.      Marland Kitchen oxyCODONE-acetaminophen (PERCOCET) 5-325 MG per tablet Take 1 tablet by mouth 4 (four) times daily. Take 1/2 tab qam, 1/2 tab at 1pm and 4pm, 1 whole tablet at HS  75 tablet  0  . PARoxetine (PAXIL) 20 MG tablet Take 20 mg by mouth at bedtime.      . sertraline (ZOLOFT) 50 MG tablet Take 100 mg by mouth 2 (two) times daily.      . simvastatin (ZOCOR) 40 MG tablet TAKE 1 TABLET AT BEDTIME  90 tablet  2  . metolazone (ZAROXOLYN) 2.5 MG tablet Take 1 tablet (2.5 mg total) by mouth daily.  30 tablet  11    No Known Allergies  Family History  Problem Relation Age of Onset  . Cancer Sister     uncertain type    BP 114/72  Pulse 107  Temp(Src) 98.4 F (36.9 C) (Oral)  Wt 217 lb (98.431 kg)  SpO2 89%  Review of Systems Son says pt has wheezing with exertion.        Objective:   Physical Exam LUNGS:  Clear to auscultation Pulses: dorsalis pedis intact bilat.   Feet: no deformity.  no ulcer on the feet.  feet are of normal color and temp.  2+ bilat leg edema.  There is bilateral onychomycosis.  There are bilat foot healed surgical scars. Neuro: sensation is intact to touch on the feet     Assessment & Plan:  Edema, persistent DM.  Uncertain control Renal insuff.  This will have to be watched with increased diuretic rx

## 2011-07-02 NOTE — Patient Instructions (Addendum)
check your blood sugar 2 times a day.  vary the time of day when you check, between before the 3 meals, and at bedtime.  also check if you have symptoms of your blood sugar being too high or too low.  please keep a record of the readings and bring it to your next appointment here.  please call us sooner if you are having low blood sugar episodes.  If it is below 100, please write on the paper why you think this has happened.   blood tests and a chest-x-ray are being requested for you today.  You will receive a letter with results. Take furosemide 80 mg daily. Add metolazone 2.5 mg daily. Please come back for a follow-up appointment in 1 week.

## 2011-07-05 ENCOUNTER — Encounter: Payer: Self-pay | Admitting: Endocrinology

## 2011-07-05 LAB — HEMOGLOBIN A1C: Hgb A1c MFr Bld: 9.4 % — ABNORMAL HIGH (ref 4.6–6.5)

## 2011-07-06 ENCOUNTER — Telehealth: Payer: Self-pay | Admitting: *Deleted

## 2011-07-06 NOTE — Telephone Encounter (Signed)
Pt's spouse called for results of lab work. Pt's spouse informed.

## 2011-07-09 ENCOUNTER — Ambulatory Visit: Payer: Medicare Other | Admitting: Endocrinology

## 2011-07-09 ENCOUNTER — Other Ambulatory Visit: Payer: Self-pay | Admitting: *Deleted

## 2011-07-09 MED ORDER — LEVOTHYROXINE SODIUM 200 MCG PO TABS: 200.0000 ug | ORAL_TABLET | Freq: Every day | ORAL | Status: DC

## 2011-07-09 NOTE — Telephone Encounter (Signed)
Pt needs rx for Levothyroxine sent to [facility] pharmacy and a temporary supply sent to local pharmacy since she is out and has to wait to receive mail order pharmacy. Rx sent to mail order and local pharmacy, pt's spouse informed.

## 2011-07-13 ENCOUNTER — Telehealth: Payer: Self-pay | Admitting: *Deleted

## 2011-07-13 ENCOUNTER — Telehealth: Payer: Self-pay

## 2011-07-13 NOTE — Telephone Encounter (Signed)
Pt's spouse called regarding Metolazone rx and a possible reaction to medcation. Do you want pt to stop taking the medication or continue to take the medication-they scheduled appointment for Monday.

## 2011-07-13 NOTE — Telephone Encounter (Signed)
Please come in Monday as scheduled Ok to stay-off med until then

## 2011-07-13 NOTE — Telephone Encounter (Signed)
Pt's spouse informed to stay off medication until then.

## 2011-07-13 NOTE — Telephone Encounter (Signed)
This is possible.  F/u ov is due.  Ok to come in today

## 2011-07-13 NOTE — Telephone Encounter (Signed)
Pt's grand daughter called stating pt started experiencing weakness of her arms and legs x 1 day. Grand daughter is concerned that this maybe a side effect of Metolazone 2.5 mg. Please advise, grand daughter says that pt's feet are still swollen, her abdomen is distended and she is having trouble urinating (feels like she needs to go but cannot void).

## 2011-07-13 NOTE — Telephone Encounter (Signed)
No slots available today-pt scheduled for 07/16/2011.

## 2011-07-14 ENCOUNTER — Emergency Department (HOSPITAL_COMMUNITY): Payer: Medicare Other

## 2011-07-14 ENCOUNTER — Inpatient Hospital Stay (HOSPITAL_COMMUNITY)
Admission: EM | Admit: 2011-07-14 | Discharge: 2011-07-21 | DRG: 091 | Disposition: A | Discharge status: Skilled Nursing Facility | Payer: Medicare Other | Source: Ambulatory Visit | Attending: Internal Medicine | Admitting: Internal Medicine

## 2011-07-14 ENCOUNTER — Encounter (HOSPITAL_COMMUNITY): Payer: Self-pay

## 2011-07-14 DIAGNOSIS — R61 Generalized hyperhidrosis: Secondary | ICD-10-CM

## 2011-07-14 DIAGNOSIS — G929 Unspecified toxic encephalopathy: Principal | ICD-10-CM | POA: Diagnosis present

## 2011-07-14 DIAGNOSIS — R05 Cough: Secondary | ICD-10-CM

## 2011-07-14 DIAGNOSIS — R0602 Shortness of breath: Secondary | ICD-10-CM

## 2011-07-14 DIAGNOSIS — F0390 Unspecified dementia without behavioral disturbance: Secondary | ICD-10-CM | POA: Diagnosis present

## 2011-07-14 DIAGNOSIS — IMO0002 Reserved for concepts with insufficient information to code with codable children: Secondary | ICD-10-CM | POA: Diagnosis present

## 2011-07-14 DIAGNOSIS — D649 Anemia, unspecified: Secondary | ICD-10-CM | POA: Diagnosis present

## 2011-07-14 DIAGNOSIS — E109 Type 1 diabetes mellitus without complications: Secondary | ICD-10-CM

## 2011-07-14 DIAGNOSIS — G510 Bell's palsy: Secondary | ICD-10-CM

## 2011-07-14 DIAGNOSIS — N179 Acute kidney failure, unspecified: Secondary | ICD-10-CM | POA: Diagnosis present

## 2011-07-14 DIAGNOSIS — K5900 Constipation, unspecified: Secondary | ICD-10-CM | POA: Diagnosis present

## 2011-07-14 DIAGNOSIS — R278 Other lack of coordination: Secondary | ICD-10-CM

## 2011-07-14 DIAGNOSIS — M545 Low back pain: Secondary | ICD-10-CM

## 2011-07-14 DIAGNOSIS — Z86010 Personal history of colonic polyps: Secondary | ICD-10-CM

## 2011-07-14 DIAGNOSIS — K7682 Hepatic encephalopathy: Secondary | ICD-10-CM | POA: Diagnosis present

## 2011-07-14 DIAGNOSIS — Z7982 Long term (current) use of aspirin: Secondary | ICD-10-CM

## 2011-07-14 DIAGNOSIS — E119 Type 2 diabetes mellitus without complications: Secondary | ICD-10-CM | POA: Diagnosis present

## 2011-07-14 DIAGNOSIS — R9431 Abnormal electrocardiogram [ECG] [EKG]: Secondary | ICD-10-CM

## 2011-07-14 DIAGNOSIS — K7290 Hepatic failure, unspecified without coma: Secondary | ICD-10-CM | POA: Diagnosis present

## 2011-07-14 DIAGNOSIS — G92 Toxic encephalopathy: Principal | ICD-10-CM | POA: Diagnosis present

## 2011-07-14 DIAGNOSIS — K7689 Other specified diseases of liver: Secondary | ICD-10-CM

## 2011-07-14 DIAGNOSIS — E118 Type 2 diabetes mellitus with unspecified complications: Secondary | ICD-10-CM | POA: Diagnosis present

## 2011-07-14 DIAGNOSIS — F3289 Other specified depressive episodes: Secondary | ICD-10-CM | POA: Diagnosis present

## 2011-07-14 DIAGNOSIS — E039 Hypothyroidism, unspecified: Secondary | ICD-10-CM | POA: Diagnosis present

## 2011-07-14 DIAGNOSIS — I10 Essential (primary) hypertension: Secondary | ICD-10-CM

## 2011-07-14 DIAGNOSIS — F068 Other specified mental disorders due to known physiological condition: Secondary | ICD-10-CM | POA: Diagnosis present

## 2011-07-14 DIAGNOSIS — E785 Hyperlipidemia, unspecified: Secondary | ICD-10-CM | POA: Diagnosis present

## 2011-07-14 DIAGNOSIS — E876 Hypokalemia: Secondary | ICD-10-CM | POA: Diagnosis present

## 2011-07-14 DIAGNOSIS — F329 Major depressive disorder, single episode, unspecified: Secondary | ICD-10-CM

## 2011-07-14 DIAGNOSIS — Z01419 Encounter for gynecological examination (general) (routine) without abnormal findings: Secondary | ICD-10-CM

## 2011-07-14 DIAGNOSIS — N259 Disorder resulting from impaired renal tubular function, unspecified: Secondary | ICD-10-CM

## 2011-07-14 DIAGNOSIS — L97529 Non-pressure chronic ulcer of other part of left foot with unspecified severity: Secondary | ICD-10-CM

## 2011-07-14 DIAGNOSIS — N390 Urinary tract infection, site not specified: Secondary | ICD-10-CM | POA: Diagnosis present

## 2011-07-14 DIAGNOSIS — G894 Chronic pain syndrome: Secondary | ICD-10-CM | POA: Diagnosis present

## 2011-07-14 DIAGNOSIS — R609 Edema, unspecified: Secondary | ICD-10-CM | POA: Diagnosis present

## 2011-07-14 DIAGNOSIS — R51 Headache: Secondary | ICD-10-CM

## 2011-07-14 DIAGNOSIS — M1990 Unspecified osteoarthritis, unspecified site: Secondary | ICD-10-CM

## 2011-07-14 LAB — URINALYSIS, ROUTINE W REFLEX MICROSCOPIC
Hgb urine dipstick: NEGATIVE
Nitrite: NEGATIVE
Protein, ur: NEGATIVE mg/dL
Specific Gravity, Urine: 1.016 (ref 1.005–1.030)
Urobilinogen, UA: 1 mg/dL (ref 0.0–1.0)

## 2011-07-14 LAB — POCT I-STAT 3, ART BLOOD GAS (G3+)
O2 Saturation: 95 %
pCO2 arterial: 58.2 mmHg (ref 35.0–45.0)

## 2011-07-14 LAB — CBC
Hemoglobin: 10.4 g/dL — ABNORMAL LOW (ref 12.0–15.0)
MCH: 29.2 pg (ref 26.0–34.0)
MCHC: 31.7 g/dL (ref 30.0–36.0)
MCV: 94.3 fL (ref 78.0–100.0)
MCV: 93.3 fL (ref 78.0–100.0)
Platelets: 181 10*3/uL (ref 150–400)
Platelets: 179 10*3/uL (ref 150–400)
RBC: 3.56 MIL/uL — ABNORMAL LOW (ref 3.87–5.11)
RDW: 15.4 % (ref 11.5–15.5)
WBC: 6.4 10*3/uL (ref 4.0–10.5)

## 2011-07-14 LAB — DIFFERENTIAL
Basophils Absolute: 0 10*3/uL (ref 0.0–0.1)
Basophils Relative: 0 % (ref 0–1)
Eosinophils Relative: 5 % (ref 0–5)
Lymphocytes Relative: 25 % (ref 12–46)
Neutro Abs: 3.7 10*3/uL (ref 1.7–7.7)

## 2011-07-14 LAB — HEMOGLOBIN A1C: Mean Plasma Glucose: 214 mg/dL — ABNORMAL HIGH (ref <117)

## 2011-07-14 LAB — GLUCOSE, CAPILLARY
Glucose-Capillary: 116 mg/dL — ABNORMAL HIGH (ref 70–99)
Glucose-Capillary: 118 mg/dL — ABNORMAL HIGH (ref 70–99)

## 2011-07-14 LAB — HEPATIC FUNCTION PANEL
Bilirubin, Direct: 0.3 mg/dL (ref 0.0–0.3)
Indirect Bilirubin: 0.2 mg/dL — ABNORMAL LOW (ref 0.3–0.9)
Total Bilirubin: 0.5 mg/dL (ref 0.3–1.2)

## 2011-07-14 LAB — TSH: TSH: 0.151 u[IU]/mL — ABNORMAL LOW (ref 0.350–4.500)

## 2011-07-14 LAB — AMMONIA: Ammonia: 168 umol/L — ABNORMAL HIGH (ref 11–60)

## 2011-07-14 LAB — POCT I-STAT, CHEM 8
Creatinine, Ser: 3.5 mg/dL — ABNORMAL HIGH (ref 0.50–1.10)
HCT: 35 % — ABNORMAL LOW (ref 36.0–46.0)
Hemoglobin: 11.9 g/dL — ABNORMAL LOW (ref 12.0–15.0)
Sodium: 142 mEq/L (ref 135–145)
TCO2: 30 mmol/L (ref 0–100)

## 2011-07-14 LAB — URINE MICROSCOPIC-ADD ON

## 2011-07-14 MED ORDER — OXYCODONE HCL 5 MG PO TABS
5.0000 mg | ORAL_TABLET | ORAL | Status: DC | PRN
  Administered 2011-07-14 – 2011-07-17 (×5): 5 mg via ORAL
  Filled 2011-07-14 (×5): qty 1

## 2011-07-14 MED ORDER — LACTULOSE 10 GM/15ML PO SOLN
30.0000 g | Freq: Two times a day (BID) | ORAL | Status: DC
  Administered 2011-07-14: 30 g via ORAL
  Filled 2011-07-14 (×2): qty 45

## 2011-07-14 MED ORDER — DONEPEZIL HCL 10 MG PO TABS
10.0000 mg | ORAL_TABLET | Freq: Every day | ORAL | Status: DC
  Administered 2011-07-14 – 2011-07-21 (×8): 10 mg via ORAL
  Filled 2011-07-14 (×8): qty 1

## 2011-07-14 MED ORDER — SODIUM CHLORIDE 0.9 % IV SOLN
INTRAVENOUS | Status: DC
  Administered 2011-07-14 (×2): via INTRAVENOUS

## 2011-07-14 MED ORDER — MEMANTINE HCL 10 MG PO TABS
10.0000 mg | ORAL_TABLET | Freq: Two times a day (BID) | ORAL | Status: DC
  Administered 2011-07-14 – 2011-07-21 (×14): 10 mg via ORAL
  Filled 2011-07-14 (×15): qty 1

## 2011-07-14 MED ORDER — POTASSIUM CHLORIDE 10 MEQ/100ML IV SOLN
10.0000 meq | INTRAVENOUS | Status: AC
  Administered 2011-07-14 (×2): 10 meq via INTRAVENOUS
  Filled 2011-07-14 (×2): qty 100

## 2011-07-14 MED ORDER — LATANOPROST 0.005 % OP SOLN
1.0000 [drp] | Freq: Every day | OPHTHALMIC | Status: DC
  Administered 2011-07-14 – 2011-07-20 (×7): 1 [drp] via OPHTHALMIC
  Filled 2011-07-14: qty 2.5

## 2011-07-14 MED ORDER — SODIUM CHLORIDE 0.9 % IV SOLN
INTRAVENOUS | Status: DC
  Administered 2011-07-14 – 2011-07-15 (×3): via INTRAVENOUS

## 2011-07-14 MED ORDER — ACETAMINOPHEN 650 MG RE SUPP: 650.0000 mg | Freq: Four times a day (QID) | RECTAL | Status: DC | PRN

## 2011-07-14 MED ORDER — SERTRALINE HCL 100 MG PO TABS
100.0000 mg | ORAL_TABLET | Freq: Two times a day (BID) | ORAL | Status: DC
  Administered 2011-07-14 – 2011-07-21 (×14): 100 mg via ORAL
  Filled 2011-07-14 (×20): qty 1

## 2011-07-14 MED ORDER — DEXTROSE 5 % IV SOLN
1.0000 g | Freq: Once | INTRAVENOUS | Status: AC
  Administered 2011-07-14: 1 g via INTRAVENOUS
  Filled 2011-07-14: qty 10

## 2011-07-14 MED ORDER — ONDANSETRON HCL 4 MG/2ML IJ SOLN: 4.0000 mg | Freq: Four times a day (QID) | INTRAMUSCULAR | Status: DC | PRN

## 2011-07-14 MED ORDER — ONDANSETRON HCL 4 MG PO TABS: 4.0000 mg | ORAL_TABLET | Freq: Four times a day (QID) | ORAL | Status: DC | PRN

## 2011-07-14 MED ORDER — ENOXAPARIN SODIUM 30 MG/0.3ML ~~LOC~~ SOLN
30.0000 mg | SUBCUTANEOUS | Status: DC
  Administered 2011-07-14 – 2011-07-16 (×3): 30 mg via SUBCUTANEOUS
  Filled 2011-07-14 (×4): qty 0.3

## 2011-07-14 MED ORDER — INSULIN ASPART 100 UNIT/ML ~~LOC~~ SOLN
0.0000 [IU] | SUBCUTANEOUS | Status: DC
  Administered 2011-07-14: 2 [IU] via SUBCUTANEOUS
  Administered 2011-07-14: 1 [IU] via SUBCUTANEOUS
  Administered 2011-07-15: 2 [IU] via SUBCUTANEOUS
  Administered 2011-07-15: 1 [IU] via SUBCUTANEOUS
  Administered 2011-07-15: 2 [IU] via SUBCUTANEOUS
  Administered 2011-07-15: 1 [IU] via SUBCUTANEOUS
  Administered 2011-07-15 – 2011-07-16 (×5): 2 [IU] via SUBCUTANEOUS
  Administered 2011-07-16: 1 [IU] via SUBCUTANEOUS
  Administered 2011-07-16: 2 [IU] via SUBCUTANEOUS
  Administered 2011-07-16: 3 [IU] via SUBCUTANEOUS
  Administered 2011-07-16 – 2011-07-17 (×2): 2 [IU] via SUBCUTANEOUS
  Administered 2011-07-17: 1 [IU] via SUBCUTANEOUS
  Administered 2011-07-17: 2 [IU] via SUBCUTANEOUS
  Administered 2011-07-17: 3 [IU] via SUBCUTANEOUS
  Administered 2011-07-17 – 2011-07-18 (×2): 1 [IU] via SUBCUTANEOUS
  Administered 2011-07-18: 3 [IU] via SUBCUTANEOUS
  Administered 2011-07-18 (×4): 2 [IU] via SUBCUTANEOUS
  Administered 2011-07-19: 1 [IU] via SUBCUTANEOUS
  Administered 2011-07-19 (×2): 2 [IU] via SUBCUTANEOUS
  Administered 2011-07-19: 1 [IU] via SUBCUTANEOUS

## 2011-07-14 MED ORDER — ACETAMINOPHEN 325 MG PO TABS: 650.0000 mg | ORAL_TABLET | Freq: Four times a day (QID) | ORAL | Status: DC | PRN

## 2011-07-14 MED ORDER — HYDROMORPHONE HCL PF 1 MG/ML IJ SOLN: 0.5000 mg | INTRAMUSCULAR | Status: DC | PRN

## 2011-07-14 MED ORDER — LACTULOSE 10 GM/15ML PO SOLN
30.0000 g | Freq: Three times a day (TID) | ORAL | Status: DC
  Administered 2011-07-14 – 2011-07-17 (×8): 30 g via ORAL
  Filled 2011-07-14 (×11): qty 45

## 2011-07-14 MED ORDER — LEVOTHYROXINE SODIUM 200 MCG PO TABS
200.0000 ug | ORAL_TABLET | Freq: Every day | ORAL | Status: DC
  Administered 2011-07-14: 200 ug via ORAL
  Filled 2011-07-14 (×3): qty 1

## 2011-07-14 NOTE — Progress Notes (Signed)
[facility] PROGRESS NOTE  Shannon Charles UVO:[id number] DOB: 28-Mar-1927 DOA: 07/14/2011 PCP: Romero Belling, MD, MD  Assessment/Plan: 1. Acute encephalopathy: Superimposed on dementia. CT head negative. Likely multifactorial: Acute renal failure, UTI, hyperammonemia. 2. Hyperammonemia/Hepatic encephalopathy: No history of hepatitis or hepatic disease. She does have persistent transaminitis of unclear etiology. Lactulose. Right upper quadrant ultrasound. Hepatitis panel.  3. Acute renal failure: History and clinical findings most suggestive of prerenal azotemia secondary to naproxen and diuretic use. Hold Lasix and Zaroxolyn. IV fluids. Repeat basic metabolic panel the morning. 4. Hypercapnia with metabolic compensation: Appears to be asymptomatic at this point. Etiology unclear. 5. Normocytic anemia: Appears new but likely stable given dilution. History of colonoscopy in the past. Serial CBC. Fecal occult blood. 6. Lower extremity edema: Not dramatic. Follow clinically. Although she has lower extremity edema she is apparently intravascularly dry. 2-D echocardiogram May 2012: Left ventricular ejection fraction 55-60%. Grade 1 diastolic dysfunction. 7. Hypokalemia: Replete. 8. Possible Urinary tract infection: Empiric Rocephin. Followup culture. 9. Persistent transaminitis: Present since at least June 2009. Etiology and significance unclear. Workup as above. 10. Hypothyroidism: Followup TSH. 11. Diabetes mellitus, insulin requiring, uncontrolled: Hemoglobin A1c 9.4. Sliding scale insulin. Resume Levemir when needed. 12. Depression: Continue Zoloft. 13. Dementia: Continue Aricept, Namenda  Complex case. Patient has dementia but is normally able to clean, dress herself and ambulate. Quite functional at home per her husband. She was in her usual state of health until she began metolazone. Her husband is quite definite that her symptoms began after the initiation of this medication. Her  acute metabolic encephalopathy is likely secondary to her acute renal failure possibly contributed to by a bladder infection. The etiology and significance of her elevated ammonia level is unclear. Her respiratory status appears to be stable and chest x-ray was unremarkable. There are no other signs or symptoms of infection and no focal neurologic deficits.  Code Status: Full code Family Communication: Discussed with husband at bedside. Disposition Plan: Pending further evaluation and treatment.   Brendia Sacks, MD  [facility] Pager [phone] 8AM-8PM  If 8PM-8AM, please contact floor/night-coverage [email or web address] Password [email or web address] 07/14/2011, 9:48 AM  LOS: 0 days   Brief narrative: 78 year old woman presented to the emergency department secondary to confusion and lethargy for 2 days since beginning metolazone. Weak since starting metolazone 2 days prior to admission. Asterixis?  Chart review:  07/13/2011 telephone call: Weakness of arms and legs. Concern of family for side effect of metolazone. Distention of the abdomen and edema of the feet was reported.  07/02/2011 outpatient office visit: Followup for insulin requiring diabetes mellitus. Several week history of swelling of the legs is noted. Assessment/plan: Persistent edema. Renal insufficiency.  Past medical history: Dementia, depression, insulin requiring diabetes mellitus, glaucoma, hypothyroidism, hyperlipidemia, hypertension, chronic pain syndrome  Antibiotics:  April 20-Rocephin  Interim History: Chart reviewed in detail and some rest as above. Afebrile. Borderline low blood pressure but vital signs otherwise stable.  Subjective: Patient denies complaints but history is unreliable secondary to dementia and encephalopathy.  Objective: Filed Vitals:   07/14/11 0207 07/14/11 0208 07/14/11 0432 07/14/11 0500  BP: 101/59  104/61 98/62  Pulse:   80 88  Temp: 98.6 F (37 C)   97.5 F (36.4 C)  TempSrc:  Oral   Oral  Resp: 12  14 16   Weight:    95.2 kg (209 lb 14.1 oz)  SpO2: 90% 97% 100% 97%   No intake or output data in the 24 hours ending 07/14/11  9604  Exam:   General:  Appears calm and comfortable. Alert easily and participates in conversation and examination.  Eyes: Pupils equal, round, reactive to light. Normal lids, irises. Extraocular movements intact.  ENT: Grossly normal hearing. Speech fluent and clear. Speech not entirely appropriate.  Neck: No lymphadenopathy or masses. No thyromegaly.  Cardiovascular: Regular rate and rhythm. No murmur, rub, gallop. No lower extremity edema.  Telemetry: Sinus rhythm. No arrhythmias.  Respiratory: Clear to auscultation bilaterally. No wheezes, rales, rhonchi. Normal respiratory effort.  Abdomen: Soft, nontender, nondistended.  Skin: No rash or induration seen.  Musculoskeletal: Grossly normal tone and strength in the upper and lower extremities bilaterally. Asterixis noted.  Psychiatric: Appears to be grossly normal in mood and affect.  Neurologic: Appears to be nonfocal.   Data Reviewed: Basic Metabolic Panel:  Lab 07/14/11 5409  NA 142  K 3.2*  CL 102  CO2 --  GLUCOSE 143*  BUN 70*  CREATININE 3.50*  CALCIUM --  MG --  PHOS --   Liver Function Tests:  Lab 07/14/11 0232  AST 43*  ALT 17  ALKPHOS 107  BILITOT 0.5  PROT 7.0  ALBUMIN 3.6   Lab 07/14/11 0232  AMMONIA 168*   CBC:  Lab 07/14/11 0830 07/14/11 0247  WBC 6.4 --  NEUTROABS 3.7 --  HGB 10.5* 11.9*  HCT 33.1* 35.0*  MCV 94.3 --  PLT 181 --   CBG:  Lab 07/14/11 0825 07/14/11 0652  GLUCAP 118* 116*   Studies: Ct Head Wo Contrast  07/14/2011  *RADIOLOGY REPORT*  Clinical Data: Confusion and generalized weakness.  CT HEAD WITHOUT CONTRAST  Technique:  Contiguous axial images were obtained from the base of the skull through the vertex without contrast.  Comparison: CT and MRI 12/12/2010  Findings: Mild diffuse atrophy.  No mass effect or  midline shift. No abnormal extra-axial fluid collections.  Gray-white matter junctions are distinct.  Basal cisterns are not effaced.  No evidence of acute intracranial hemorrhage.  Visualized paranasal sinuses are not opacified.  No depressed skull fractures. Vascular calcifications.  No significant change since previous study.  IMPRESSION: No acute intracranial abnormality.  Original Report Authenticated By: Marlon Pel, M.D.   Dg Chest Port 1 View  07/14/2011  *RADIOLOGY REPORT*  Clinical Data: Weakness.  Hypertension and diabetes.  PORTABLE CHEST - 1 VIEW  Comparison: 01/15/2011  Findings: Shallow inspiration.  Cardiac enlargement with some prominence of the pulmonary vascularity and perihilar infiltration suggesting edema.  No focal airspace consolidation.  No blunting of costophrenic angles.  No pneumothorax.  IMPRESSION: Cardiac enlargement with pulmonary vascular congestion and mild perihilar edema.  Original Report Authenticated By: Marlon Pel, M.D.    Scheduled Meds:   . cefTRIAXone (ROCEPHIN)  IV  1 g Intravenous Once  . enoxaparin  30 mg Subcutaneous Q24H  . insulin aspart  0-9 Units Subcutaneous Q4H  . lactulose  30 g Oral BID  . potassium chloride  10 mEq Intravenous Q1 Hr x 2   Continuous Infusions:   . sodium chloride    . DISCONTD: sodium chloride 125 mL/hr at 07/14/11 0417

## 2011-07-14 NOTE — ED Provider Notes (Addendum)
History     CSN: [id number]  Arrival date & time 07/14/11  0202   First MD Initiated Contact with Patient 07/14/11 0214      Chief Complaint  Patient presents with  . Weakness    (Consider location/radiation/quality/duration/timing/severity/associated sxs/prior treatment) HPI Level 5 Caveat: dementia. This is an 78 year old black female who started on metolazone 2.5 mg daily 2 days ago. She has been feeling generally weak ever since. This is especially worsened over about the last 12 hours. She has not had any pain. She has not complained of dyspnea. She's had no vomiting or diarrhea. When she attempts to lift her arms or legs there is a flapping motion noted; she has no history of this or of liver disease.  Past Medical History  Diagnosis Date  . DEMENTIA 08/23/2006  . DEPRESSION 08/23/2006  . DIABETES MELLITUS, TYPE I 08/23/2006  . HYPERLIPIDEMIA 08/23/2006  . HYPERTENSION 08/23/2006  . HYPOTHYROIDISM 08/23/2006  . OSTEOARTHRITIS 08/23/2006  . RENAL INSUFFICIENCY 08/23/2006  . Personal history of colonic polyps 04/29/2007  . Lung disease, restrictive     Mild, by pulmonary function test in October 2006 with mildly reduce DLCO  . Pain in limb   . Chronic pain syndrome   . Lack of coordination   . Lumbago   . Primary localized osteoarthrosis, lower leg   . Disorders of bursae and tendons in shoulder region, unspecified     History reviewed. No pertinent past surgical history.  Family History  Problem Relation Age of Onset  . Cancer Sister     uncertain type    History  Substance Use Topics  . Smoking status: Never Smoker   . Smokeless tobacco: Not on file  . Alcohol Use: Not on file    OB History    Grav Para Term Preterm Abortions TAB SAB Ect Mult Living                  Review of Systems  Unable to perform ROS   Allergies  Review of patient's allergies indicates no known allergies.  Home Medications   Current Outpatient Rx  Name Route Sig Dispense Refill   . AMITRIPTYLINE HCL 10 MG PO TABS  10 mg at bedtime.     . ASPIRIN 81 MG PO TABS Oral Take 81 mg by mouth daily.    . DONEPEZIL HCL 10 MG PO TABS Oral Take 10 mg by mouth daily.      . DORZOLAMIDE HCL-TIMOLOL MAL 22.3-6.8 MG/ML OP SOLN Both Eyes Place 1 drop into both eyes daily.     . FUROSEMIDE 80 MG PO TABS Oral Take 80 mg by mouth daily.    Marland Kitchen GABAPENTIN 300 MG PO CAPS Oral Take 300 mg by mouth 3 (three) times daily.    . INSULIN DETEMIR 100 UNIT/ML [place] SOLN Subcutaneous Inject 80 Units into the skin daily.    . INSULIN PEN NEEDLE 32G X 4 MM MISC Other 1 each by Other route as needed. Use as directed twice daily    . LATANOPROST 0.005 % OP SOLN Both Eyes Place 1 drop into both eyes at bedtime.    Marland Kitchen LEVOTHYROXINE SODIUM 200 MCG PO TABS Oral Take 200 mcg by mouth daily.    Marland Kitchen LISDEXAMFETAMINE DIMESYLATE 50 MG PO CAPS Oral Take 50 mg by mouth every morning.    Marland Kitchen LORAZEPAM 0.5 MG PO TABS Oral Take 0.5 mg by mouth 2 (two) times daily as needed. For anxiety    . LOSARTAN  POTASSIUM 100 MG PO TABS Oral Take 100 mg by mouth daily.    Marland Kitchen MEMANTINE HCL 10 MG PO TABS Oral Take 10 mg by mouth 2 (two) times daily.    Marland Kitchen METOLAZONE 2.5 MG PO TABS Oral Take 2.5 mg by mouth daily.    . ADULT MULTIVITAMIN W/MINERALS CH Oral Take 1 tablet by mouth daily.    Marland Kitchen NAPROXEN SODIUM 220 MG PO TABS Oral Take 220 mg by mouth at bedtime.    . OXYCODONE-ACETAMINOPHEN 5-325 MG PO TABS Oral Take 0.5-1 tablets by mouth 4 (four) times daily. Take 1/2 tab qam, 1/2 tab at 1pm and 4pm, 1 whole tablet at HS    . SERTRALINE HCL 50 MG PO TABS Oral Take 100 mg by mouth 2 (two) times daily.    Marland Kitchen SIMVASTATIN 40 MG PO TABS Oral Take 40 mg by mouth every evening.      BP 101/59  Temp(Src) 98.6 F (37 C) (Oral)  Resp 12  SpO2 97%  Physical Exam General: Well-developed, obese female in no acute distress; appearance consistent with age of record HENT: normocephalic, atraumatic Eyes: Right pupil 2 mm and round, left pupil about 2 mm  and irregular; lens implants present Neck: supple Heart: regular rate and rhythm Lungs: clear to auscultation bilaterally Abdomen: soft; obese; nontender Extremities: No deformity; pulses +1; trace edema of lower leg Neurologic: Awake, alert, minimally verbal; able to lift all limbs against gravity (UE > LE) but with  asterixis, strength is symmetric bilaterally Skin: Warm and dry     ED Course  Procedures (including critical care time)     MDM   Nursing notes and vitals signs, including pulse oximetry, reviewed.  Summary of this visit's results, reviewed by myself:  Labs:  Results for orders placed during the hospital encounter of 07/14/11  AMMONIA      Component Value Range   Ammonia 168 (*) 11 - 60 (umol/L)  HEPATIC FUNCTION PANEL      Component Value Range   Total Protein 7.0  6.0 - 8.3 (g/dL)   Albumin 3.6  3.5 - 5.2 (g/dL)   AST 43 (*) 0 - 37 (U/L)   ALT 17  0 - 35 (U/L)   Alkaline Phosphatase 107  39 - 117 (U/L)   Total Bilirubin 0.5  0.3 - 1.2 (mg/dL)   Bilirubin, Direct 0.3  0.0 - 0.3 (mg/dL)   Indirect Bilirubin 0.2 (*) 0.3 - 0.9 (mg/dL)  URINALYSIS, ROUTINE W REFLEX MICROSCOPIC      Component Value Range   Color, Urine YELLOW  YELLOW    APPearance CLOUDY (*) CLEAR    Specific Gravity, Urine 1.016  1.005 - 1.030    pH 5.0  5.0 - 8.0    Glucose, UA NEGATIVE  NEGATIVE (mg/dL)   Hgb urine dipstick NEGATIVE  NEGATIVE    Bilirubin Urine SMALL (*) NEGATIVE    Ketones, ur NEGATIVE  NEGATIVE (mg/dL)   Protein, ur NEGATIVE  NEGATIVE (mg/dL)   Urobilinogen, UA 1.0  0.0 - 1.0 (mg/dL)   Nitrite NEGATIVE  NEGATIVE    Leukocytes, UA MODERATE (*) NEGATIVE   POCT I-STAT, CHEM 8      Component Value Range   Sodium 142  135 - 145 (mEq/L)   Potassium 3.2 (*) 3.5 - 5.1 (mEq/L)   Chloride 102  96 - 112 (mEq/L)   BUN 70 (*) 6 - 23 (mg/dL)   Creatinine, Ser 1.61 (*) 0.50 - 1.10 (mg/dL)   Glucose, Bld 096 (*)  70 - 99 (mg/dL)   Calcium, Ion 1.61 (*) 1.12 - 1.32 (mmol/L)     TCO2 30  0 - 100 (mmol/L)   Hemoglobin 11.9 (*) 12.0 - 15.0 (g/dL)   HCT 09.6 (*) 04.5 - 46.0 (%)  POCT I-STAT 3, BLOOD GAS (G3+)      Component Value Range   pH, Arterial 7.357  7.350 - 7.400    pCO2 arterial 58.2 (*) 35.0 - 45.0 (mmHg)   pO2, Arterial 82.0  80.0 - 100.0 (mmHg)   Bicarbonate 32.6 (*) 20.0 - 24.0 (mEq/L)   TCO2 34  0 - 100 (mmol/L)   O2 Saturation 95.0     Acid-Base Excess 6.0 (*) 0.0 - 2.0 (mmol/L)   Patient temperature 98.6 F     Collection site RADIAL, ALLEN'S TEST ACCEPTABLE     Drawn by RT     Sample type ARTERIAL     Comment NOTIFIED PHYSICIAN    URINE MICROSCOPIC-ADD ON      Component Value Range   Squamous Epithelial / LPF RARE  RARE    WBC, UA 7-10  <3 (WBC/hpf)   Bacteria, UA MANY (*) RARE    Casts HYALINE CASTS (*) NEGATIVE     Imaging Studies: Ct Head Wo Contrast  07/14/2011  *RADIOLOGY REPORT*  Clinical Data: Confusion and generalized weakness.  CT HEAD WITHOUT CONTRAST  Technique:  Contiguous axial images were obtained from the base of the skull through the vertex without contrast.  Comparison: CT and MRI 12/12/2010  Findings: Mild diffuse atrophy.  No mass effect or midline shift. No abnormal extra-axial fluid collections.  Gray-white matter junctions are distinct.  Basal cisterns are not effaced.  No evidence of acute intracranial hemorrhage.  Visualized paranasal sinuses are not opacified.  No depressed skull fractures. Vascular calcifications.  No significant change since previous study.  IMPRESSION: No acute intracranial abnormality.  Original Report Authenticated By: Marlon Pel, M.D.   Dg Chest Port 1 View  07/14/2011  *RADIOLOGY REPORT*  Clinical Data: Weakness.  Hypertension and diabetes.  PORTABLE CHEST - 1 VIEW  Comparison: 01/15/2011  Findings: Shallow inspiration.  Cardiac enlargement with some prominence of the pulmonary vascularity and perihilar infiltration suggesting edema.  No focal airspace consolidation.  No blunting of  costophrenic angles.  No pneumothorax.  IMPRESSION: Cardiac enlargement with pulmonary vascular congestion and mild perihilar edema.  Original Report Authenticated By: Marlon Pel, M.D.            Hanley Seamen, MD 07/14/11 4098  Hanley Seamen, MD 07/14/11 [phone]

## 2011-07-14 NOTE — ED Notes (Signed)
Started new Rx, metolazone 2.5 mg, two days ago and has been feeling weak ever since. No other complaints. Patient has appointment with PCP on Monday for the same. AAOx4. NAD. VSS.

## 2011-07-14 NOTE — ED Notes (Signed)
LAB REQUISITION SENT FOR URINE CULTURE AS AN ADD ON TO PREVIOUSLY SUBMITTED SAMPLE

## 2011-07-14 NOTE — H&P (Signed)
DATE OF ADMISSION:  07/14/2011  PCP:    Romero Belling, MD, MD   Chief Complaint: Confusion   HPI: Shannon Charles is an 78 y.o. female with multiple medical problems who was brought to the ED by her husband due to worsening confusion and lethargy over the past 2 days.  He gives the history because the patient is unable to at this time.  She also has dementia.  Mr. Ting reports that his wife began to have problems voiding, along with constipation, then began to have difficulty standing, and then began to flail her arms and progressed to becoming very sluggish.  He denied that she had any nausea or vomiting or fevers or chills. He reports that she had a recent medication change and had been started on metolazone 4-5 days ago.      Past Medical History  Diagnosis Date  . DEMENTIA 08/23/2006  . DEPRESSION 08/23/2006  . DIABETES MELLITUS, TYPE I 08/23/2006  . HYPERLIPIDEMIA 08/23/2006  . HYPERTENSION 08/23/2006  . HYPOTHYROIDISM 08/23/2006  . OSTEOARTHRITIS 08/23/2006  . RENAL INSUFFICIENCY 08/23/2006  . Personal history of colonic polyps 04/29/2007  . Lung disease, restrictive     Mild, by pulmonary function test in October 2006 with mildly reduce DLCO  . Pain in limb   . Chronic pain syndrome   . Lack of coordination   . Lumbago   . Primary localized osteoarthrosis, lower leg   . Disorders of bursae and tendons in shoulder region, unspecified     Past Surgical History  Procedure Date  . Total abdominal hysterectomy   . Colonoscopy w/ polypectomy     Medications:  HOME MEDS: Prior to Admission medications   Medication Sig Start Date End Date Taking? Authorizing Provider  amitriptyline (ELAVIL) 10 MG tablet 10 mg at bedtime.    Yes Historical Provider, MD  aspirin 81 MG tablet Take 81 mg by mouth daily.   Yes Historical Provider, MD  donepezil (ARICEPT) 10 MG tablet Take 10 mg by mouth daily.     Yes Historical Provider, MD  dorzolamide-timolol (COSOPT) 22.3-6.8 MG/ML ophthalmic  solution Place 1 drop into both eyes daily.    Yes Historical Provider, MD  furosemide (LASIX) 80 MG tablet Take 80 mg by mouth daily. 07/02/11 07/01/12 Yes Romero Belling, MD  gabapentin (NEURONTIN) 300 MG capsule Take 300 mg by mouth 3 (three) times daily. 06/06/11  Yes Ashok Cordia, MD  insulin detemir (LEVEMIR) 100 UNIT/ML injection Inject 80 Units into the skin daily.   Yes Historical Provider, MD  Insulin Pen Needle 32G X 4 MM MISC 1 each by Other route as needed. Use as directed twice daily 06/29/11  Yes Romero Belling, MD  latanoprost (XALATAN) 0.005 % ophthalmic solution Place 1 drop into both eyes at bedtime.   Yes Historical Provider, MD  levothyroxine (SYNTHROID, LEVOTHROID) 200 MCG tablet Take 200 mcg by mouth daily. 07/09/11 07/08/12 Yes Romero Belling, MD  lisdexamfetamine (VYVANSE) 50 MG capsule Take 50 mg by mouth every morning.   Yes Historical Provider, MD  LORazepam (ATIVAN) 0.5 MG tablet Take 0.5 mg by mouth 2 (two) times daily as needed. For anxiety   Yes Historical Provider, MD  losartan (COZAAR) 100 MG tablet Take 100 mg by mouth daily. 07/20/10 07/20/11 Yes Romero Belling, MD  memantine (NAMENDA) 10 MG tablet Take 10 mg by mouth 2 (two) times daily. 03/29/11  Yes Romero Belling, MD  metolazone (ZAROXOLYN) 2.5 MG tablet Take 2.5 mg by mouth daily. 07/02/11 07/01/12 Yes  Romero Belling, MD  Multiple Vitamin (MULITIVITAMIN WITH MINERALS) TABS Take 1 tablet by mouth daily.   Yes Historical Provider, MD  naproxen sodium (ANAPROX) 220 MG tablet Take 220 mg by mouth at bedtime.   Yes Historical Provider, MD  oxyCODONE-acetaminophen (PERCOCET) 5-325 MG per tablet Take 0.5-1 tablets by mouth 4 (four) times daily. Take 1/2 tab qam, 1/2 tab at 1pm and 4pm, 1 whole tablet at [facility] 06/20/11  Yes Deanna Kirchmayer, MD  sertraline (ZOLOFT) 50 MG tablet Take 100 mg by mouth 2 (two) times daily.   Yes Historical Provider, MD  simvastatin (ZOCOR) 40 MG tablet Take 40 mg by mouth every evening.   Yes Historical Provider,  MD    Allergies:  No Known Allergies  Social History:   reports that she has never smoked. She does not have any smokeless tobacco history on file. Her alcohol and drug histories not on file.  Family History: Family History  Problem Relation Age of Onset  . Cancer Sister     uncertain type  . Cirrhosis Mother     Review of Systems: Patient unable    Physical Exam:  GEN: Obtunded Elderly Morbidly Obese African American female examined  and in no acute distress  Filed Vitals:   07/14/11 0205 07/14/11 0207 07/14/11 0208 07/14/11 0432  BP:  101/59  104/61  Pulse:    80  Temp:  98.6 F (37 C)    TempSrc:  Oral    Resp:  12  14  SpO2: 97% 90% 97% 100%   Blood pressure 104/61, pulse 80, temperature 98.6 F (37 C), temperature source Oral, resp. rate 14, SpO2 100.00%. PSYCH: She is alert and oriented x 1,  does not appear anxious does not appear depressed; affect is normal HEENT: Normocephalic and Atraumatic, Mucous membranes pink; PERRLA; EOM intact; Fundi:  Benign;  No scleral icterus, Nares: Patent, Oropharynx: Clear, Edentulous.   FROM, no cervical lymphadenopathy nor thyromegaly or carotid bruit; no JVD; Breasts:: Not examined CHEST WALL: No tenderness CHEST: Normal respiration, clear to auscultation bilaterally HEART: Regular rate and rhythm; no murmurs rubs or gallops BACK: No kyphosis or scoliosis; no CVA tenderness ABDOMEN: Positive Bowel Sounds, Obese, soft non-tender; no masses, no organomegaly.   Rectal Exam: Not done EXTREMITIES: No bone or joint deformity; age-appropriate arthropathy of the hands and knees; no cyanosis, clubbing or +2+ EDEMA of BLEs; no ulcerations. Genitalia: not examined PULSES: 2+ and symmetric SKIN: Normal hydration no rash or ulceration CNS: Obtundation,  Cranial nerves 2-12 grossly intact, Generalized Weakness, and +Asterixisis,  but no focal neurologic deficit   Labs & Imaging Results for orders placed during the hospital encounter of  07/14/11 (from the past 48 hour(s))  AMMONIA     Status: Abnormal   Collection Time   07/14/11  2:32 AM      Component Value Range Comment   Ammonia 168 (*) 11 - 60 (umol/L)   HEPATIC FUNCTION PANEL     Status: Abnormal   Collection Time   07/14/11  2:32 AM      Component Value Range Comment   Total Protein 7.0  6.0 - 8.3 (g/dL)    Albumin 3.6  3.5 - 5.2 (g/dL)    AST 43 (*) 0 - 37 (U/L)    ALT 17  0 - 35 (U/L)    Alkaline Phosphatase 107  39 - 117 (U/L)    Total Bilirubin 0.5  0.3 - 1.2 (mg/dL)    Bilirubin, Direct 0.3  0.0 -  0.3 (mg/dL)    Indirect Bilirubin 0.2 (*) 0.3 - 0.9 (mg/dL)   POCT I-STAT, CHEM 8     Status: Abnormal   Collection Time   07/14/11  2:47 AM      Component Value Range Comment   Sodium 142  135 - 145 (mEq/L)    Potassium 3.2 (*) 3.5 - 5.1 (mEq/L)    Chloride 102  96 - 112 (mEq/L)    BUN 70 (*) 6 - 23 (mg/dL)    Creatinine, Ser 1.61 (*) 0.50 - 1.10 (mg/dL)    Glucose, Bld 096 (*) 70 - 99 (mg/dL)    Calcium, Ion 0.45 (*) 1.12 - 1.32 (mmol/L)    TCO2 30  0 - 100 (mmol/L)    Hemoglobin 11.9 (*) 12.0 - 15.0 (g/dL)    HCT 40.9 (*) 81.1 - 46.0 (%)   URINALYSIS, ROUTINE W REFLEX MICROSCOPIC     Status: Abnormal   Collection Time   07/14/11  2:53 AM      Component Value Range Comment   Color, Urine YELLOW  YELLOW     APPearance CLOUDY (*) CLEAR     Specific Gravity, Urine 1.016  1.005 - 1.030     pH 5.0  5.0 - 8.0     Glucose, UA NEGATIVE  NEGATIVE (mg/dL)    Hgb urine dipstick NEGATIVE  NEGATIVE     Bilirubin Urine SMALL (*) NEGATIVE     Ketones, ur NEGATIVE  NEGATIVE (mg/dL)    Protein, ur NEGATIVE  NEGATIVE (mg/dL)    Urobilinogen, UA 1.0  0.0 - 1.0 (mg/dL)    Nitrite NEGATIVE  NEGATIVE     Leukocytes, UA MODERATE (*) NEGATIVE    URINE MICROSCOPIC-ADD ON     Status: Abnormal   Collection Time   07/14/11  2:53 AM      Component Value Range Comment   Squamous Epithelial / LPF RARE  RARE     WBC, UA 7-10  <3 (WBC/hpf)    Bacteria, UA MANY (*) RARE      Casts HYALINE CASTS (*) NEGATIVE    POCT I-STAT 3, BLOOD GAS (G3+)     Status: Abnormal   Collection Time   07/14/11  3:06 AM      Component Value Range Comment   pH, Arterial 7.357  7.350 - 7.400     pCO2 arterial 58.2 (*) 35.0 - 45.0 (mmHg)    pO2, Arterial 82.0  80.0 - 100.0 (mmHg)    Bicarbonate 32.6 (*) 20.0 - 24.0 (mEq/L)    TCO2 34  0 - 100 (mmol/L)    O2 Saturation 95.0      Acid-Base Excess 6.0 (*) 0.0 - 2.0 (mmol/L)    Patient temperature 98.6 F      Collection site RADIAL, ALLEN'S TEST ACCEPTABLE      Drawn by RT      Sample type ARTERIAL      Comment NOTIFIED PHYSICIAN      Ct Head Wo Contrast  07/14/2011  *RADIOLOGY REPORT*  Clinical Data: Confusion and generalized weakness.  CT HEAD WITHOUT CONTRAST  Technique:  Contiguous axial images were obtained from the base of the skull through the vertex without contrast.  Comparison: CT and MRI 12/12/2010  Findings: Mild diffuse atrophy.  No mass effect or midline shift. No abnormal extra-axial fluid collections.  Gray-white matter junctions are distinct.  Basal cisterns are not effaced.  No evidence of acute intracranial hemorrhage.  Visualized paranasal sinuses are not opacified.  No  depressed skull fractures. Vascular calcifications.  No significant change since previous study.  IMPRESSION: No acute intracranial abnormality.  Original Report Authenticated By: Marlon Pel, M.D.   Dg Chest Port 1 View  07/14/2011  *RADIOLOGY REPORT*  Clinical Data: Weakness.  Hypertension and diabetes.  PORTABLE CHEST - 1 VIEW  Comparison: 01/15/2011  Findings: Shallow inspiration.  Cardiac enlargement with some prominence of the pulmonary vascularity and perihilar infiltration suggesting edema.  No focal airspace consolidation.  No blunting of costophrenic angles.  No pneumothorax.  IMPRESSION: Cardiac enlargement with pulmonary vascular congestion and mild perihilar edema.  Original Report Authenticated By: Marlon Pel, M.D.       Assessment: Present on Admission:  .Toxic metabolic encephalopathy .ARF (acute renal failure) .UTI (lower urinary tract infection) .Constipation .HYPERLIPIDEMIA .Hypokalemia .HYPOTHYROIDISM .Edema .Diabetes mellitus .DEMENTIA    Plan:    Admit to Telemetry Bed. Continuous Pulse Oximetry Gentle IVFs for Rehydration' Lactulose 30 grams BID X 3 doses Discontinue Metolazone, and hold diuretics at this time.   Monitor NH3 levels Monitor Bun/Cr levels Replete K+ SSI coverage PRN with CBG checks q 4 hours DVT prophylaxis Other plans as per orders.     CODE STATUS:      FULL CODE   Critical care time: 60 minutes.   Tillie Viverette C 07/14/2011, 6:07 AM

## 2011-07-14 NOTE — ED Notes (Signed)
Patient transported to CT 

## 2011-07-15 ENCOUNTER — Inpatient Hospital Stay (HOSPITAL_COMMUNITY): Payer: Medicare Other

## 2011-07-15 LAB — GLUCOSE, CAPILLARY
Glucose-Capillary: 132 mg/dL — ABNORMAL HIGH (ref 70–99)
Glucose-Capillary: 152 mg/dL — ABNORMAL HIGH (ref 70–99)
Glucose-Capillary: 166 mg/dL — ABNORMAL HIGH (ref 70–99)

## 2011-07-15 LAB — COMPREHENSIVE METABOLIC PANEL
ALT: 17 U/L (ref 0–35)
Alkaline Phosphatase: 100 U/L (ref 39–117)
CO2: 30 mEq/L (ref 19–32)
Chloride: 101 mEq/L (ref 96–112)
GFR calc Af Amer: 20 mL/min — ABNORMAL LOW (ref >90)
GFR calc non Af Amer: 17 mL/min — ABNORMAL LOW (ref >90)
Glucose, Bld: 151 mg/dL — ABNORMAL HIGH (ref 70–99)
Potassium: 3.3 mEq/L — ABNORMAL LOW (ref 3.5–5.1)
Sodium: 143 mEq/L (ref 135–145)

## 2011-07-15 LAB — OCCULT BLOOD X 1 CARD TO LAB, STOOL: Fecal Occult Bld: NEGATIVE

## 2011-07-15 LAB — CBC
HCT: 34.9 % — ABNORMAL LOW (ref 36.0–46.0)
Hemoglobin: 11.1 g/dL — ABNORMAL LOW (ref 12.0–15.0)
WBC: 7.9 10*3/uL (ref 4.0–10.5)

## 2011-07-15 LAB — HEPATITIS PANEL, ACUTE: Hep B C IgM: NEGATIVE

## 2011-07-15 MED ORDER — HYDRALAZINE HCL 20 MG/ML IJ SOLN
10.0000 mg | Freq: Four times a day (QID) | INTRAMUSCULAR | Status: DC | PRN
  Administered 2011-07-15 – 2011-07-18 (×8): 10 mg via INTRAVENOUS
  Filled 2011-07-15 (×7): qty 0.5

## 2011-07-15 MED ORDER — LEVOTHYROXINE SODIUM 175 MCG PO TABS
175.0000 ug | ORAL_TABLET | Freq: Every day | ORAL | Status: DC
  Administered 2011-07-16 – 2011-07-21 (×6): 175 ug via ORAL
  Filled 2011-07-15 (×9): qty 1

## 2011-07-15 MED ORDER — LORAZEPAM 0.5 MG PO TABS
0.5000 mg | ORAL_TABLET | Freq: Four times a day (QID) | ORAL | Status: DC | PRN
  Administered 2011-07-15: 0.5 mg via ORAL
  Filled 2011-07-15: qty 1

## 2011-07-15 MED ORDER — FLEET ENEMA 7-19 GM/118ML RE ENEM
1.0000 | ENEMA | Freq: Every day | RECTAL | Status: DC | PRN
  Administered 2011-07-15: 1 via RECTAL
  Filled 2011-07-15: qty 1

## 2011-07-15 MED ORDER — POTASSIUM CHLORIDE CRYS ER 20 MEQ PO TBCR
40.0000 meq | EXTENDED_RELEASE_TABLET | Freq: Two times a day (BID) | ORAL | Status: AC
  Administered 2011-07-15 (×2): 40 meq via ORAL
  Filled 2011-07-15 (×2): qty 2

## 2011-07-15 NOTE — Progress Notes (Signed)
[facility] PROGRESS NOTE  Shannon Charles HYQ:[id number] DOB: 1928-01-10 DOA: 07/14/2011 PCP: Romero Belling, MD, MD  Assessment/Plan: 1. Acute encephalopathy: Much improved today. Superimposed on dementia. CT head negative. Likely multifactorial: Acute renal failure, UTI, hyperammonemia. 2. Hyperammonemia/Hepatic encephalopathy: No history of hepatitis or hepatic disease but ultrasound suggestive of cirrhosis. She does have persistent transaminitis of unclear etiology. Ammonia decreased. Continue Lactulose. Acute hepatitis panel negative.  3. Acute renal failure: Improving with IV fluids. History and clinical findings most suggestive of prerenal azotemia secondary to naproxen and diuretic use. Hold Lasix and Zaroxolyn. Repeat basic metabolic panel the morning. 4. Hypercapnia with metabolic compensation: Appears to be asymptomatic at this point. Etiology unclear. 5. Normocytic anemia: Stable. No signs of bleeding. Followup as outpatient. 6. Lower extremity edema: Not dramatic. Follow clinically. Although she has lower extremity edema she is apparently intravascularly dry. 2-D echocardiogram May 2012: Left ventricular ejection fraction 55-60%. Grade 1 diastolic dysfunction. 7. Hypokalemia: Replete. 8. Possible Urinary tract infection: Empiric Rocephin. Followup culture. 9. Persistent transaminitis: Present since at least June 2009. Etiology and significance unclear. Workup as above. 10. Hypothyroidism: TSH low. Decrease Synthroid. 11. Diabetes mellitus, insulin requiring, uncontrolled: Hemoglobin A1c 9.4. Stable as an inpatient. Sliding scale insulin. Resume Levemir when needed. 12. Depression: Continue Zoloft. 13. Dementia: Continue Aricept, Namenda  Much improved today. Discussed with husband at bedside. Enema for constipation.  Code Status: Full code Family Communication: Discussed with husband at bedside. Disposition Plan: Pending further evaluation and treatment.  Brendia Sacks, MD  [facility] Pager [phone] 8AM-8PM  If 8PM-8AM, please contact floor/night-coverage [email or web address] Password [email or web address] 07/15/2011, 10:26 AM  LOS: 1 day   Brief narrative: 78 year old woman presented to the emergency department secondary to confusion and lethargy for 2 days since beginning metolazone.  Chart review:  07/13/2011 telephone call: Weakness of arms and legs. Concern of family for side effect of metolazone. Distention of the abdomen and edema of the feet was reported.  07/02/2011 outpatient office visit: Followup for insulin requiring diabetes mellitus. Several week history of swelling of the legs is noted. Assessment/plan: Persistent edema. Renal insufficiency.  Past medical history: Dementia, depression, insulin requiring diabetes mellitus, glaucoma, hypothyroidism, hyperlipidemia, hypertension, chronic pain syndrome  Antibiotics:  April 20-Rocephin  Interim History: No interval documentation. Remains afebrile. Vital signs stable.  Subjective: Complains of constipation.  Objective: Filed Vitals:   07/15/11 0142 07/15/11 0506 07/15/11 0645 07/15/11 0900  BP: 131/69 171/82 160/71 153/70  Pulse: 94 99 105 100  Temp: 98.1 F (36.7 C) 99 F (37.2 C)  97.3 F (36.3 C)  TempSrc: Oral Axillary  Oral  Resp: 20 20  22   Height:      Weight:      SpO2: 97% 92%  97%    Intake/Output Summary (Last 24 hours) at 07/15/11 1026 Last data filed at 07/15/11 0900  Gross per 24 hour  Intake   2170 ml  Output      0 ml  Net   2170 ml    Exam:   General:  Appears calm and comfortable. Much more alert and conversant today.  Cardiovascular: Regular rate and rhythm. No murmur, rub, gallop.   Respiratory: Clear to auscultation bilaterally. No wheezes, rales, rhonchi. Normal respiratory effort.  Abdomen: Soft, nontender, nondistended.  Musculoskeletal: Grossly normal tone and strength in the upper and lower extremities bilaterally. Moves  extremities to command. Minimal asterixis noted.  Psychiatric: Appears to be grossly normal in mood and affect.  Data Reviewed: Basic Metabolic  Panel:  Lab 07/15/11 0700 07/14/11 0247  NA 143 142  K 3.3* 3.2*  CL 101 102  CO2 30 --  GLUCOSE 151* 143*  BUN 66* 70*  CREATININE 2.48* 3.50*  CALCIUM 9.0 --  MG -- --  PHOS -- --   Liver Function Tests:  Lab 07/15/11 0700 07/14/11 0232  AST 34 43*  ALT 17 17  ALKPHOS 100 107  BILITOT 0.6 0.5  PROT 6.9 7.0  ALBUMIN 3.5 3.6    Lab 07/15/11 0700 07/14/11 0232  AMMONIA 96* 168*   CBC:  Lab 07/15/11 0700 07/14/11 1643 07/14/11 0830 07/14/11 0247  WBC 7.9 7.2 6.4 --  NEUTROABS -- -- 3.7 --  HGB 11.1* 10.4* 10.5* 11.9*  HCT 34.9* 33.2* 33.1* 35.0*  MCV 91.6 93.3 94.3 --  PLT 180 179 181 --   CBG:  Lab 07/15/11 0811 07/15/11 0358 07/14/11 2343 07/14/11 2051 07/14/11 1650  GLUCAP 132* 131* 154* 144* 145*   Studies: Ct Head Wo Contrast  07/14/2011  *RADIOLOGY REPORT*  Clinical Data: Confusion and generalized weakness.  CT HEAD WITHOUT CONTRAST  Technique:  Contiguous axial images were obtained from the base of the skull through the vertex without contrast.  Comparison: CT and MRI 12/12/2010  Findings: Mild diffuse atrophy.  No mass effect or midline shift. No abnormal extra-axial fluid collections.  Gray-white matter junctions are distinct.  Basal cisterns are not effaced.  No evidence of acute intracranial hemorrhage.  Visualized paranasal sinuses are not opacified.  No depressed skull fractures. Vascular calcifications.  No significant change since previous study.  IMPRESSION: No acute intracranial abnormality.  Original Report Authenticated By: Marlon Pel, M.D.   Dg Chest Port 1 View  07/14/2011  *RADIOLOGY REPORT*  Clinical Data: Weakness.  Hypertension and diabetes.  PORTABLE CHEST - 1 VIEW  Comparison: 01/15/2011  Findings: Shallow inspiration.  Cardiac enlargement with some prominence of the pulmonary vascularity  and perihilar infiltration suggesting edema.  No focal airspace consolidation.  No blunting of costophrenic angles.  No pneumothorax.  IMPRESSION: Cardiac enlargement with pulmonary vascular congestion and mild perihilar edema.  Original Report Authenticated By: Marlon Pel, M.D.   Scheduled Meds:    . cefTRIAXone (ROCEPHIN)  IV  1 g Intravenous Once  . donepezil  10 mg Oral Daily  . enoxaparin  30 mg Subcutaneous Q24H  . insulin aspart  0-9 Units Subcutaneous Q4H  . lactulose  30 g Oral TID  . latanoprost  1 drop Both Eyes QHS  . levothyroxine  200 mcg Oral Daily  . memantine  10 mg Oral BID  . sertraline  100 mg Oral BID  . DISCONTD: lactulose  30 g Oral BID   Continuous Infusions:    . sodium chloride 75 mL/hr at 07/15/11 0757

## 2011-07-16 ENCOUNTER — Ambulatory Visit: Payer: Medicare Other | Admitting: Endocrinology

## 2011-07-16 DIAGNOSIS — Z0289 Encounter for other administrative examinations: Secondary | ICD-10-CM

## 2011-07-16 LAB — GLUCOSE, CAPILLARY
Glucose-Capillary: 178 mg/dL — ABNORMAL HIGH (ref 70–99)
Glucose-Capillary: 181 mg/dL — ABNORMAL HIGH (ref 70–99)
Glucose-Capillary: 138 mg/dL — ABNORMAL HIGH (ref 70–99)

## 2011-07-16 LAB — BASIC METABOLIC PANEL
CO2: 24 mEq/L (ref 19–32)
Calcium: 9.1 mg/dL (ref 8.4–10.5)
Creatinine, Ser: 1.81 mg/dL — ABNORMAL HIGH (ref 0.50–1.10)

## 2011-07-16 LAB — URINE CULTURE: Colony Count: >=100000

## 2011-07-16 MED ORDER — SODIUM CHLORIDE 0.9 % IV SOLN
INTRAVENOUS | Status: AC
  Administered 2011-07-16: 14:00:00 via INTRAVENOUS

## 2011-07-16 MED ORDER — RIFAXIMIN 550 MG PO TABS
550.0000 mg | ORAL_TABLET | Freq: Two times a day (BID) | ORAL | Status: DC
  Administered 2011-07-16 – 2011-07-21 (×11): 550 mg via ORAL
  Filled 2011-07-16 (×13): qty 1

## 2011-07-16 MED ORDER — CIPROFLOXACIN IN D5W 400 MG/200ML IV SOLN
400.0000 mg | Freq: Every day | INTRAVENOUS | Status: DC
  Administered 2011-07-16: 400 mg via INTRAVENOUS
  Filled 2011-07-16: qty 200

## 2011-07-16 MED ORDER — DEXTROSE 5 % IV SOLN
1.0000 g | INTRAVENOUS | Status: DC
  Administered 2011-07-16 – 2011-07-19 (×4): 1 g via INTRAVENOUS
  Filled 2011-07-16 (×5): qty 10

## 2011-07-16 MED ORDER — METOPROLOL TARTRATE 1 MG/ML IV SOLN
5.0000 mg | Freq: Once | INTRAVENOUS | Status: AC
  Administered 2011-07-16: 5 mg via INTRAVENOUS
  Filled 2011-07-16: qty 5

## 2011-07-16 NOTE — Progress Notes (Signed)
   CARE MANAGEMENT NOTE 07/16/2011  Patient:  LEISEL, PINETTE   Account Number:  [id number]  Date Initiated:  07/16/2011  Documentation initiated by:  Letha Cape  Subjective/Objective Assessment:   dx toxic metabolic encephalopathy  admit- lives with spouse.     Action/Plan:   pt/ot eval   Anticipated DC Date:  07/18/2011   Anticipated DC Plan:  SKILLED NURSING FACILITY  In-house referral  Clinical Social Worker      DC Planning Services  CM consult      Choice offered to / List presented to:             Status of service:  In process, will continue to follow Medicare Important Message given?   (If response is "NO", the following Medicare IM given date fields will be blank) Date Medicare IM given:   Date Additional Medicare IM given:    Discharge Disposition:    Per UR Regulation:    If discussed at Long Length of Stay Meetings, dates discussed:    Comments:  07/16/11 16:12 Letha Cape RN, BSN [phone] patient lives with spouse, await pt/ot eval, Possible for ? snf, CSW aware of possiblity.

## 2011-07-16 NOTE — Progress Notes (Signed)
[facility] notified that patient had 850 cc of output when foley catheter placed.  She has also had no drop in BP within an hour since hydralazine IV was administered, and she has also began to get tachy at 114 bpm which is a new high for her this admission.  Low grade fever at 99.1.  Lynch, N.P. Placed new orders.  Will continue to monitor patient.  Tabytha Gradillas, Joan Mayans, RN

## 2011-07-16 NOTE — Progress Notes (Signed)
Clinical Social Work  CSW is aware of consult for possible placement. Patient has [employer] for [employer] and PT/OT evaluations are needed for insurance approval. CSW text paged MD and requested these be completed to determine disposition for patient. CSW will follow up after evaluations.  [place], [place] [phone] (Coverage)

## 2011-07-16 NOTE — Progress Notes (Addendum)
INITIAL ADULT NUTRITION ASSESSMENT Date: 07/16/2011   Time: 12:25 PM Reason for Assessment: Low Braden score  ASSESSMENT: Female 78 y.o.  Dx: Toxic metabolic encephalopathy  Hx:  Past Medical History  Diagnosis Date  . DEMENTIA 08/23/2006  . DEPRESSION 08/23/2006  . DIABETES MELLITUS, TYPE I 08/23/2006  . HYPERLIPIDEMIA 08/23/2006  . HYPERTENSION 08/23/2006  . HYPOTHYROIDISM 08/23/2006  . OSTEOARTHRITIS 08/23/2006  . RENAL INSUFFICIENCY 08/23/2006  . Personal history of colonic polyps 04/29/2007  . Lung disease, restrictive     Mild, by pulmonary function test in October 2006 with mildly reduce DLCO  . Pain in limb   . Chronic pain syndrome   . Lack of coordination   . Lumbago   . Primary localized osteoarthrosis, lower leg   . Disorders of bursae and tendons in shoulder region, unspecified     Related Meds:     . cefTRIAXone (ROCEPHIN)  IV  1 g Intravenous Q24H  . donepezil  10 mg Oral Daily  . enoxaparin  30 mg Subcutaneous Q24H  . insulin aspart  0-9 Units Subcutaneous Q4H  . lactulose  30 g Oral TID  . latanoprost  1 drop Both Eyes QHS  . levothyroxine  175 mcg Oral Daily  . memantine  10 mg Oral BID  . metoprolol  5 mg Intravenous Once  . potassium chloride  40 mEq Oral BID  . rifaximin  550 mg Oral BID  . sertraline  100 mg Oral BID  . DISCONTD: ciprofloxacin  400 mg Intravenous QHS  . DISCONTD: levothyroxine  200 mcg Oral Daily     Ht: 5\' 2"  (157.5 cm)  Wt: 209 lb 14.1 oz (95.2 kg)  Ideal Wt: 50 kg % Ideal Wt: 190%  Usual Wt: unknown, husband reports he think she is stable weight % Usual Wt: --  Body mass index is 38.39 kg/(m^2). Obeity  Food/Nutrition Related Hx: Husband states pt with normal appetite PTA, thinks she has been stable weight maybe gaining weight. He provides her with meals daily.  Labs:  CMP     Component Value Date/Time   NA 145 07/16/2011 0516   K 3.9 07/16/2011 0516   CL 106 07/16/2011 0516   CO2 24 07/16/2011 0516   GLUCOSE 214*  07/16/2011 0516   BUN 60* 07/16/2011 0516   CREATININE 1.81* 07/16/2011 0516   CALCIUM 9.1 07/16/2011 0516   CALCIUM 9.4 12/22/2009 2322   PROT 6.9 07/15/2011 0700   ALBUMIN 3.5 07/15/2011 0700   AST 34 07/15/2011 0700   ALT 17 07/15/2011 0700   ALKPHOS 100 07/15/2011 0700   BILITOT 0.6 07/15/2011 0700   GFRNONAA 25* 07/16/2011 0516   GFRAA 29* 07/16/2011 0516   CBG (last 3)   Basename 07/16/11 1213 07/16/11 0747 07/16/11 0519  GLUCAP 138* 181* 178*    Lab Results  Component Value Date   HGBA1C 9.1* 07/14/2011      Intake/Output Summary (Last 24 hours) at 07/16/11 1228 Last data filed at 07/16/11 0600  Gross per 24 hour  Intake 2673.75 ml  Output   1100 ml  Net 1573.75 ml      Diet Order: Cardiac  Supplements/Tube Feeding: none  IVF:    sodium chloride Last Rate: 75 mL/hr at 07/15/11 2233    Estimated Nutritional Needs:   Kcal: 1650-1800  Protein: 50-60 gm Fluid:  1.7-1.8 L  Pt admitted with AMS, likely 2/2 UTI, renal failure hyperammonemia. Pt has a current Braden score or 11, pt at increased risk for  skin breakdown. Pt does not have any pressure ulcers documented, per notes pt was  Pt ate 100% x 2 meals yesterday. Nutrition intake is likely adequate.   NUTRITION DIAGNOSIS: -Altered nutrition-related laboratory values (glucose) (NI-2.2).  Status: Ongoing  RELATED TO: DM, not on DM diet at home or for this admission  AS EVIDENCE BY: glucose of 214.   MONITORING/EVALUATION(Goals): Goal: glucose levels will be WNL  Monitor: PO intake, weights, labs I/O's, education needs  EDUCATION NEEDS: -Education not appropriate at this time, pt has dementia and was sleeping at time of RD visit.  RD will address education once pt is more alert.  INTERVENTION: 1. Recommend changing diet to Carb Mod medium with sodium restrictions.  2. RD will continue to follow  Dietitian [phone]  DOCUMENTATION CODES Per approved criteria  -Obesity Unspecified    Clarene Duke  MARIE 07/16/2011, 12:25 PM

## 2011-07-16 NOTE — Progress Notes (Signed)
PHARMACY - ANTIBIOTIC CONSULT  Initial Consult Note  Pharmacy Consult for: Cipro  Indication: R/o UTI   Patient Data:   Allergies: No Known Allergies  Patient Measurements: Height: 5\' 2"  (157.5 cm) Weight: 209 lb 14.1 oz (95.2 kg) IBW/kg (Calculated) : 50.1    Vital Signs: Temp:  [97.3 F (36.3 C)-99.1 F (37.3 C)] 99.1 F (37.3 C) (04/22 0324) Pulse Rate:  [99-114] 114  (04/22 0324) Resp:  [20-24] 20  (04/22 0324) BP: (153-205)/(66-82) 161/69 mmHg (04/22 0324) SpO2:  [92 %-99 %] 99 % (04/22 0210)  BMI: Estimated Body mass index is 38.39 kg/(m^2) as calculated from the following:   Height as of this encounter: 5\' 2" (1.575 m).   Weight as of this encounter: 209 lb 14.1 oz(95.2 kg).  Intake/Output from previous day:  Intake/Output Summary (Last 24 hours) at 07/16/11 0341 Last data filed at 07/16/11 0001  Gross per 24 hour  Intake   2520 ml  Output   1100 ml  Net   1420 ml    Labs:  Basename 07/15/11 0700 07/14/11 1643 07/14/11 0830 07/14/11 0247  WBC 7.9 7.2 6.4 --  HGB 11.1* 10.4* 10.5* --  PLT 180 179 181 --  LABCREA -- -- -- --  CREATININE 2.48* -- -- 3.50*   Estimated Creatinine Clearance: 19.1 ml/min (by C-G formula based on Cr of 2.48). No results found for this basename: VANCOTROUGH:2,VANCOPEAK:2,VANCORANDOM:2,GENTTROUGH:2,GENTPEAK:2,GENTRANDOM:2,TOBRATROUGH:2,TOBRAPEAK:2,TOBRARND:2,AMIKACINPEAK:2,AMIKACINTROU:2,AMIKACIN:2, in the last 72 hours   Microbiology: Recent Results (from the past 720 hour(s))  URINE CULTURE     Status: Normal (Preliminary result)   Collection Time   07/14/11  2:53 AM      Component Value Range Status Comment   Specimen Description URINE, CATHETERIZED   Final    Special Requests NONE   Final    Culture  Setup Time [id number]   Final    Colony Count >=100,000 COLONIES/ML   Final    Culture GRAM NEGATIVE RODS   Final    Report Status PENDING   Incomplete     Medical History: Past Medical History  Diagnosis Date  .  DEMENTIA 08/23/2006  . DEPRESSION 08/23/2006  . DIABETES MELLITUS, TYPE I 08/23/2006  . HYPERLIPIDEMIA 08/23/2006  . HYPERTENSION 08/23/2006  . HYPOTHYROIDISM 08/23/2006  . OSTEOARTHRITIS 08/23/2006  . RENAL INSUFFICIENCY 08/23/2006  . Personal history of colonic polyps 04/29/2007  . Lung disease, restrictive     Mild, by pulmonary function test in October 2006 with mildly reduce DLCO  . Pain in limb   . Chronic pain syndrome   . Lack of coordination   . Lumbago   . Primary localized osteoarthrosis, lower leg   . Disorders of bursae and tendons in shoulder region, unspecified     Scheduled Medications:     . donepezil  10 mg Oral Daily  . enoxaparin  30 mg Subcutaneous Q24H  . insulin aspart  0-9 Units Subcutaneous Q4H  . lactulose  30 g Oral TID  . latanoprost  1 drop Both Eyes QHS  . levothyroxine  175 mcg Oral Daily  . memantine  10 mg Oral BID  . metoprolol  5 mg Intravenous Once  . potassium chloride  40 mEq Oral BID  . sertraline  100 mg Oral BID  . DISCONTD: levothyroxine  200 mcg Oral Daily    Assessment:  78 y.o. female admitted on 07/14/2011, with possible UTI. Urine culture (4/20) with > 100K GNR. Pharmacy consulted to manage ciprofloxacin. Estimated CrCl 20 ml/min.   Plan:  1.  Cipro 400mg  IV Q24H.   Dineen Kid Thad Ranger, PharmD 07/16/2011, 3:41 AM

## 2011-07-16 NOTE — Progress Notes (Signed)
Utilization Review Completed.Shannon Charles T4/22/2013   

## 2011-07-16 NOTE — Progress Notes (Signed)
[facility] PROGRESS NOTE  Shannon Charles:[id number] DOB: [birth year] DOA: 07/14/2011 PCP: Romero Belling, MD, MD  Assessment/Plan: 1. Acute encephalopathy: Stable. Superimposed on dementia. CT head negative. Likely multifactorial: Acute renal failure, UTI, hyperammonemia. 2. Hyperammonemia/Hepatic encephalopathy: Clinically better although ammonia is elevated again. Continue lactulose. Add rifaximin. No history of hepatitis or hepatic disease but ultrasound suggestive of cirrhosis. She does have persistent transaminitis of unclear etiology. Acute hepatitis panel negative.  3. Acute renal failure: Continues to improve. History and clinical findings most suggestive of prerenal azotemia secondary to naproxen and diuretic use. Hold Lasix and Zaroxolyn. Repeat basic metabolic panel the morning. 4. Hypercapnia with metabolic compensation: Appears to be asymptomatic at this point. Etiology unclear. 5. Normocytic anemia: Stable. No signs of bleeding. Followup as outpatient. 6. Lower extremity edema: Not dramatic. Follow clinically. Although she has lower extremity edema she is apparently intravascularly dry. 2-D echocardiogram May 2012: Left ventricular ejection fraction 55-60%. Grade 1 diastolic dysfunction. 7. Hypokalemia: Repleted. 8. Urinary tract infection: Empiric Rocephin. Followup culture. 9. Persistent transaminitis: Present since at least June 2009. Etiology and significance unclear. Workup as above. 10. Hypothyroidism: TSH low. Decrease Synthroid. 11. Diabetes mellitus, insulin requiring, uncontrolled: Hemoglobin A1c 9.4. Stable as an inpatient. Sliding scale insulin. Resume Levemir when needed. 12. Depression: Continue Zoloft. 13. Dementia: Continue Aricept, Namenda  Not much change since yesterday. Discussed with husband and grandson the bedside.  Code Status: Full code Family Communication: Discussed with husband at bedside. Disposition Plan: Pending further evaluation  and treatment.  Brendia Sacks, MD  [facility] Pager [phone] 8AM-8PM  If 8PM-8AM, please contact floor/night-coverage [email or web address] Password [email or web address] 07/16/2011, 5:39 PM  LOS: 2 days   Brief narrative: 78 year old woman presented to the emergency department secondary to confusion and lethargy for 2 days since beginning metolazone.  Chart review:  07/13/2011 telephone call: Weakness of arms and legs. Concern of family for side effect of metolazone. Distention of the abdomen and edema of the feet was reported.  07/02/2011 outpatient office visit: Followup for insulin requiring diabetes mellitus. Several week history of swelling of the legs is noted. Assessment/plan: Persistent edema. Renal insufficiency.  Past medical history: Dementia, depression, insulin requiring diabetes mellitus, glaucoma, hypothyroidism, hyperlipidemia, hypertension, chronic pain syndrome  Antibiotics:  April 20-Rocephin  Interim History: Interval documentation reviewed. Urinary retention noted.  Subjective: Had a bowel movement this morning. Overall doing okay. Family feels that she is at baseline mentally.  Objective: Filed Vitals:   07/16/11 0546 07/16/11 0644 07/16/11 1004 07/16/11 1450  BP: 149/74 134/66 156/66 175/70  Pulse: 98 92 99 99  Temp: 99.4 F (37.4 C) 98.9 F (37.2 C) 98.9 F (37.2 C) 99.5 F (37.5 C)  TempSrc: Oral Oral Oral Oral  Resp: 20 20 20 20   Height:      Weight:      SpO2: 93% 97% 92% 99%    Intake/Output Summary (Last 24 hours) at 07/16/11 1739 Last data filed at 07/16/11 1500  Gross per 24 hour  Intake 2433.75 ml  Output   1550 ml  Net 883.75 ml    Exam:   General:  Appears calm and comfortable. Alert and conversant. Follows commands.  Cardiovascular: Regular rate and rhythm. No murmur, rub, gallop.   Respiratory: Clear to auscultation bilaterally. No wheezes, rales, rhonchi. Normal respiratory effort.  Abdomen: Soft, nontender,  nondistended.  Musculoskeletal: Grossly normal tone and strength in the upper and lower extremities bilaterally. Moves extremities to command.   Psychiatric: Appears to be grossly normal  in mood and affect.  Data Reviewed: Basic Metabolic Panel:  Lab 07/16/11 1610 07/15/11 0700 07/14/11 0247  NA 145 143 142  K 3.9 3.3* --  CL 106 101 102  CO2 24 30 --  GLUCOSE 214* 151* 143*  BUN 60* 66* 70*  CREATININE 1.81* 2.48* 3.50*  CALCIUM 9.1 9.0 --  MG -- -- --  PHOS -- -- --   Liver Function Tests:  Lab 07/15/11 0700 07/14/11 0232  AST 34 43*  ALT 17 17  ALKPHOS 100 107  BILITOT 0.6 0.5  PROT 6.9 7.0  ALBUMIN 3.5 3.6    Lab 07/16/11 0516 07/15/11 0700 07/14/11 0232  AMMONIA 139* 96* 168*   CBC:  Lab 07/15/11 0700 07/14/11 1643 07/14/11 0830 07/14/11 0247  WBC 7.9 7.2 6.4 --  NEUTROABS -- -- 3.7 --  HGB 11.1* 10.4* 10.5* 11.9*  HCT 34.9* 33.2* 33.1* 35.0*  MCV 91.6 93.3 94.3 --  PLT 180 179 181 --   CBG:  Lab 07/16/11 1213 07/16/11 0747 07/16/11 0519 07/15/11 2358 07/15/11 1957  GLUCAP 138* 181* 178* 181* 166*   Studies: Ct Head Wo Contrast  07/14/2011  *RADIOLOGY REPORT*  Clinical Data: Confusion and generalized weakness.  CT HEAD WITHOUT CONTRAST  Technique:  Contiguous axial images were obtained from the base of the skull through the vertex without contrast.  Comparison: CT and MRI 12/12/2010  Findings: Mild diffuse atrophy.  No mass effect or midline shift. No abnormal extra-axial fluid collections.  Gray-white matter junctions are distinct.  Basal cisterns are not effaced.  No evidence of acute intracranial hemorrhage.  Visualized paranasal sinuses are not opacified.  No depressed skull fractures. Vascular calcifications.  No significant change since previous study.  IMPRESSION: No acute intracranial abnormality.  Original Report Authenticated By: Marlon Pel, M.D.   Dg Chest Port 1 View  07/14/2011  *RADIOLOGY REPORT*  Clinical Data: Weakness.  Hypertension  and diabetes.  PORTABLE CHEST - 1 VIEW  Comparison: 01/15/2011  Findings: Shallow inspiration.  Cardiac enlargement with some prominence of the pulmonary vascularity and perihilar infiltration suggesting edema.  No focal airspace consolidation.  No blunting of costophrenic angles.  No pneumothorax.  IMPRESSION: Cardiac enlargement with pulmonary vascular congestion and mild perihilar edema.  Original Report Authenticated By: Marlon Pel, M.D.   Scheduled Meds:    . cefTRIAXone (ROCEPHIN)  IV  1 g Intravenous Q24H  . donepezil  10 mg Oral Daily  . enoxaparin  30 mg Subcutaneous Q24H  . insulin aspart  0-9 Units Subcutaneous Q4H  . lactulose  30 g Oral TID  . latanoprost  1 drop Both Eyes QHS  . levothyroxine  175 mcg Oral Daily  . memantine  10 mg Oral BID  . metoprolol  5 mg Intravenous Once  . potassium chloride  40 mEq Oral BID  . rifaximin  550 mg Oral BID  . sertraline  100 mg Oral BID  . DISCONTD: ciprofloxacin  400 mg Intravenous QHS   Continuous Infusions:    . sodium chloride 50 mL/hr at 07/16/11 1359  . DISCONTD: sodium chloride 75 mL/hr at 07/15/11 2233

## 2011-07-17 LAB — BASIC METABOLIC PANEL
BUN: 45 mg/dL — ABNORMAL HIGH (ref 6–23)
GFR calc Af Amer: 43 mL/min — ABNORMAL LOW (ref >90)
GFR calc non Af Amer: 37 mL/min — ABNORMAL LOW (ref >90)
Potassium: 4 mEq/L (ref 3.5–5.1)

## 2011-07-17 LAB — GLUCOSE, CAPILLARY
Glucose-Capillary: 138 mg/dL — ABNORMAL HIGH (ref 70–99)
Glucose-Capillary: 202 mg/dL — ABNORMAL HIGH (ref 70–99)
Glucose-Capillary: 156 mg/dL — ABNORMAL HIGH (ref 70–99)

## 2011-07-17 LAB — AMMONIA: Ammonia: 58 umol/L (ref 11–60)

## 2011-07-17 MED ORDER — METOPROLOL TARTRATE 12.5 MG HALF TABLET
12.5000 mg | ORAL_TABLET | Freq: Two times a day (BID) | ORAL | Status: DC
  Administered 2011-07-17 – 2011-07-21 (×8): 12.5 mg via ORAL
  Filled 2011-07-17 (×9): qty 1

## 2011-07-17 MED ORDER — SIMETHICONE 80 MG PO CHEW
80.0000 mg | CHEWABLE_TABLET | Freq: Four times a day (QID) | ORAL | Status: DC | PRN
  Administered 2011-07-17: 80 mg via ORAL
  Filled 2011-07-17: qty 1

## 2011-07-17 MED ORDER — ENOXAPARIN SODIUM 40 MG/0.4ML ~~LOC~~ SOLN
40.0000 mg | SUBCUTANEOUS | Status: DC
  Administered 2011-07-17 – 2011-07-20 (×4): 40 mg via SUBCUTANEOUS
  Filled 2011-07-17 (×5): qty 0.4

## 2011-07-17 MED ORDER — AMLODIPINE BESYLATE 5 MG PO TABS
5.0000 mg | ORAL_TABLET | Freq: Every day | ORAL | Status: DC
  Administered 2011-07-17 – 2011-07-21 (×5): 5 mg via ORAL
  Filled 2011-07-17 (×5): qty 1

## 2011-07-17 MED ORDER — SIMETHICONE 40 MG/0.6ML PO SUSP: 40.0000 mg | Freq: Four times a day (QID) | ORAL | Status: DC | PRN

## 2011-07-17 MED ORDER — LACTULOSE 10 GM/15ML PO SOLN
10.0000 g | Freq: Every day | ORAL | Status: DC
  Administered 2011-07-18 – 2011-07-20 (×3): 10 g via ORAL
  Filled 2011-07-17 (×4): qty 15

## 2011-07-17 MED ORDER — MORPHINE SULFATE 2 MG/ML IJ SOLN
1.0000 mg | INTRAMUSCULAR | Status: DC | PRN
  Administered 2011-07-18 (×2): 1 mg via INTRAVENOUS
  Filled 2011-07-17 (×2): qty 1

## 2011-07-17 NOTE — Evaluation (Signed)
Occupational Therapy Evaluation Patient Details Name: Shannon Charles MRN: [id number] DOB: [birth year] Today's Date: 07/17/2011 Time: 7829-5621 OT Time Calculation (min): 33 min  OT Assessment / Plan / Recommendation Clinical Impression  Pt admitted with generalized weakness and increased confusion. Will benefit from skilled OT in the acute setting to maximize I with ADL and ADL mobility prior to d/c home. At this time, pt is too weak to return home with assist of husband, will need SNF.     OT Assessment  Patient needs continued OT Services    Follow Up Recommendations  Skilled nursing facility (vs [facility])    Equipment Recommendations  Defer to next venue    Frequency Min 1X/week    Precautions / Restrictions Precautions Precautions: Fall Restrictions Weight Bearing Restrictions: No   Pertinent Vitals/Pain C/o "discomfort" from being in bed- has asked for different bed. Also, c/o of stomach discomfort. RN aware    ADL  Eating/Feeding: Not assessed Grooming: Performed;Minimal assistance Where Assessed - Grooming: Supported sitting Upper Body Bathing: Simulated;Maximal assistance Where Assessed - Upper Body Bathing: Sitting, bed Lower Body Bathing: Simulated;+1 Total assistance Where Assessed - Lower Body Bathing: Sit to stand from bed Upper Body Dressing: Simulated;Moderate assistance Where Assessed - Upper Body Dressing: Sitting, bed Lower Body Dressing: +1 Total assistance;Performed (don socks) Where Assessed - Lower Body Dressing: Sitting, bed Toilet Transfer: Performed;Maximal assistance (pt very anxious) Toilet Transfer Method: Stand pivot (simulated EOB to chair) Toileting - Clothing Manipulation: Not assessed Toileting - Hygiene: Performed;+1 Total assistance Where Assessed - Toileting Hygiene: Rolling right and/or left ((pt on bedpan upon therapist entry)) Tub/Shower Transfer: Not assessed ADL Comments: Pt limited by anxiety    OT Goals Acute Rehab OT Goals OT  Goal Formulation: With family Time For Goal Achievement: 07/31/11 Potential to Achieve Goals: Good ADL Goals Pt Will Perform Grooming: with supervision;Standing at sink ADL Goal: Grooming - Progress: Goal set today Pt Will Perform Upper Body Dressing: with set-up;Sitting, bed;Sitting, chair ADL Goal: Upper Body Dressing - Progress: Goal set today Pt Will Perform Lower Body Dressing: with supervision;with set-up;Sit to stand from chair;Sit to stand from bed ADL Goal: Lower Body Dressing - Progress: Goal set today Pt Will Transfer to Toilet: with supervision;with DME;Ambulation;3-in-1 ADL Goal: Toilet Transfer - Progress: Goal set today Pt Will Perform Toileting - Hygiene: Independently;Sitting on 3-in-1 or toilet;Sit to stand from 3-in-1/toilet ADL Goal: Toileting - Hygiene - Progress: Goal set today  Visit Information  Last OT Received On: 07/17/11    Subjective Data  Subjective: My bottom hurts (from laying in bed) Patient Stated Goal: Return home with husband   Prior Functioning  Home Living Lives With: Spouse Type of Home: Apartment Home Access: Level entry Home Layout: One level Bathroom Shower/Tub: Tub/shower unit;C[employer]ighter: Standard Bathroom Accessibility: Yes How Accessible: Accessible via walker (sideways) Home Adaptive Equipment: Bedside commode/3-in-1;Walker - four wheeled;Straight cane;Quad cane;Hand-held shower hose Prior Function Level of Independence: Independent Able to Take Stairs?: No Driving: No Vocation: Retired Comments: family takes patient and her husband to grocery and appt's Communication Communication:  (dementia? Pt said very few words) Dominant Hand: Right    Cognition  Overall Cognitive Status: History of cognitive impairments - at baseline (dementia) Arousal/Alertness: Awake/alert Behavior During Session: Anxious    Extremity/Trunk Assessment Right Upper Extremity Assessment RUE ROM/Strength/Tone: Unable to fully assess  (grossly appeared 3/5 at shoulders/elbows; 4/5 proximally) Left Upper Extremity Assessment LUE ROM/Strength/Tone: Unable to fully assess (grossly appeared 3/5 at shoulders/elbows; 4/5 proximally)   Mobility  Bed Mobility Bed Mobility: Rolling Right;Right Sidelying to Sit;Sitting - Scoot to [place] of Bed Rolling Right: 4: Min assist;With rail Right Sidelying to Sit: 3: Mod assist;HOB elevated (HOB at 50degrees) Sitting - Scoot to Edge of Bed: 2: Max assist           End of Session OT - End of Session Activity Tolerance:  (limited by anxiety) Patient left: in chair;with call bell/phone within reach;with family/visitor present;with nursing in room Nurse Communication: Mobility status   Ridhima Golberg 07/17/2011, 6:16 PM

## 2011-07-17 NOTE — Plan of Care (Signed)
Problem: Phase III Progression Outcomes Goal: Activity at appropriate level-compared to baseline (UP IN CHAIR FOR HEMODIALYSIS)  Outcome: Progressing OT got pt. Up in chair-tol. For about 30-40 min.

## 2011-07-17 NOTE — Progress Notes (Signed)
ANTICOAGULATION: VTE prophylaxis Dose Adjustment of Lovenox  Pharmacy Consult for Lovenox dose adjustment Indication: VTE prophylaxis  No Known Allergies  Patient Measurements: Height: 5\' 2"  (157.5 cm) Weight: 209 lb 14.1 oz (95.2 kg) IBW/kg (Calculated) : 50.1    Vital Signs: Temp: 98.7 F (37.1 C) (04/23 1004) Temp src: Oral (04/23 1004) BP: 178/69 mmHg (04/23 0526) Pulse Rate: 113  (04/23 1004) Intake/Output from previous day: 04/22 0701 - 04/23 0700 In: 50 [IV Piggyback:50] Out: 853 [Urine:400; Stool:453] Intake/Output from this shift: Total I/O In: 0  Out: 1 [Stool:1]  Labs:  Basename 07/17/11 0525 07/16/11 0516 07/15/11 0700 07/14/11 1643  WBC -- -- 7.9 7.2  HGB -- -- 11.1* 10.4*  PLT -- -- 180 179  LABCREA -- -- -- --  CREATININE 1.30* 1.81* 2.48* --   Estimated Creatinine Clearance: 36.5 ml/min (by C-G formula based on Cr of 1.3). No results found for this basename: VANCOTROUGH:2,VANCOPEAK:2,VANCORANDOM:2,GENTTROUGH:2,GENTPEAK:2,GENTRANDOM:2,TOBRATROUGH:2,TOBRAPEAK:2,TOBRARND:2,AMIKACINPEAK:2,AMIKACINTROU:2,AMIKACIN:2, in the last 72 hours   Microbiology: Recent Results (from the past 720 hour(s))  URINE CULTURE     Status: Normal   Collection Time   07/14/11  2:53 AM      Component Value Range Status Comment   Specimen Description URINE, CATHETERIZED   Final    Special Requests NONE   Final    Culture  Setup Time [id number]   Final    Colony Count >=100,000 COLONIES/ML   Final    Culture KLEBSIELLA PNEUMONIAE   Final    Report Status 07/16/2011 FINAL   Final    Organism ID, Bacteria KLEBSIELLA PNEUMONIAE   Final     Anti-infectives     Start     Dose/Rate Route Frequency Ordered Stop   07/16/11 1600   cefTRIAXone (ROCEPHIN) 1 g in dextrose 5 % 50 mL IVPB        1 g 100 mL/hr over 30 Minutes Intravenous Every 24 hours 07/16/11 0824     07/16/11 1000   rifaximin (XIFAXAN) tablet 550 mg        550 mg Oral 2 times daily 07/16/11 0826     07/16/11  0430   ciprofloxacin (CIPRO) IVPB 400 mg  Status:  Discontinued        400 mg 200 mL/hr over 60 Minutes Intravenous Daily at bedtime 07/16/11 0344 07/16/11 0824   07/14/11 2200   cefTRIAXone (ROCEPHIN) 1 g in dextrose 5 % 50 mL IVPB        1 g 100 mL/hr over 30 Minutes Intravenous  Once 07/14/11 1046 07/14/11 2233   07/14/11 0330   cefTRIAXone (ROCEPHIN) 1 g in dextrose 5 % 50 mL IVPB        1 g 100 mL/hr over 30 Minutes Intravenous  Once 07/14/11 0324 07/14/11 0447          Assessment: 78 y.o. Female currently on Lovenox 30mg  SQ q24 hrs for VTE Prophylaxis.  Initially with acute renal failure, estimated CrCl <30 ml/min thus Lovenox dose =30mg  SQ q24h.  SCr has improved.  SCr decreased to 1.30 from 2.48;  Estimated CrCl ~57ml/min.  Patient weight =95.2 kg,  Ht = 62 inches. BMI =38.5.   PLTC 180K , Hgb 11.1 on 07/15/11. Normocytic anemia stable, no signs of bleeding reported.   Goal of Therapy:  Prevention of VTE (4h heparin level goal = 0.3-0.6 units/ml)  Plan:  Lovenox order indicates Pharmacy may adjust dose. Adjust Lovenox dose to 40mg  SQ q24h.   Arman Filter , RPh 07/17/2011,10:12 AM

## 2011-07-17 NOTE — Progress Notes (Signed)
Clinical Social Work  CSW reviewed chart which stated PT/OT has been ordered. CSW spoke with RN who stated PT attempted to work with patient but patient in too much pain to complete evaluation. CSW will follow up after evaluations completed.   [place], [place] [phone]

## 2011-07-17 NOTE — Progress Notes (Signed)
[facility] PROGRESS NOTE  Shannon Charles:[id number] DOB: [birth year] DOA: 07/14/2011 PCP: Romero Belling, MD, MD  Assessment/Plan: 1. Acute encephalopathy: Appears resolved. Superimposed on dementia. CT head negative. Likely multifactorial: Acute renal failure, UTI, hyperammonemia. 2. Hyperammonemia/Hepatic encephalopathy: Ammonia now within normal limits. Decrease lactulose. Continue rifaximin. No history of hepatitis or hepatic disease but ultrasound suggestive of cirrhosis. She does have persistent transaminitis of unclear etiology. Acute hepatitis panel negative. Consider outpatient gastroenterology followup. 3. Acute renal failure: Nearly resolved. History and clinical findings most suggestive of prerenal azotemia secondary to naproxen and diuretic use. Hold Lasix and Zaroxolyn. Repeat basic metabolic panel the morning. 4. Hypertension: Poorly controlled. Start low-dose beta blocker and Norvasc. 5. Hypercapnia with metabolic compensation: Appears to be asymptomatic at this point. Etiology unclear. 6. Normocytic anemia: Stable. No signs of bleeding. Followup as outpatient. 7. Lower extremity edema: Not dramatic. Follow clinically. Although she has lower extremity edema she is apparently intravascularly dry. 2-D echocardiogram May 2012: Left ventricular ejection fraction 55-60%. Grade 1 diastolic dysfunction. 8. Hypokalemia: Repleted. 9. Urinary tract infection: Continue Rocephin.  10. Persistent transaminitis: Present since at least June 2009.  11. Hypothyroidism: TSH low. Decreased Synthroid dose. 12. Diabetes mellitus, insulin requiring, uncontrolled: Hemoglobin A1c 9.4. Stable as an inpatient. Sliding scale insulin. Resume Levemir when needed. 13. Depression: Continue Zoloft. 14. Dementia: Continue Aricept, Namenda  Overall appears improved. Acute medical issues appear to be resolving rapidly now. Main issue at this point his mobility evaluation. May need skilled nursing  facility.  Code Status: Full code Family Communication: Discussed with husband, grandson at bedside. Disposition Plan: Pending further evaluation and treatment.  Brendia Sacks, MD  [facility] Pager [phone] 8AM-8PM  If 8PM-8AM, please contact floor/night-coverage [email or web address] Password [email or web address] 07/17/2011, 4:21 PM  LOS: 3 days   Brief narrative: 78 year old woman presented to the emergency department secondary to confusion and lethargy for 2 days since beginning metolazone.  Chart review:  07/13/2011 telephone call: Weakness of arms and legs. Concern of family for side effect of metolazone. Distention of the abdomen and edema of the feet was reported.  07/02/2011 outpatient office visit: Followup for insulin requiring diabetes mellitus. Several week history of swelling of the legs is noted. Assessment/plan: Persistent edema. Renal insufficiency.  Past medical history: Dementia, depression, insulin requiring diabetes mellitus, glaucoma, hypothyroidism, hyperlipidemia, hypertension, chronic pain syndrome  Antibiotics:  April 20-Rocephin  Interim History: Interval documentation reviewed.   Subjective: Overall feels okay.  Objective: Filed Vitals:   07/17/11 0849 07/17/11 1004 07/17/11 1230 07/17/11 1416  BP:  206/78 180/84 182/80  Pulse: 109 113 116 111  Temp:  98.7 F (37.1 C)  97.7 F (36.5 C)  TempSrc:  Oral  Oral  Resp:  20  20  Height:      Weight:      SpO2: 96% 98%  97%    Intake/Output Summary (Last 24 hours) at 07/17/11 1621 Last data filed at 07/17/11 1300  Gross per 24 hour  Intake     50 ml  Output   1204 ml  Net  -1154 ml    Exam:   General:  Appears calm and comfortable. Alert and conversant. Follows commands.  Cardiovascular: Regular rate and rhythm. No murmur, rub, gallop.   Respiratory: Clear to auscultation bilaterally. No wheezes, rales, rhonchi. Normal respiratory effort.  Abdomen: Soft, nontender,  nondistended.  Musculoskeletal: Grossly normal tone and strength in the upper and lower extremities bilaterally. Moves extremities to command.   Psychiatric: Appears to be  grossly normal in mood and affect.  Data Reviewed: Basic Metabolic Panel:  Lab 07/17/11 1610 07/16/11 0516 07/15/11 0700 07/14/11 0247  NA 149* 145 143 142  K 4.0 3.9 -- --  CL 112 106 101 102  CO2 27 24 30  --  GLUCOSE 149* 214* 151* 143*  BUN 45* 60* 66* 70*  CREATININE 1.30* 1.81* 2.48* 3.50*  CALCIUM 9.3 9.1 9.0 --  MG -- -- -- --  PHOS -- -- -- --   Liver Function Tests:  Lab 07/15/11 0700 07/14/11 0232  AST 34 43*  ALT 17 17  ALKPHOS 100 107  BILITOT 0.6 0.5  PROT 6.9 7.0  ALBUMIN 3.5 3.6    Lab 07/17/11 0505 07/16/11 0516 07/15/11 0700 07/14/11 0232  AMMONIA 58 139* 96* 168*   CBC:  Lab 07/15/11 0700 07/14/11 1643 07/14/11 0830 07/14/11 0247  WBC 7.9 7.2 6.4 --  NEUTROABS -- -- 3.7 --  HGB 11.1* 10.4* 10.5* 11.9*  HCT 34.9* 33.2* 33.1* 35.0*  MCV 91.6 93.3 94.3 --  PLT 180 179 181 --   CBG:  Lab 07/17/11 1203 07/17/11 0825 07/17/11 0414 07/16/11 2351 07/16/11 2045  GLUCAP 202* 138* 149* 190* 220*   Studies: Ct Head Wo Contrast  07/14/2011  *RADIOLOGY REPORT*  Clinical Data: Confusion and generalized weakness.  CT HEAD WITHOUT CONTRAST  Technique:  Contiguous axial images were obtained from the base of the skull through the vertex without contrast.  Comparison: CT and MRI 12/12/2010  Findings: Mild diffuse atrophy.  No mass effect or midline shift. No abnormal extra-axial fluid collections.  Gray-white matter junctions are distinct.  Basal cisterns are not effaced.  No evidence of acute intracranial hemorrhage.  Visualized paranasal sinuses are not opacified.  No depressed skull fractures. Vascular calcifications.  No significant change since previous study.  IMPRESSION: No acute intracranial abnormality.  Original Report Authenticated By: Marlon Pel, M.D.   Dg Chest Port 1  View  07/14/2011  *RADIOLOGY REPORT*  Clinical Data: Weakness.  Hypertension and diabetes.  PORTABLE CHEST - 1 VIEW  Comparison: 01/15/2011  Findings: Shallow inspiration.  Cardiac enlargement with some prominence of the pulmonary vascularity and perihilar infiltration suggesting edema.  No focal airspace consolidation.  No blunting of costophrenic angles.  No pneumothorax.  IMPRESSION: Cardiac enlargement with pulmonary vascular congestion and mild perihilar edema.  Original Report Authenticated By: Marlon Pel, M.D.   Scheduled Meds:    . cefTRIAXone (ROCEPHIN)  IV  1 g Intravenous Q24H  . donepezil  10 mg Oral Daily  . enoxaparin  40 mg Subcutaneous Q24H  . insulin aspart  0-9 Units Subcutaneous Q4H  . lactulose  30 g Oral TID  . latanoprost  1 drop Both Eyes QHS  . levothyroxine  175 mcg Oral Daily  . memantine  10 mg Oral BID  . rifaximin  550 mg Oral BID  . sertraline  100 mg Oral BID  . DISCONTD: enoxaparin  30 mg Subcutaneous Q24H   Continuous Infusions:    . sodium chloride 50 mL/hr at 07/16/11 1359

## 2011-07-17 NOTE — Progress Notes (Signed)
PT Cancellation Note   07/17/11 1556  PT Visit Information  Last PT attempted On 07/17/11  Reason Eval/Treat Not Completed Other (comment) (Pt didn't feel up to it.)    Will see again in AM.  [facility] [phone]

## 2011-07-18 LAB — GLUCOSE, CAPILLARY
Glucose-Capillary: 144 mg/dL — ABNORMAL HIGH (ref 70–99)
Glucose-Capillary: 217 mg/dL — ABNORMAL HIGH (ref 70–99)

## 2011-07-18 LAB — BASIC METABOLIC PANEL
Calcium: 9.5 mg/dL (ref 8.4–10.5)
GFR calc Af Amer: 53 mL/min — ABNORMAL LOW (ref >90)
GFR calc non Af Amer: 46 mL/min — ABNORMAL LOW (ref >90)
Glucose, Bld: 164 mg/dL — ABNORMAL HIGH (ref 70–99)
Sodium: 150 mEq/L — ABNORMAL HIGH (ref 135–145)

## 2011-07-18 MED ORDER — GABAPENTIN 300 MG PO CAPS
300.0000 mg | ORAL_CAPSULE | Freq: Three times a day (TID) | ORAL | Status: DC
  Administered 2011-07-18 – 2011-07-21 (×10): 300 mg via ORAL
  Filled 2011-07-18 (×12): qty 1

## 2011-07-18 MED ORDER — OXYCODONE HCL 5 MG PO TABS
5.0000 mg | ORAL_TABLET | Freq: Three times a day (TID) | ORAL | Status: DC
  Administered 2011-07-18 – 2011-07-21 (×10): 5 mg via ORAL
  Filled 2011-07-18 (×10): qty 1

## 2011-07-18 MED ORDER — OXYCODONE HCL 5 MG PO TABS: 5.0000 mg | ORAL_TABLET | Freq: Three times a day (TID) | ORAL | Status: DC

## 2011-07-18 NOTE — Evaluation (Signed)
Clinical/Bedside Swallow Evaluation Patient Details  Name: Shannon Charles MRN: [id number] DOB: 1927-10-06 Today's Date: 07/18/2011  HPI: 78 yr old admitted with increased confusion, lethargy.  Found to have toxic metabolic encephalopathy, acute renal failure and UTI.  CT negative, CXR pulmonary vascular congestion.  Husband states she eats regular texture food at home and denies difficulty swallowing food or liquid.  Past Medical History:  Past Medical History  Diagnosis Date  . DEMENTIA 08/23/2006  . DEPRESSION 08/23/2006  . DIABETES MELLITUS, TYPE I 08/23/2006  . HYPERLIPIDEMIA 08/23/2006  . HYPERTENSION 08/23/2006  . HYPOTHYROIDISM 08/23/2006  . OSTEOARTHRITIS 08/23/2006  . RENAL INSUFFICIENCY 08/23/2006  . Personal history of colonic polyps 04/29/2007  . Lung disease, restrictive     Mild, by pulmonary function test in October 2006 with mildly reduce DLCO  . Pain in limb   . Chronic pain syndrome   . Lack of coordination   . Lumbago   . Primary localized osteoarthrosis, lower leg   . Disorders of bursae and tendons in shoulder region, unspecified    Past Surgical History:  Past Surgical History  Procedure Date  . Total abdominal hysterectomy   . Colonoscopy w/ polypectomy     Assessment/Recommendations/Treatment Plan   SLP Assessment Clinical Impression Statement: Pt. exhbiting moderate oral dysphagia with weak lingual manipulatin, mastication and propulsion with lingual residue with graham cracker.  Indications of poor airway protection with one of eight straw sips thin liquid (SLP had already reclined chair possibly contributing to cough).  Increased shortness of breath present with cues to take small sips and rest breaks for adequate respiration.  SLP feel pt. can tolerate  upgrade to Dys 2 consistency (from puree) and thin liquid with small straw sips, pills whole in applesauce and rest breaks.   Will continue to follow for safety and efficiency with diet.   Risk for Aspiration:  Moderate  Swallow Evaluation Recommendations Diet Recommendations: Dysphagia 2 (Fine chop);Thin liquid Liquid Administration via: Cup;Straw Medication Administration: Whole meds with puree Supervision: Full supervision/cueing for compensatory strategies Compensations: Slow rate;Small sips/bites (rest breaks) Postural Changes and/or Swallow Maneuvers: Seated upright 90 degrees;Upright 30-60 min after meal Oral Care Recommendations: Oral care BID Follow up Recommendations:  (TBD)  Treatment Plan Treatment Plan Recommendations: Therapy as outlined in treatment plan below Speech Therapy Frequency: min 2x/week Treatment Duration: 2 weeks Interventions: Aspiration precaution training;Compensatory techniques;Patient/family education;Diet toleration management by SLP;Trials of upgraded texture/liquids  Prognosis Prognosis for Safe Diet Advancement: Fair Barriers to Reach Goals: Behavior;Cognitive deficits  Individuals Consulted Consulted and Agree with Results and Recommendations: Patient;Family member/caregiver  Swallowing Goals  SLP Swallowing Goals Patient will consume recommended diet without observed clinical signs of aspiration with: Moderate cueing Patient will utilize recommended strategies during swallow to increase swallowing safety with: Moderate cueing  Swallow Study Prior Functional Status  Type of Home: Apartment Lives With: Spouse Available Help at Discharge: Family;Available 24 hours/day Vocation: Retired  Radio producer  HPI: 78 yr old admitted with increased confusion, lethargy.  Found to have toxic metabolic encephalopathy, acute renal failure and UTI.  CT negative, CXR pulmonary vascular congestion.  Husband states she eats regular texture food at home and denies difficulty swallowing food or liquid. Type of Study: Bedside swallow evaluation Diet Prior to this Study: Regular;Thin liquids Respiratory Status: Room air Behavior/Cognition: Alert;Cooperative (flat, almost  whimpering wh/ he states is normal) Oral Cavity - Dentition: Dentures, top;Dentures, bottom Patient Positioning: Upright in chair Baseline Vocal Quality: Clear;Low vocal intensity (shaky quality) Volitional Cough: Weak Volitional Swallow:  Able to elicit  Oral Motor/Sensory Function  Overall Oral Motor/Sensory Function: Impaired Labial ROM:  (all labial/lingual movements with general weakness)  Consistency Results  Ice Chips Ice chips: Not tested  Thin Liquid Thin Liquid: Impaired Presentation: Cup;Straw Oral Phase Impairments: Reduced labial seal Pharyngeal  Phase Impairments: Cough - Immediate (immediate cough x 1 of 8 straw sips)  Nectar Thick Liquid Nectar Thick Liquid: Not tested  Honey Thick Liquid Honey Thick Liquid: Not tested  Puree Puree: Impaired Presentation: Spoon Oral Phase Impairments: Reduced lingual movement/coordination  Solid Solid: Impaired Presentation: Self Fed Oral Phase Impairments: Reduced lingual movement/coordination;Impaired anterior to posterior transit Oral Phase Functional Implications: Oral residue  Shannon Charles M.Ed CCC-SLP Pager [phone]  07/18/2011  Shannon Charles 07/18/2011,3:28 PM

## 2011-07-18 NOTE — Progress Notes (Signed)
PATIENT DETAILS Name: Shannon Charles Age: 78 y.o. Sex: female Date of Birth: October 18, 1927 Admit Date: 07/14/2011 ZOX:[id number], Gregary Signs, MD, MD  Subjective: Pain all over-joints hurt!! Is awake and alert.  Objective: Vital signs in last 24 hours: Filed Vitals:   07/18/11 0100 07/18/11 0500 07/18/11 1000 07/18/11 1315  BP: 186/77 192/74 168/80 137/70  Pulse: 96 104 106 89  Temp: 98.8 F (37.1 C) 98.2 F (36.8 C) 99.3 F (37.4 C) 98.2 F (36.8 C)  TempSrc: Oral Oral Oral Oral  Resp: 20 18 18 20   Height:      Weight:      SpO2: 93% 92% 95% 93%    Weight change:   Body mass index is 38.39 kg/(m^2).  Intake/Output from previous day:  Intake/Output Summary (Last 24 hours) at 07/18/11 1515 Last data filed at 07/18/11 1100  Gross per 24 hour  Intake    840 ml  Output   1253 ml  Net   -413 ml    PHYSICAL EXAM: Gen Exam: Awake and alert with clear speech.   Neck: Supple, No JVD.   Chest: B/L Clear.   CVS: S1 S2 Regular, no murmurs.  Abdomen: soft, BS +, non tender, non distended.  Extremities: no edema, lower extremities warm to touch. Neurologic: Non Focal.   Skin: No Rash.   Wounds: N/A.    CONSULTS:  None  LAB RESULTS: CBC  Lab 07/15/11 0700 07/14/11 1643 07/14/11 0830 07/14/11 0247  WBC 7.9 7.2 6.4 --  HGB 11.1* 10.4* 10.5* 11.9*  HCT 34.9* 33.2* 33.1* 35.0*  PLT 180 179 181 --  MCV 91.6 93.3 94.3 --  MCH 29.1 29.2 29.9 --  MCHC 31.8 31.3 31.7 --  RDW 15.2 15.2 15.4 --  LYMPHSABS -- -- 1.6 --  MONOABS -- -- 0.8 --  EOSABS -- -- 0.3 --  BASOSABS -- -- 0.0 --  BANDABS -- -- -- --    Chemistries   Lab 07/18/11 0545 07/17/11 0525 07/16/11 0516 07/15/11 0700 07/14/11 0247  NA 150* 149* 145 143 142  K 3.7 4.0 3.9 3.3* 3.2*  CL 113* 112 106 101 102  CO2 24 27 24 30  --  GLUCOSE 164* 149* 214* 151* 143*  BUN 41* 45* 60* 66* 70*  CREATININE 1.10 1.30* 1.81* 2.48* 3.50*  CALCIUM 9.5 9.3 9.1 9.0 --  MG -- -- -- -- --    GFR Estimated Creatinine  Clearance: 43.1 ml/min (by C-G formula based on Cr of 1.1).  Coagulation profile No results found for this basename: INR:5,PROTIME:5 in the last 168 hours  Cardiac Enzymes No results found for this basename: CK:3,CKMB:3,TROPONINI:3,MYOGLOBIN:3 in the last 168 hours  No components found with this basename: POCBNP:3 No results found for this basename: DDIMER:2 in the last 72 hours No results found for this basename: HGBA1C:2 in the last 72 hours No results found for this basename: CHOL:2,HDL:2,LDLCALC:2,TRIG:2,CHOLHDL:2,LDLDIRECT:2 in the last 72 hours No results found for this basename: TSH,T4TOTAL,FREET3,T3FREE,THYROIDAB in the last 72 hours No results found for this basename: VITAMINB12:2,FOLATE:2,FERRITIN:2,TIBC:2,IRON:2,RETICCTPCT:2 in the last 72 hours No results found for this basename: LIPASE:2,AMYLASE:2 in the last 72 hours  Urine Studies No results found for this basename: UACOL:2,UAPR:2,USPG:2,UPH:2,UTP:2,UGL:2,UKET:2,UBIL:2,UHGB:2,UNIT:2,UROB:2,ULEU:2,UEPI:2,UWBC:2,URBC:2,UBAC:2,CAST:2,CRYS:2,UCOM:2,BILUA:2 in the last 72 hours  MICROBIOLOGY: Recent Results (from the past 240 hour(s))  URINE CULTURE     Status: Normal   Collection Time   07/14/11  2:53 AM      Component Value Range Status Comment   Specimen Description URINE, CATHETERIZED   Final  Special Requests NONE   Final    Culture  Setup Time [id number]   Final    Colony Count >=100,000 COLONIES/ML   Final    Culture KLEBSIELLA PNEUMONIAE   Final    Report Status 07/16/2011 FINAL   Final    Organism ID, Bacteria KLEBSIELLA PNEUMONIAE   Final     RADIOLOGY STUDIES/RESULTS: Ct Head Wo Contrast  07/14/2011  *RADIOLOGY REPORT*  Clinical Data: Confusion and generalized weakness.  CT HEAD WITHOUT CONTRAST  Technique:  Contiguous axial images were obtained from the base of the skull through the vertex without contrast.  Comparison: CT and MRI 12/12/2010  Findings: Mild diffuse atrophy.  No mass effect or midline  shift. No abnormal extra-axial fluid collections.  Gray-white matter junctions are distinct.  Basal cisterns are not effaced.  No evidence of acute intracranial hemorrhage.  Visualized paranasal sinuses are not opacified.  No depressed skull fractures. Vascular calcifications.  No significant change since previous study.  IMPRESSION: No acute intracranial abnormality.  Original Report Authenticated By: Marlon Pel, M.D.   US Abdomen Complete  07/15/2011  *RADIOLOGY REPORT*  Clinical Data:  Hepatic encephalopathy.  Elevated liver function tests.  COMPLETE ABDOMINAL ULTRASOUND  Comparison:  Renal ultrasound of 02/08/2005.  Findings:  Gallbladder:  Small stones and probable sludge.  No wall thickening or pericholecystic fluid. Sonographic Murphy's sign was not elicited.  Common bile duct: Normal, 4 mm.  Liver: Suspicion of an irregular hepatic contour.  Example image 31.  IVC: Negative  Pancreas:  Poorly visualized due to overlying bowel gas.  Spleen:  Normal in size and echogenicity.  Right Kidney:  9.2 cm. Poorly visualized due to overlying bowel gas.  No hydronephrosis.  Left Kidney:  10.7 cm. No hydronephrosis.  Abdominal aorta:  Not visualized due to overlying bowel gas. No ascites.  IMPRESSION:  1.  Suspicion of cirrhosis.  Subtle findings of irregular hepatic contour. 2.  No other explanation for elevated liver function tests. 3.  Cholelithiasis without cholecystitis. 4. Portions of anatomy obscured by overlying bowel gas and patient body habitus.  Original Report Authenticated By: Consuello Bossier, M.D.   Dg Chest Port 1 View  07/14/2011  *RADIOLOGY REPORT*  Clinical Data: Weakness.  Hypertension and diabetes.  PORTABLE CHEST - 1 VIEW  Comparison: 01/15/2011  Findings: Shallow inspiration.  Cardiac enlargement with some prominence of the pulmonary vascularity and perihilar infiltration suggesting edema.  No focal airspace consolidation.  No blunting of costophrenic angles.  No pneumothorax.   IMPRESSION: Cardiac enlargement with pulmonary vascular congestion and mild perihilar edema.  Original Report Authenticated By: Marlon Pel, M.D.    MEDICATIONS: Scheduled Meds:   . amLODipine  5 mg Oral Daily  . cefTRIAXone (ROCEPHIN)  IV  1 g Intravenous Q24H  . donepezil  10 mg Oral Daily  . enoxaparin  40 mg Subcutaneous Q24H  . gabapentin  300 mg Oral TID  . insulin aspart  0-9 Units Subcutaneous Q4H  . lactulose  10 g Oral Daily  . latanoprost  1 drop Both Eyes QHS  . levothyroxine  175 mcg Oral Daily  . memantine  10 mg Oral BID  . metoprolol tartrate  12.5 mg Oral BID  . oxyCODONE  5 mg Oral TID  . rifaximin  550 mg Oral BID  . sertraline  100 mg Oral BID  . DISCONTD: lactulose  30 g Oral TID  . DISCONTD: oxyCODONE  5 mg Oral TID   Continuous Infusions:  PRN Meds:.hydrALAZINE,  LORazepam, morphine injection, ondansetron (ZOFRAN) IV, ondansetron, simethicone, sodium phosphate, DISCONTD: oxyCODONE  Antibiotics: Anti-infectives     Start     Dose/Rate Route Frequency Ordered Stop   07/16/11 1600   cefTRIAXone (ROCEPHIN) 1 g in dextrose 5 % 50 mL IVPB        1 g 100 mL/hr over 30 Minutes Intravenous Every 24 hours 07/16/11 0824     07/16/11 1000   rifaximin (XIFAXAN) tablet 550 mg        550 mg Oral 2 times daily 07/16/11 0826     07/16/11 0430   ciprofloxacin (CIPRO) IVPB 400 mg  Status:  Discontinued        400 mg 200 mL/hr over 60 Minutes Intravenous Daily at bedtime 07/16/11 0344 07/16/11 0824   07/14/11 2200   cefTRIAXone (ROCEPHIN) 1 g in dextrose 5 % 50 mL IVPB        1 g 100 mL/hr over 30 Minutes Intravenous  Once 07/14/11 1046 07/14/11 2233   07/14/11 0330   cefTRIAXone (ROCEPHIN) 1 g in dextrose 5 % 50 mL IVPB        1 g 100 mL/hr over 30 Minutes Intravenous  Once 07/14/11 0324 07/14/11 0447         s Assessment/Plan: 1. Acute encephalopathy: Seems to have resolved. Superimposed on dementia. CT head negative. Likely multifactorial: Acute renal  failure, UTI, hyperammonemia. 2. Hyperammonemia/Hepatic encephalopathy: Ammonia now within normal limits. Decrease lactulose. Continue rifaximin. No history of hepatitis or hepatic disease but ultrasound suggestive of cirrhosis. She does have persistent transaminitis of unclear etiology. Acute hepatitis panel negative. Likely has NASH as the etiology.Will need outpatient gastroenterology follow up-when she is much better. 3. Acute renal failure: Resolved. History and clinical findings most suggestive of prerenal azotemia secondary to naproxen and diuretic use.  4. Hypertension: Poorly controlled. Start low-dose beta blocker and Norvasc. 5. Hypercapnia with metabolic compensation: Appears to be asymptomatic at this point. Etiology unclear. 6. Normocytic anemia: Stable. No signs of bleeding. Followup as outpatient. 7. Lower extremity edema: Not dramatic. Follow clinically. Although she has lower extremity edema she is apparently intravascularly dry. 2-D echocardiogram May 2012: Left ventricular ejection fraction 55-60%. Grade 1 diastolic dysfunction. 8. Hypokalemia: Repleted. 9. Urinary tract infection: Continue Rocephin.  10. Persistent transaminitis: Present since at least June 2009.  11. Hypothyroidism: TSH low. Decreased Synthroid dose. 12. Diabetes mellitus, insulin requiring, uncontrolled: Hemoglobin A1c 9.4. Stable as an inpatient. Sliding scale insulin. Resume Levemir when needed. 13. Depression: Continue Zoloft. 14. Dementia: Continue Aricept, Namenda 15. Hypernatremia:I will encourage PO intake for now, and recheck BMET in am, if need be I can start some IVF 16. ? Dysphagia: await SLP eval 17. Chronic Pain Syndrome: see's Pain MD as outpatient, apparently on scheduled Percocet as outpatient. Will start on scheduled Oxycodone  Disposition: CIR vs SNF  DVT Prophylaxis: Prophylactic Lovenox  Code Status: Full Code  Maretta Bees,  MD. 07/18/2011, 3:15 PM

## 2011-07-18 NOTE — Evaluation (Signed)
Physical Therapy Evaluation Patient Details Name: Shannon Charles MRN: [id number] DOB: Aug 02, 1927 Today's Date: 07/18/2011 Time: 7829-5621 PT Time Calculation (min): 60 min  PT Assessment / Plan / Recommendation Clinical Impression  78 y.o. female admitted to [facility] for AMS and lethargy dx with toxic metabolic encephalopathy, UTI, ARF.  She presents today very confused, painful with swollen arms and legs, decreased strength, decreased balance, decreased mobility and activity tolerance.  She would benefit from acute PT to maximize her independence, functional mobility and safety so that after extensive rehabilitation she may be able to return home with her husband's assistance.      PT Assessment  Patient needs continued PT services    Follow Up Recommendations  Inpatient Rehab    Equipment Recommendations  Defer to next venue    Frequency Min 3X/week    Precautions / Restrictions Precautions Precautions: Fall Restrictions Weight Bearing Restrictions: No   Pertinent Vitals/Pain Pain 6/10 "everywhere" "all my joints" per patient, no vitals taken, 2 LO2 [place] left on secondary to increased DOE 2/4 at rest and during mobility.  Husband reports she doesn't usually breathe like this at home and does not use O2 at home.        Mobility  Bed Mobility Rolling Right: 3: Mod assist;With rail Rolling Left: 3: Mod assist;With rail Supine to Sit: 1: +1 Total assist;HOB elevated Sitting - Scoot to Edge of Bed: 2: Max assist Sit to Supine: 1: +1 Total assist;HOB flat Scooting to HOB: 1: +1 Total assist;Other (comment) (bed in maximum trendelenberg) Details for Bed Mobility Assistance: mod assist to roll to progress hips all the way over to sidelying and to help patient reach with her hands to railings, total assist to sit up as patinet needed help with both legs (mild muscle initiation of bil legs, but unable to move them to the EOB) and assist needed at trunk (the patient was not really able to pull  much with upper extremities secondary to patin and swelling in her joints.   Goiong back to supine secondary to reports that she had to have a BM and this therapist did not feel safe getting her to the [facility] she needed total assist to control trunk and lift both legs back into the bed.  Scooting to the [facility] the patient did not help at all (0%), she was <30% for getting to sitting.    Transfers Transfer via Lift Equipment: Maximove Details for Transfer Assistance: used the maximove to get from the bed to the recliner chair.  She was not safe to attempt standing without 2 person assist with her mobility to the side of the bed indicating very poor leg strength.      Exercises General Exercises - Upper Extremity Shoulder Flexion: AAROM;Both;10 reps;Seated Elbow Flexion: AAROM;Both;10 reps;Seated General Exercises - Lower Extremity Ankle Circles/Pumps: AAROM;Both;10 reps;Seated Long Arc Quad: AROM;Both;10 reps   PT Goals Acute Rehab PT Goals PT Goal Formulation: With family Time For Goal Achievement: 08/01/11 Potential to Achieve Goals: Good Pt will Roll Supine to Right Side: with modified independence;with rail PT Goal: Rolling Supine to Right Side - Progress: Goal set today Pt will Roll Supine to Left Side: with modified independence;with rail PT Goal: Rolling Supine to Left Side - Progress: Goal set today Pt will go Supine/Side to Sit: with mod assist PT Goal: Supine/Side to Sit - Progress: Goal set today Pt will Sit at Edge of Bed: with supervision;with bilateral upper extremity support;3-5 min PT Goal: Sit at [facility] Of Bed -  Progress: Goal set today Pt will go Sit to Supine/Side: with mod assist PT Goal: Sit to Supine/Side - Progress: Goal set today Pt will go Sit to Stand: with +2 total assist;with upper extremity assist (patient >60%) PT Goal: Sit to Stand - Progress: Goal set today Pt will go Stand to Sit: with mod assist PT Goal: Stand to Sit - Progress: Goal set today Pt will Transfer  Bed to Chair/Chair to Bed: with +2 total assist (Patient >60%) PT Transfer Goal: Bed to Chair/Chair to Bed - Progress: Goal set today Pt will Ambulate: 16 - 50 feet;with +2 total assist;with rolling walker (patient 70%) PT Goal: Ambulate - Progress: Goal set today  Visit Information  Last PT Received On: 07/18/11 Assistance Needed: +2    Subjective Data  Subjective: Husband states that she was indpendent prior to starting a new medication ~ 1 week ago.   Patient Stated Goal: to get strong enough to go home.     Prior Functioning  Home Living Lives With: Spouse Available Help at Discharge: Family;Available 24 hours/day Type of Home: Apartment Home Access: Level entry Home Layout: One level Bathroom Shower/Tub: Tub/shower unit;Curtain Firefighter: Standard Bathroom Accessibility: Yes How Accessible: Accessible via walker (sideways) Home Adaptive Equipment: Bedside commode/3-in-1;Walker - four wheeled;Straight cane;Quad cane;Hand-held shower hose Prior Function Level of Independence: Independent Able to Take Stairs?: No Driving: No Vocation: Retired Comments: family takes patient and her husband to grocery and appt's Communication Communication:  (the patient did not say much during the treatment session) Dominant Hand: Right    Cognition  Overall Cognitive Status: History of cognitive impairments - at baseline (dementia) Arousal/Alertness: Awake/alert Behavior During Session: Anxious (re mobility, pain and falling)    Extremity/Trunk Assessment Right Lower Extremity Assessment RLE ROM/Strength/Tone: Deficits RLE ROM/Strength/Tone Deficits: Grossly 2+/5 ankles, knees, hips  Left Lower Extremity Assessment LLE ROM/Strength/Tone: Deficits LLE ROM/Strength/Tone Deficits: grossly 2+/5 ankles, knees, hips   Balance Static Sitting Balance Static Sitting - Balance Support: Bilateral upper extremity supported;Feet supported Static Sitting - Level of Assistance: 4: Min  assist Static Sitting - Comment/# of Minutes: 5-8 mins EOB working on sitting balance, patient unable to support herself without posterior LOB for greater than 30 seconds.    End of Session PT - End of Session Equipment Utilized During Treatment:  (maxi move) Activity Tolerance: Patient limited by fatigue;Patient limited by pain Patient left: in chair;with call bell/phone within reach;with family/visitor present (husband in room) Nurse Communication: Need for lift equipment (d/c recommendations)   Lurena Joiner B. Turner Baillie, PT, DPT [phone] 07/18/2011, 1:59 PM

## 2011-07-18 NOTE — Consult Note (Signed)
Physical Medicine and Rehabilitation Consult Reason for Consult: Acute encephalopathy Referring Phsyician: Shannon Charles is an 78 y.o. female.   HPI:  78 year old right-handed female multi-medical history of dementia, diabetes mellitus and renal insufficiency with creatinine 3.50 as well as chronic pain syndrome. Prior to admission patient was able to ambulate in the home with the use of a cane walker and assistance of her husband. Patient with recent change in medications per husband and recently started on metolozone. Admitted April 20 with altered mental status worsening confusion and lethargy over a 48-hour period. Cranial CT scan showed no acute intracranial abnormality. Noted ammonia levels of 168 placed on lactulose. She hemoglobin A1c of 9.1. Urine culture positive for Klebsiella. Patient placed on Rocephin as well as gentle IV fluids. She remains on Aricept as well as Namenda for history of dementia. Subcutaneous Lovenox added for deep vein thrombosis prophylaxis. Physical therapy evaluation completed April 24 with recommendations for physical medicine rehabilitation consult consider inpatient rehabilitation services Review of Systems  Cardiovascular: Positive for leg swelling.  Musculoskeletal: Positive for joint pain.  Psychiatric/Behavioral: Positive for depression.       Dementia and memory loss  All other systems reviewed and are negative.   Past Medical History  Diagnosis Date  . DEMENTIA 08/23/2006  . DEPRESSION 08/23/2006  . DIABETES MELLITUS, TYPE I 08/23/2006  . HYPERLIPIDEMIA 08/23/2006  . HYPERTENSION 08/23/2006  . HYPOTHYROIDISM 08/23/2006  . OSTEOARTHRITIS 08/23/2006  . RENAL INSUFFICIENCY 08/23/2006  . Personal history of colonic polyps 04/29/2007  . Lung disease, restrictive     Mild, by pulmonary function test in October 2006 with mildly reduce DLCO  . Pain in limb   . Chronic pain syndrome   . Lack of coordination   . Lumbago   . Primary localized  osteoarthrosis, lower leg   . Disorders of bursae and tendons in shoulder region, unspecified    Past Surgical History  Procedure Date  . Total abdominal hysterectomy   . Colonoscopy w/ polypectomy    Family History  Problem Relation Age of Onset  . Cancer Sister     uncertain type  . Cirrhosis Mother    Social History:  reports that she has never smoked. She does not have any smokeless tobacco history on file. She reports that she does not drink alcohol or use illicit drugs. Allergies: No Known Allergies Medications Prior to Admission  Medication Sig Dispense Refill  . amitriptyline (ELAVIL) 10 MG tablet 10 mg at bedtime.       Marland Kitchen aspirin 81 MG tablet Take 81 mg by mouth daily.      Marland Kitchen donepezil (ARICEPT) 10 MG tablet Take 10 mg by mouth daily.        . dorzolamide-timolol (COSOPT) 22.3-6.8 MG/ML ophthalmic solution Place 1 drop into both eyes daily.       . furosemide (LASIX) 80 MG tablet Take 80 mg by mouth daily.      Marland Kitchen gabapentin (NEURONTIN) 300 MG capsule Take 300 mg by mouth 3 (three) times daily.      . insulin detemir (LEVEMIR) 100 UNIT/ML injection Inject 80 Units into the skin daily.      . Insulin Pen Needle 32G X 4 MM MISC 1 each by Other route as needed. Use as directed twice daily      . latanoprost (XALATAN) 0.005 % ophthalmic solution Place 1 drop into both eyes at bedtime.      Marland Kitchen levothyroxine (SYNTHROID, LEVOTHROID) 200 MCG tablet Take 200 mcg by  mouth daily.      Marland Kitchen lisdexamfetamine (VYVANSE) 50 MG capsule Take 50 mg by mouth every morning.      Marland Kitchen LORazepam (ATIVAN) 0.5 MG tablet Take 0.5 mg by mouth 2 (two) times daily as needed. For anxiety      . losartan (COZAAR) 100 MG tablet Take 100 mg by mouth daily.      . memantine (NAMENDA) 10 MG tablet Take 10 mg by mouth 2 (two) times daily.      . metolazone (ZAROXOLYN) 2.5 MG tablet Take 2.5 mg by mouth daily.      . Multiple Vitamin (MULITIVITAMIN WITH MINERALS) TABS Take 1 tablet by mouth daily.      . naproxen  sodium (ANAPROX) 220 MG tablet Take 220 mg by mouth at bedtime.      Marland Kitchen oxyCODONE-acetaminophen (PERCOCET) 5-325 MG per tablet Take 0.5-1 tablets by mouth 4 (four) times daily. Take 1/2 tab qam, 1/2 tab at 1pm and 4pm, 1 whole tablet at HS      . sertraline (ZOLOFT) 50 MG tablet Take 100 mg by mouth 2 (two) times daily.      . simvastatin (ZOCOR) 40 MG tablet Take 40 mg by mouth every evening.        Home: Home Living Lives With: Spouse Available Help at Discharge: Family;Available 24 hours/day Type of Home: Apartment Home Access: Level entry Home Layout: One level Bathroom Shower/Tub: Tub/shower unit;Curtain Firefighter: Standard Bathroom Accessibility: Yes How Accessible: Accessible via walker (sideways) Home Adaptive Equipment: Bedside commode/3-in-1;Walker - four wheeled;Straight cane;Quad cane;Hand-held shower hose  Functional History: Prior Function Able to Take Stairs?: No Driving: No Vocation: Retired Comments: family takes patient and her husband to grocery and appt's Functional Status:  Mobility: Bed Mobility Bed Mobility: Rolling Right;Right Sidelying to Sit;Sitting - Scoot to [place] of Bed Rolling Right: 3: Mod assist;With rail Rolling Left: 3: Mod assist;With rail Right Sidelying to Sit: 3: Mod assist;HOB elevated (HOB at 50degrees) Supine to Sit: 1: +1 Total assist;HOB elevated Sitting - Scoot to Edge of Bed: 2: Max assist Sit to Supine: 1: +1 Total assist;HOB flat Scooting to HOB: 1: +1 Total assist;Other (comment) (bed in maximum trendelenberg) Transfers Transfer via Lift Equipment: Maximove      ADL: ADL Eating/Feeding: Not assessed Grooming: Performed;Minimal assistance Where Assessed - Grooming: Supported sitting Upper Body Bathing: Simulated;Maximal assistance Where Assessed - Upper Body Bathing: Sitting, bed Lower Body Bathing: Simulated;+1 Total assistance Where Assessed - Lower Body Bathing: Sit to stand from bed Upper Body Dressing:  Simulated;Moderate assistance Where Assessed - Upper Body Dressing: Sitting, bed Lower Body Dressing: +1 Total assistance;Performed (don socks) Where Assessed - Lower Body Dressing: Sitting, bed Toilet Transfer: Performed;Maximal assistance (pt very anxious) Toilet Transfer Method: Stand pivot (simulated EOB to chair) Toileting - Clothing Manipulation: Not assessed Toileting - Hygiene: Performed;+1 Total assistance Where Assessed - Toileting Hygiene: Rolling right and/or left ((pt on bedpan upon therapist entry)) Tub/Shower Transfer: Not assessed ADL Comments: Pt limited by anxiety  Cognition: Cognition Arousal/Alertness: Awake/alert Orientation Level: Oriented to person;Oriented to place Cognition Overall Cognitive Status: History of cognitive impairments - at baseline (dementia) Arousal/Alertness: Awake/alert Behavior During Session: Anxious (re mobility, pain and falling)  Blood pressure 137/70, pulse 89, temperature 98.2 F (36.8 C), temperature source Oral, resp. rate 20, height 5\' 2"  (1.575 m), weight 95.2 kg (209 lb 14.1 oz), SpO2 93.00%. Physical Exam  Vitals reviewed. Constitutional: She appears well-developed.       obese  HENT:  Head: Normocephalic.  Eyes: Conjunctivae and EOM are normal. Pupils are equal, round, and reactive to light.  Neck: Normal range of motion. No thyromegaly present.  Cardiovascular: Regular rhythm.   Pulmonary/Chest: Effort normal and breath sounds normal. She has no wheezes.  Abdominal: Soft. She exhibits no distension. There is no tenderness.  Musculoskeletal:       +1 edema to the lower extremities  Neurological: She is alert.       She to give her name but not age or date of birth. She did say she was at [facility] and that she had to go to the bathroom. She could name her husband sitting at bedside. She needed some cues to follow commands.  She was restless. She moved all 4 ext's fairly equally (3-4/5) and sensation was grossly intact to pain.  May have had some distal sensory loss  Skin: Skin is warm and dry.  Psychiatric: She has a normal mood and affect. Judgment and thought content normal.       She had very limited awareness of her deficits    Results for orders placed during the hospital encounter of 07/14/11 (from the past 24 hour(s))  GLUCOSE, CAPILLARY     Status: Abnormal   Collection Time   07/17/11  5:13 PM      Component Value Range   Glucose-Capillary 170 (*) 70 - 99 (mg/dL)  GLUCOSE, CAPILLARY     Status: Abnormal   Collection Time   07/17/11  9:32 PM      Component Value Range   Glucose-Capillary 156 (*) 70 - 99 (mg/dL)   Comment 1 Documented in Chart     Comment 2 Notify RN    GLUCOSE, CAPILLARY     Status: Abnormal   Collection Time   07/18/11 12:38 AM      Component Value Range   Glucose-Capillary 154 (*) 70 - 99 (mg/dL)  GLUCOSE, CAPILLARY     Status: Abnormal   Collection Time   07/18/11  4:13 AM      Component Value Range   Glucose-Capillary 144 (*) 70 - 99 (mg/dL)  BASIC METABOLIC PANEL     Status: Abnormal   Collection Time   07/18/11  5:45 AM      Component Value Range   Sodium 150 (*) 135 - 145 (mEq/L)   Potassium 3.7  3.5 - 5.1 (mEq/L)   Chloride 113 (*) 96 - 112 (mEq/L)   CO2 24  19 - 32 (mEq/L)   Glucose, Bld 164 (*) 70 - 99 (mg/dL)   BUN 41 (*) 6 - 23 (mg/dL)   Creatinine, Ser 4.54  0.50 - 1.10 (mg/dL)   Calcium 9.5  8.4 - 09.8 (mg/dL)   GFR calc non Af Amer 46 (*) >90 (mL/min)   GFR calc Af Amer 53 (*) >90 (mL/min)  GLUCOSE, CAPILLARY     Status: Abnormal   Collection Time   07/18/11  8:12 AM      Component Value Range   Glucose-Capillary 151 (*) 70 - 99 (mg/dL)  GLUCOSE, CAPILLARY     Status: Abnormal   Collection Time   07/18/11 12:01 PM      Component Value Range   Glucose-Capillary 179 (*) 70 - 99 (mg/dL)   No results found.  Assessment/Plan: Diagnosis: premorbid dementia with acute encephalopathy 1. Does the need for close, 24 hr/day medical supervision in concert with  the patient's rehab needs make it unreasonable for this patient to be served in a less intensive setting? Potentially 2.  Co-Morbidities requiring supervision/potential complications: dm, hypothyroid, htn 3. Due to bladder management, bowel management, safety, skin/wound care, disease management, medication administration, pain management and patient education, does the patient require 24 hr/day rehab nursing? Yes 4. Does the patient require coordinated care of a physician, rehab nurse, PT (1-2 hrs/day, 5 days/week), OT (1-2 hrs/day, 5 days/week) and SLP (1-2 hrs/day, 5 days/week) to address physical and functional deficits in the context of the above medical diagnosis(es)? Potentially Addressing deficits in the following areas: balance, endurance, locomotion, strength, transferring, bowel/bladder control, bathing, dressing, feeding, grooming, toileting, cognition, speech, language and psychosocial support 5. Can the patient actively participate in an intensive therapy program of at least 3 hrs of therapy per day at least 5 days per week? Potentially 6. The potential for patient to make measurable gains while on inpatient rehab is good and fair 7. Anticipated functional outcomes upon discharge from inpatient rehab are min assist with PT, min to mod assist with OT, min assist with SLP. 8. Estimated rehab length of stay to reach the above functional goals is: ?2 weeks 9. Does the patient have adequate social supports to accommodate these discharge functional goals? Yes and Potentially 10. Anticipated D/C setting: Home 11. Anticipated post D/C treatments: HH therapy 12. Overall Rehab/Functional Prognosis: fair  RECOMMENDATIONS: This patient's condition is appropriate for continued rehabilitative care in the following setting: SNF vs CIR Patient has agreed to participate in recommended program. Potentially Note that insurance prior authorization may be required for reimbursement for recommended  care.  Comment: I spoke with the patient and husband at length. Husband states that prior to this decline a couple weeks ago that she was sedentary but fairly functional at a household level. (She could bathe, toilet, dress, etc, but needed help for balance). I would like to see how she physically tolerates therapy on acute a bit more.  Even her husband seemed a bit hesitant when i mentioned the 3 hours of therapy per day on inpatient rehab. If she displays reasonable tolerance and carry over, we could give CIR a try.  Ivory Broad, MD    07/18/2011

## 2011-07-19 DIAGNOSIS — G929 Unspecified toxic encephalopathy: Secondary | ICD-10-CM

## 2011-07-19 DIAGNOSIS — G92 Toxic encephalopathy: Secondary | ICD-10-CM

## 2011-07-19 DIAGNOSIS — F0390 Unspecified dementia without behavioral disturbance: Secondary | ICD-10-CM

## 2011-07-19 LAB — GLUCOSE, CAPILLARY
Glucose-Capillary: 176 mg/dL — ABNORMAL HIGH (ref 70–99)
Glucose-Capillary: 177 mg/dL — ABNORMAL HIGH (ref 70–99)

## 2011-07-19 LAB — BASIC METABOLIC PANEL
GFR calc Af Amer: 52 mL/min — ABNORMAL LOW (ref >90)
GFR calc non Af Amer: 45 mL/min — ABNORMAL LOW (ref >90)
Glucose, Bld: 130 mg/dL — ABNORMAL HIGH (ref 70–99)
Potassium: 3.7 mEq/L (ref 3.5–5.1)
Sodium: 151 mEq/L — ABNORMAL HIGH (ref 135–145)

## 2011-07-19 MED ORDER — WHITE PETROLATUM GEL
Status: AC
  Administered 2011-07-19: 12:00:00
  Filled 2011-07-19: qty 5

## 2011-07-19 MED ORDER — GLUCERNA SHAKE PO LIQD
237.0000 mL | Freq: Two times a day (BID) | ORAL | Status: DC
  Administered 2011-07-19 – 2011-07-21 (×3): 237 mL via ORAL

## 2011-07-19 MED ORDER — INSULIN ASPART 100 UNIT/ML ~~LOC~~ SOLN
0.0000 [IU] | Freq: Three times a day (TID) | SUBCUTANEOUS | Status: DC
  Administered 2011-07-19: 1 [IU] via SUBCUTANEOUS
  Administered 2011-07-20: 3 [IU] via SUBCUTANEOUS
  Administered 2011-07-20: 1 [IU] via SUBCUTANEOUS
  Administered 2011-07-20: 2 [IU] via SUBCUTANEOUS
  Administered 2011-07-21: 1 [IU] via SUBCUTANEOUS

## 2011-07-19 MED ORDER — AMITRIPTYLINE HCL 10 MG PO TABS
10.0000 mg | ORAL_TABLET | Freq: Every day | ORAL | Status: DC
  Administered 2011-07-19 – 2011-07-20 (×2): 10 mg via ORAL
  Filled 2011-07-19 (×3): qty 1

## 2011-07-19 MED ORDER — SODIUM CHLORIDE 0.45 % IV SOLN
INTRAVENOUS | Status: AC
  Administered 2011-07-19: 11:00:00 via INTRAVENOUS

## 2011-07-19 NOTE — Progress Notes (Addendum)
Clinical Social Work Department CLINICAL SOCIAL WORK PLACEMENT NOTE 07/19/2011  Patient:  SHANTERA, MONTS  Account Number:  [id number] Admit date:  07/14/2011  Clinical Social Worker:  Doree Albee  Date/time:  07/19/2011 05:50 PM  Clinical Social Work is seeking post-discharge placement for this patient at the following level of care:   SKILLED NURSING   (*CSW will update this form in Epic as items are completed)   07/19/2011  Patient/family provided with [facility] Department of Clinical Social Work's list of facilities offering this level of care within the geographic area requested by the patient (or if unable, by the patient's family).  07/19/2011  Patient/family informed of their freedom to choose among providers that offer the needed level of care, that participate in Medicare, Medicaid or managed care program needed by the patient, have an available bed and are willing to accept the patient.  07/19/2011  Patient/family informed of [facility]' ownership interest in [facility], as well as of the fact that they are under no obligation to receive care at this facility.  PASARR submitted to EDS on 07/19/2011 PASARR number received from [employer] on   FL2 transmitted to all facilities in geographic area requested by pt/family on  07/19/2011 FL2 transmitted to all facilities within larger geographic area on   Patient informed that his/her managed care company has contracts with or will negotiate with  certain facilities, including the following:     Patient/family informed of bed offers received:  07/20/2011  Patient chooses bed at [facility]   Physician recommends and patient chooses bed at    Patient to be transferred to [facility]  on  07/21/2011  Patient to be transferred to facility by [facility]   The following physician request were entered in Epic:   Additional Comments:  .Catha Gosselin, Theresia Majors  [phone] .07/19/2011 1752pm

## 2011-07-19 NOTE — Progress Notes (Signed)
Physical Therapy Treatment Patient Details Name: Shannon Charles MRN: [id number] DOB: [birth year] Today's Date: 07/19/2011 Time: 4098-1191 PT Time Calculation (min): 16 min  PT Assessment / Plan / Recommendation Comments on Treatment Session  Pt making slow steady progress.      Follow Up Recommendations  Inpatient Rehab    Equipment Recommendations  Defer to next venue    Frequency     Plan Discharge plan remains appropriate    Precautions / Restrictions Precautions Precautions: Fall Restrictions Weight Bearing Restrictions: No   Pertinent Vitals/Pain Pain all over.    Mobility  Transfers Transfers: Sit to Stand;Stand to Sit;Stand Pivot Transfers Sit to Stand: 1: +2 Total assist;From chair/3-in-1;With upper extremity assist;With armrests Sit to Stand: Patient Percentage: 60% Stand to Sit: 1: +2 Total assist;To chair/3-in-1;With upper extremity assist;With armrests Stand to Sit: Patient Percentage: 80% Stand Pivot Transfers: 1: +2 Total assist Stand Pivot Transfers: Patient Percentage: 60% Details for Transfer Assistance: Verbal/tactile cues for hand placement.  Chair to [facility] for stand pivot. Ambulation/Gait Ambulation/Gait Assistance: 1: +2 Total assist Ambulation/Gait: Patient Percentage: 80 Ambulation Distance (Feet): 8 Feet Assistive device: Rolling walker Ambulation/Gait Assistance Details: verbal encouragement to continue amb. Gait Pattern: Wide base of support;Decreased step length - right;Decreased step length - left;Trunk flexed Gait velocity: very slow pace    Exercises     PT Goals Acute Rehab PT Goals Pt will go Sit to Stand: with min assist PT Goal: Sit to Stand - Progress: Updated due to goal met PT Goal: Stand to Sit - Progress: Progressing toward goal Pt will Transfer Bed to Chair/Chair to Bed: with min assist PT Transfer Goal: Bed to Chair/Chair to Bed - Progress: Updated due to goal met PT Goal: Ambulate - Progress: Progressing toward goal  Visit  Information  Last PT Received On: 07/19/11 Assistance Needed: +2    Subjective Data  Subjective: Pt stating she is sore all over.   Cognition  Overall Cognitive Status: History of cognitive impairments - at baseline Arousal/Alertness: Awake/alert Behavior During Session: Flat affect    Balance  Static Standing Balance Static Standing - Balance Support: Bilateral upper extremity supported (on walker) Static Standing - Level of Assistance: 4: Min assist  End of Session PT - End of Session Activity Tolerance: Patient limited by fatigue;Patient limited by pain Patient left: in chair;with call bell/phone within reach;with nursing in room Nurse Communication: Mobility status    Monya Kozakiewicz 07/19/2011, 11:30 AM  [facility] PT [phone]

## 2011-07-19 NOTE — Plan of Care (Signed)
Problem: Phase III Progression Outcomes Goal: Discharge plan remains appropriate-arrangements made Inpt. Rehab vs SNF

## 2011-07-19 NOTE — Progress Notes (Signed)
Clinical Social Work Department BRIEF PSYCHOSOCIAL ASSESSMENT 07/19/2011  Patient:  Shannon Charles, Shannon Charles     Account Number:  [id number]     Admit date:  07/14/2011  Clinical Social Worker:  Doree Albee  Date/Time:  07/19/2011 05:00 PM  Referred by:  RN  Date Referred:  07/19/2011 Referred for  SNF Placement   Other Referral:   Interview type:  Patient Other interview type:   and pt spouse    PSYCHOSOCIAL DATA Living Status:  FAMILY Admitted from facility:   Level of care:   Primary support name:  Mr. Masri Primary support relationship to patient:  SPOUSE Degree of support available:   strong    CURRENT CONCERNS Current Concerns  Post-Acute Placement   Other Concerns:    SOCIAL WORK ASSESSMENT / PLAN CSW met with pt and pt spouse at bedside to discuss patient dipsositon needs. Pt was resting peacefully and answered questions approrpriately.    CSW pt, and pt spouse discussed the recommendations for inpatient rehab and skilled nursing for rehab. CSW explained that if patient was not elligible for inaptient rehab due to endurance for inense therapy at CIR that rehab at snf was another option.    Pt and pt spouse agreed that rehab at snf would be needed if patient unable to participate in CIR. Pt  and pt spouse agreed to snf search for patient to help narrow down the otpions for a plan b based on patient needs.    Pt spouse also shared with CSW his concerns regarding a female entering the patients room at 430am, and looking for a patient by a different name. Pt spouse felt that the males behavior was inapprorpriate as he did not turn on the lights, and was feeling under patients blanket. Pt spouse stated, "I'm not accusing but it made me feel very uncomfortable." Pt agreed for CSW to speak with nursing staff. Nursing staff had addressed, and Unit supervisor was coming to speak with pt and pt spouse.    Pt agreed to speak with unit director and would discuss further with csw  in the morning if needed.  CSW provided emotional support and active listening. Pt thanked csw for listening and addressing his concerns.   Assessment/plan status:  Psychosocial Support/Ongoing Assessment of Needs Other assessment/ plan:   Information/referral to community resources:   snf list    PATIENT'S/FAMILY'S RESPONSE TO PLAN OF CARE: Pt appreciated csw concern and support. Pt spouse is hopeful that patient can transition to CIR for rehab. However pt  spouseis open to snf for rehab. Pt has history of dementia and is approrpriate but unable to  make decisons. csw will complete fl2 for md signature and initiate snf search.       Catha Gosselin, Theresia Majors  [phone] .07/19/2011 1750pm

## 2011-07-19 NOTE — Progress Notes (Signed)
We questions patient's ability to tolerate inpt rehab intensity of 3 hrs per day. I will follow up tomorrow with therapy and spouse to determine CIR vs SNF rehab option. Please call [phone] with questions. [employer] would also have to approve.

## 2011-07-19 NOTE — Progress Notes (Signed)
PATIENT DETAILS Name: Shannon Charles Age: 78 y.o. Sex: female Date of Birth: 03-14-1928 Admit Date: 07/14/2011 HYQ:MVHQION, SEAN, MD, MD  Subjective: Pain is much better after starting scheduled oxycodone-looks more comfortable Not eating well.  Objective: Vital signs in last 24 hours: Filed Vitals:   07/18/11 2100 07/19/11 0614 07/19/11 0740 07/19/11 1020  BP: 176/68 144/69 155/74 158/79  Pulse: 95 99 96   Temp: 98.2 F (36.8 C) 98.8 F (37.1 C) 98.1 F (36.7 C)   TempSrc: Oral Oral Oral   Resp: 22 20 20    Height:      Weight:      SpO2: 95% 94% 94%     Weight change:   Body mass index is 38.39 kg/(m^2).  Intake/Output from previous day:  Intake/Output Summary (Last 24 hours) at 07/19/11 1038 Last data filed at 07/19/11 0615  Gross per 24 hour  Intake    270 ml  Output    751 ml  Net   -481 ml    PHYSICAL EXAM: Gen Exam: Awake and alert with clear speech.   Neck: Supple, No JVD.   Chest: B/L Clear.   CVS: S1 S2 Regular, no murmurs.  Abdomen: soft, BS +, non tender, non distended.  Extremities: no edema, lower extremities warm to touch. Neurologic: Non Focal.   Skin: No Rash.   Wounds: N/A.    CONSULTS:  None  LAB RESULTS: CBC  Lab 07/15/11 0700 07/14/11 1643 07/14/11 0830 07/14/11 0247  WBC 7.9 7.2 6.4 --  HGB 11.1* 10.4* 10.5* 11.9*  HCT 34.9* 33.2* 33.1* 35.0*  PLT 180 179 181 --  MCV 91.6 93.3 94.3 --  MCH 29.1 29.2 29.9 --  MCHC 31.8 31.3 31.7 --  RDW 15.2 15.2 15.4 --  LYMPHSABS -- -- 1.6 --  MONOABS -- -- 0.8 --  EOSABS -- -- 0.3 --  BASOSABS -- -- 0.0 --  BANDABS -- -- -- --    Chemistries   Lab 07/19/11 0515 07/18/11 0545 07/17/11 0525 07/16/11 0516 07/15/11 0700  NA 151* 150* 149* 145 143  K 3.7 3.7 4.0 3.9 3.3*  CL 116* 113* 112 106 101  CO2 25 24 27 24 30   GLUCOSE 130* 164* 149* 214* 151*  BUN 41* 41* 45* 60* 66*  CREATININE 1.12* 1.10 1.30* 1.81* 2.48*  CALCIUM 9.5 9.5 9.3 9.1 9.0  MG -- -- -- -- --    GFR Estimated  Creatinine Clearance: 42.4 ml/min (by C-G formula based on Cr of 1.12).  Coagulation profile No results found for this basename: INR:5,PROTIME:5 in the last 168 hours  Cardiac Enzymes No results found for this basename: CK:3,CKMB:3,TROPONINI:3,MYOGLOBIN:3 in the last 168 hours  No components found with this basename: POCBNP:3 No results found for this basename: DDIMER:2 in the last 72 hours No results found for this basename: HGBA1C:2 in the last 72 hours No results found for this basename: CHOL:2,HDL:2,LDLCALC:2,TRIG:2,CHOLHDL:2,LDLDIRECT:2 in the last 72 hours No results found for this basename: TSH,T4TOTAL,FREET3,T3FREE,THYROIDAB in the last 72 hours No results found for this basename: VITAMINB12:2,FOLATE:2,FERRITIN:2,TIBC:2,IRON:2,RETICCTPCT:2 in the last 72 hours No results found for this basename: LIPASE:2,AMYLASE:2 in the last 72 hours  Urine Studies No results found for this basename: UACOL:2,UAPR:2,USPG:2,UPH:2,UTP:2,UGL:2,UKET:2,UBIL:2,UHGB:2,UNIT:2,UROB:2,ULEU:2,UEPI:2,UWBC:2,URBC:2,UBAC:2,CAST:2,CRYS:2,UCOM:2,BILUA:2 in the last 72 hours  MICROBIOLOGY: Recent Results (from the past 240 hour(s))  URINE CULTURE     Status: Normal   Collection Time   07/14/11  2:53 AM      Component Value Range Status Comment   Specimen Description URINE, CATHETERIZED  Final    Special Requests NONE   Final    Culture  Setup Time [id number]   Final    Colony Count >=100,000 COLONIES/ML   Final    Culture KLEBSIELLA PNEUMONIAE   Final    Report Status 07/16/2011 FINAL   Final    Organism ID, Bacteria KLEBSIELLA PNEUMONIAE   Final     RADIOLOGY STUDIES/RESULTS: Ct Head Wo Contrast  07/14/2011  *RADIOLOGY REPORT*  Clinical Data: Confusion and generalized weakness.  CT HEAD WITHOUT CONTRAST  Technique:  Contiguous axial images were obtained from the base of the skull through the vertex without contrast.  Comparison: CT and MRI 12/12/2010  Findings: Mild diffuse atrophy.  No mass effect or  midline shift. No abnormal extra-axial fluid collections.  Gray-white matter junctions are distinct.  Basal cisterns are not effaced.  No evidence of acute intracranial hemorrhage.  Visualized paranasal sinuses are not opacified.  No depressed skull fractures. Vascular calcifications.  No significant change since previous study.  IMPRESSION: No acute intracranial abnormality.  Original Report Authenticated By: Marlon Pel, M.D.   US Abdomen Complete  07/15/2011  *RADIOLOGY REPORT*  Clinical Data:  Hepatic encephalopathy.  Elevated liver function tests.  COMPLETE ABDOMINAL ULTRASOUND  Comparison:  Renal ultrasound of 02/08/2005.  Findings:  Gallbladder:  Small stones and probable sludge.  No wall thickening or pericholecystic fluid. Sonographic Murphy's sign was not elicited.  Common bile duct: Normal, 4 mm.  Liver: Suspicion of an irregular hepatic contour.  Example image 31.  IVC: Negative  Pancreas:  Poorly visualized due to overlying bowel gas.  Spleen:  Normal in size and echogenicity.  Right Kidney:  9.2 cm. Poorly visualized due to overlying bowel gas.  No hydronephrosis.  Left Kidney:  10.7 cm. No hydronephrosis.  Abdominal aorta:  Not visualized due to overlying bowel gas. No ascites.  IMPRESSION:  1.  Suspicion of cirrhosis.  Subtle findings of irregular hepatic contour. 2.  No other explanation for elevated liver function tests. 3.  Cholelithiasis without cholecystitis. 4. Portions of anatomy obscured by overlying bowel gas and patient body habitus.  Original Report Authenticated By: Consuello Bossier, M.D.   Dg Chest Port 1 View  07/14/2011  *RADIOLOGY REPORT*  Clinical Data: Weakness.  Hypertension and diabetes.  PORTABLE CHEST - 1 VIEW  Comparison: 01/15/2011  Findings: Shallow inspiration.  Cardiac enlargement with some prominence of the pulmonary vascularity and perihilar infiltration suggesting edema.  No focal airspace consolidation.  No blunting of costophrenic angles.  No pneumothorax.   IMPRESSION: Cardiac enlargement with pulmonary vascular congestion and mild perihilar edema.  Original Report Authenticated By: Marlon Pel, M.D.    MEDICATIONS: Scheduled Meds:    . amLODipine  5 mg Oral Daily  . cefTRIAXone (ROCEPHIN)  IV  1 g Intravenous Q24H  . donepezil  10 mg Oral Daily  . enoxaparin  40 mg Subcutaneous Q24H  . feeding supplement  237 mL Oral BID BM  . gabapentin  300 mg Oral TID  . insulin aspart  0-9 Units Subcutaneous Q4H  . lactulose  10 g Oral Daily  . latanoprost  1 drop Both Eyes QHS  . levothyroxine  175 mcg Oral Daily  . memantine  10 mg Oral BID  . metoprolol tartrate  12.5 mg Oral BID  . oxyCODONE  5 mg Oral TID  . rifaximin  550 mg Oral BID  . sertraline  100 mg Oral BID   Continuous Infusions:    . sodium chloride  PRN Meds:.hydrALAZINE, LORazepam, morphine injection, ondansetron (ZOFRAN) IV, ondansetron, simethicone, sodium phosphate  Antibiotics: Anti-infectives     Start     Dose/Rate Route Frequency Ordered Stop   07/16/11 1600   cefTRIAXone (ROCEPHIN) 1 g in dextrose 5 % 50 mL IVPB        1 g 100 mL/hr over 30 Minutes Intravenous Every 24 hours 07/16/11 0824     07/16/11 1000   rifaximin (XIFAXAN) tablet 550 mg        550 mg Oral 2 times daily 07/16/11 0826     07/16/11 0430   ciprofloxacin (CIPRO) IVPB 400 mg  Status:  Discontinued        400 mg 200 mL/hr over 60 Minutes Intravenous Daily at bedtime 07/16/11 0344 07/16/11 0824   07/14/11 2200   cefTRIAXone (ROCEPHIN) 1 g in dextrose 5 % 50 mL IVPB        1 g 100 mL/hr over 30 Minutes Intravenous  Once 07/14/11 1046 07/14/11 2233   07/14/11 0330   cefTRIAXone (ROCEPHIN) 1 g in dextrose 5 % 50 mL IVPB        1 g 100 mL/hr over 30 Minutes Intravenous  Once 07/14/11 0324 07/14/11 0447         s Assessment/Plan: 1. Acute encephalopathy: Resolved. Superimposed on dementia. CT head negative. Likely multifactorial: Acute renal failure, UTI,  hyperammonemia. 2. Hyperammonemia/Hepatic encephalopathy: Ammonia now within normal limits. Decrease lactulose. Continue rifaximin. No history of hepatitis or hepatic disease but ultrasound suggestive of cirrhosis. She does have persistent transaminitis . Acute hepatitis panel negative. Likely has NASH as the etiology.Will need outpatient gastroenterology follow up-when she is much better. 3. Acute renal failure: Resolved. History and clinical findings most suggestive of prerenal azotemia secondary to naproxen and diuretic use.  4. Hypertension: Moderately well  controlled. Continue with Lopressor and Norvasc. 5. Hypercapnia with metabolic compensation: Appears to be asymptomatic at this point. Etiology unclear. 6. Normocytic anemia: Stable. No signs of bleeding. Followup as outpatient. 7. Lower extremity edema: Not dramatic. Follow clinically. Although she has lower extremity edema she is apparently intravascularly dry. 2-D echocardiogram May 2012: Left ventricular ejection fraction 55-60%. Grade 1 diastolic dysfunction. 8. Hypokalemia: Repleted. 9. Urinary tract infection: D/C Rocephin-start Ceftin for 3 more days from 3/25 and then stop 10. Persistent transaminitis: Present since at least June 2009.Likely has NASH.Will need liver biopsy and further GI work up-currently patient very deconditioned, will defer further work up to the outpatient setting, when patient much stronger  11. Hypothyroidism: TSH low. Decreased Synthroid dose. 12. Diabetes mellitus, insulin requiring, uncontrolled: Hemoglobin A1c 9.4. Stable as an inpatient. Sliding scale insulin. Resume Levemir when needed. 13. Depression: Continue Zoloft. 14. Dementia: Continue Aricept, Namenda 15. Hypernatremia:Have encouraged PO intake, however appetitie still poor, will gently hydrate, and hopefully with ongoing clinical improvement she will start eating/drinking soon. 16. ? Dysphagia: Appreciate SLP eval-now on Dys 2 diet, discussed risks  of occult aspiration with patient and husband at bedside, they are agreeable with continuing with oral intake. Chronic Pain Syndrome: see's Pain MD as outpatient, continue with current dosing of oxycodone and Neurontin, restart Elavil.  Disposition: CIR vs SNF  DVT Prophylaxis: Prophylactic Lovenox  Code Status: Full Code  Maretta Bees,  MD. 07/19/2011, 10:38 AM

## 2011-07-19 NOTE — Progress Notes (Signed)
Nutrition Follow-up/Consult  Diet Order:  Dysphagia 2, thin liquids PO intake 0-5% last 3 meals. Prior to this pt was eating 100%. Appears that decrease in intake was about the same time as diets downgrade to D1, possible dislike for food causing poor po intake?  Bedside swallow eval upgraded diet to D2 today. Pt states she is tolerating this fine. D2 breakfast tray in room, had not been touched. Pt willing to try supplements between meals.   Meds: Scheduled Meds:   . amLODipine  5 mg Oral Daily  . cefTRIAXone (ROCEPHIN)  IV  1 g Intravenous Q24H  . donepezil  10 mg Oral Daily  . enoxaparin  40 mg Subcutaneous Q24H  . gabapentin  300 mg Oral TID  . insulin aspart  0-9 Units Subcutaneous Q4H  . lactulose  10 g Oral Daily  . latanoprost  1 drop Both Eyes QHS  . levothyroxine  175 mcg Oral Daily  . memantine  10 mg Oral BID  . metoprolol tartrate  12.5 mg Oral BID  . oxyCODONE  5 mg Oral TID  . rifaximin  550 mg Oral BID  . sertraline  100 mg Oral BID   Continuous Infusions:   . sodium chloride     PRN Meds:.hydrALAZINE, LORazepam, morphine injection, ondansetron (ZOFRAN) IV, ondansetron, simethicone, sodium phosphate  Labs:  CMP     Component Value Date/Time   NA 151* 07/19/2011 0515   K 3.7 07/19/2011 0515   CL 116* 07/19/2011 0515   CO2 25 07/19/2011 0515   GLUCOSE 130* 07/19/2011 0515   BUN 41* 07/19/2011 0515   CREATININE 1.12* 07/19/2011 0515   CALCIUM 9.5 07/19/2011 0515   CALCIUM 9.4 12/22/2009 2322   PROT 6.9 07/15/2011 0700   ALBUMIN 3.5 07/15/2011 0700   AST 34 07/15/2011 0700   ALT 17 07/15/2011 0700   ALKPHOS 100 07/15/2011 0700   BILITOT 0.6 07/15/2011 0700   GFRNONAA 45* 07/19/2011 0515   GFRAA 52* 07/19/2011 0515     Intake/Output Summary (Last 24 hours) at 07/19/11 1017 Last data filed at 07/19/11 0615  Gross per 24 hour  Intake    270 ml  Output    751 ml  Net   -481 ml    Weight Status:  No new weights  Re-estimated needs:  1650-1800 kcal, 50-60 gm  protein  Nutrition Dx:  Altered nutrition related lab values, ongoing  Goal:  Glucose will be WNL, unmet  Intervention:   1. RD will add Glucerna shakes BID between meals. 2. Pt not appropriate for education at this time, will continue to monitor  3. Recommend adding carb mod medium restrictions to D2 diet Monitor:  PO intake, weight, labs, I/O's   Rudean Haskell Pager #:  [phone]

## 2011-07-19 NOTE — Progress Notes (Signed)
Utilization Review Completed.Moris Ratchford T4/25/2013   

## 2011-07-19 NOTE — Progress Notes (Signed)
   CARE MANAGEMENT NOTE 07/19/2011  Patient:  Shannon Charles, Shannon Charles   Account Number:  [id number]  Date Initiated:  07/16/2011  Documentation initiated by:  Letha Cape  Subjective/Objective Assessment:   dx toxic metabolic encephalopathy  admit- lives with spouse.     Action/Plan:   pt/ot eval   Anticipated DC Date:  07/18/2011   Anticipated DC Plan:  SKILLED NURSING FACILITY  In-house referral  Clinical Social Worker      DC Planning Services  CM consult      Choice offered to / List presented to:             Status of service:  In process, will continue to follow Medicare Important Message given?   (If response is "NO", the following Medicare IM given date fields will be blank) Date Medicare IM given:   Date Additional Medicare IM given:    Discharge Disposition:    Per UR Regulation:    If discussed at Long Length of Stay Meetings, dates discussed:    Comments:  07/19/11 16:56 Letha Cape RN, BSN [phone] patient for CIR consult but looks like there is concern if patient can tolerate 3hrs of therapy, informed CSW to continue to fax for snf per MD.  07/16/11 16:12 Letha Cape RN, BSN [phone] patient lives with spouse, await pt/ot eval, Possible for ? snf, CSW aware of possiblity.

## 2011-07-20 LAB — GLUCOSE, CAPILLARY
Glucose-Capillary: 125 mg/dL — ABNORMAL HIGH (ref 70–99)
Glucose-Capillary: 168 mg/dL — ABNORMAL HIGH (ref 70–99)

## 2011-07-20 LAB — BASIC METABOLIC PANEL
GFR calc non Af Amer: 35 mL/min — ABNORMAL LOW (ref >90)
Glucose, Bld: 121 mg/dL — ABNORMAL HIGH (ref 70–99)
Potassium: 3.6 mEq/L (ref 3.5–5.1)
Sodium: 145 mEq/L (ref 135–145)

## 2011-07-20 MED ORDER — CEFUROXIME AXETIL 250 MG PO TABS: 250.0000 mg | ORAL_TABLET | Freq: Two times a day (BID) | ORAL | Status: AC

## 2011-07-20 MED ORDER — METOPROLOL TARTRATE 12.5 MG HALF TABLET: 12.5000 mg | ORAL_TABLET | Freq: Two times a day (BID) | ORAL | Status: DC

## 2011-07-20 MED ORDER — LORAZEPAM 0.5 MG PO TABS: 0.5000 mg | ORAL_TABLET | Freq: Two times a day (BID) | ORAL | Status: AC | PRN

## 2011-07-20 MED ORDER — CEFUROXIME AXETIL 500 MG PO TABS
500.0000 mg | ORAL_TABLET | Freq: Two times a day (BID) | ORAL | Status: DC
  Filled 2011-07-20 (×2): qty 1

## 2011-07-20 MED ORDER — OXYCODONE HCL 5 MG PO TABS: 5.0000 mg | ORAL_TABLET | Freq: Four times a day (QID) | ORAL | Status: AC

## 2011-07-20 MED ORDER — CEFUROXIME AXETIL 250 MG PO TABS
250.0000 mg | ORAL_TABLET | Freq: Two times a day (BID) | ORAL | Status: DC
  Administered 2011-07-20 – 2011-07-21 (×2): 250 mg via ORAL
  Filled 2011-07-20 (×4): qty 1

## 2011-07-20 MED ORDER — LACTULOSE 10 GM/15ML PO SOLN: 10.0000 g | Freq: Every day | ORAL | Status: AC

## 2011-07-20 MED ORDER — RIFAXIMIN 550 MG PO TABS: 550.0000 mg | ORAL_TABLET | Freq: Two times a day (BID) | ORAL | Status: DC

## 2011-07-20 MED ORDER — LEVOTHYROXINE SODIUM 175 MCG PO TABS: 175.0000 ug | ORAL_TABLET | Freq: Every day | ORAL | Status: DC

## 2011-07-20 MED ORDER — AMLODIPINE BESYLATE 5 MG PO TABS: 5.0000 mg | ORAL_TABLET | Freq: Every day | ORAL | Status: DC

## 2011-07-20 MED ORDER — INSULIN ASPART 100 UNIT/ML ~~LOC~~ SOLN: SUBCUTANEOUS | Status: DC

## 2011-07-20 MED ORDER — FUROSEMIDE 20 MG PO TABS: 60.0000 mg | ORAL_TABLET | Freq: Every day | ORAL | Status: DC

## 2011-07-20 MED ORDER — GLUCERNA SHAKE PO LIQD: 237.0000 mL | Freq: Two times a day (BID) | ORAL | Status: DC

## 2011-07-20 MED ORDER — FUROSEMIDE 80 MG PO TABS
80.0000 mg | ORAL_TABLET | Freq: Every day | ORAL | Status: DC
  Administered 2011-07-20: 80 mg via ORAL
  Filled 2011-07-20: qty 1

## 2011-07-20 MED ORDER — FUROSEMIDE 40 MG PO TABS
60.0000 mg | ORAL_TABLET | Freq: Every day | ORAL | Status: DC
  Administered 2011-07-21: 60 mg via ORAL
  Filled 2011-07-20: qty 1

## 2011-07-20 NOTE — Progress Notes (Signed)
Speech Language Pathology Dysphagia Treatment  Patient Details Name: Shannon Charles MRN: [id number] DOB: June 17, 1927 Today's Date: 07/20/2011  SLP Assessment/Plan/Recommendation Assessment / Recommendations / Plan Clinical Impression Statement: Pt. in bed feeding herself breakfast with improved manual grip, ROM, and endurance for self feeding (although slow movements).  One delayed cough as SLP heard while passing room possibly indicative of poor airway protection.  She required mild verbal reminders to take small sips from straw.  No shortness of breath present todat. Pt. is at mild-moderately high aspiration risk with slow oral phase and cognitive deficts.  Pt. and husband report no difficulty with masticatin diet since upgraded to Dys 2 from Dys 1.  Recommend pt. continue Dys 2 texture and thin liquids, pt. to continue to self feed and staff assist if she fatigues. Continue with Current Diet: Dysphagia 2 (fine chop);Thin liquid Liquids provided via: Cup;Straw Medication Administration: Whole meds with puree Supervision: Patient able to self feed;Full supervision/cueing for compensatory strategies Compensations: Slow rate;Small sips/bites;Check for pocketing Postural Changes and/or Swallow Maneuvers: Seated upright 90 degrees Oral Care Recommendations: Oral care BID Plan: Continue with current plan of care Swallowing Goals  SLP Swallowing Goals Swallow Study Goal #1 - Progress: Progressing toward goal Swallow Study Goal #3 - Progress: Progressing toward goal  General Temperature Spikes Noted: No Respiratory Status: Room air Behavior/Cognition: Alert;Cooperative Oral Cavity - Dentition: Dentures, bottom;Dentures, top Patient Positioning: Upright in bed  Oral Cavity - Oral Hygiene Does patient have any of the following "at risk" factors?: Other - dysphagia Brush patient's teeth BID with toothbrush (using toothpaste with fluoride): Yes Patient is HIGH RISK - Oral Care Protocol followed  (see row info): Yes   Dysphagia Treatment Treatment focused on: Skilled observation of diet tolerance;Patient/family/caregiver education;Facilitation of oral preparatory phase;Utilization of compensatory strategies;Facilitation of pharyngeal phase;Facilitation of oral phase Treatment Methods/Modalities: Skilled observation Feeding: Able to feed self Liquids provided via: Cup;Straw Oral Phase Signs & Symptoms: Prolonged bolus formation Pharyngeal Phase Signs & Symptoms: Delayed cough Type of cueing: Verbal Amount of cueing: Minimal   [name] M.Ed CCC-SLP Pager [phone]  07/20/2011  [name] 07/20/2011, 8:57 AM

## 2011-07-20 NOTE — Progress Notes (Signed)
Discussed with P.T. Patient not at a level to be able to tolerate intense inpt rehab. Husband at bedside and aware. Recommend SNF rehab at this time. [phone] with questions.

## 2011-07-20 NOTE — Progress Notes (Signed)
Patient plans to dc to [facility] tomorrow if medically stable. CSW confirmed with facility and received pasarr and insurance authorization.   Pt ambulance authoirization is [phone].   Marland KitchenClinical social worker continuing to follow pt to assist with pt dc plans and further csw needs.   Catha Gosselin, Theresia Majors  [phone] .07/20/2011 1735pm

## 2011-07-20 NOTE — Progress Notes (Signed)
Clinical social worker spoke with pt and pt family who chose [facility]. Patient plans to dc to [facility] tomorrow. CSW awaiting [employer] authorization and pasarr.   .Clinical social worker continuing to follow pt to assist with pt dc plans and further csw needs.   Catha Gosselin, LCSWA  [phone] 07/20/2011 1739

## 2011-07-20 NOTE — Progress Notes (Signed)
Physical Therapy Treatment Patient Details Name: Shannon Charles MRN: [id number] DOB: [birth year] Today's Date: 07/20/2011 Time: 1208-1228 PT Time Calculation (min): 20 min  PT Assessment / Plan / Recommendation Comments on Treatment Session  Pt continues to make steady progress.  Do not feel pt can tolerate inpatient rehab.    Follow Up Recommendations  Skilled nursing facility    Equipment Recommendations  Defer to next venue    Frequency Min 2X/week   Plan Discharge plan needs to be updated;Frequency needs to be updated    Precautions / Restrictions Precautions Precautions: Fall   Pertinent Vitals/Pain N/A    Mobility  Transfers Sit to Stand: 1: +2 Total assist;From chair/3-in-1;With upper extremity assist;With armrests Sit to Stand: Patient Percentage: 70% Stand to Sit: 4: Min guard;To chair/3-in-1;With armrests;With upper extremity assist Details for Transfer Assistance: Verbal cues to scoot to edge of chair prior to stand Ambulation/Gait Ambulation/Gait Assistance: 4: Min assist Ambulation Distance (Feet): 75 Feet Assistive device: Rolling walker Ambulation/Gait Assistance Details: +2 assist for IV and to bring chair behind pt. Gait Pattern: Decreased step length - left;Decreased step length - right Gait velocity: slow cadence General Gait Details: Cues to stand more upright    Exercises     PT Goals Acute Rehab PT Goals PT Goal: Sit to Stand - Progress: Progressing toward goal PT Goal: Stand to Sit - Progress: Progressing toward goal Pt will Ambulate: 51 - 150 feet;with supervision;with least restrictive assistive device PT Goal: Ambulate - Progress: Updated due to goal met  Visit Information  Last PT Received On: 07/20/11 Assistance Needed: +2    Subjective Data  Subjective: "I need to go to the bathroom,' pt stated   Cognition  Overall Cognitive Status: History of cognitive impairments - at baseline Arousal/Alertness: Awake/alert Behavior During  Session: [facility] for tasks performed    Balance  Static Standing Balance Static Standing - Balance Support:  (on rolling walker) Static Standing - Level of Assistance: 5: Stand by assistance  End of Session PT - End of Session Equipment Utilized During Treatment: Gait belt Activity Tolerance: Patient tolerated treatment well Patient left: in chair;with call bell/phone within reach;with family/visitor present Nurse Communication: Mobility status    Evett Kassa 07/20/2011, 1:52 PM  [employer] PT [phone]

## 2011-07-20 NOTE — Discharge Summary (Signed)
PATIENT DETAILS Name: Shannon Charles Age: 78 y.o. Sex: female Date of Birth: 03-18-1928 MRN: [id number]. Admit Date: 07/14/2011 Admitting Physician: Ron Parker, MD NWG:[id number], Gregary Signs, MD, MD  PRIMARY DISCHARGE DIAGNOSIS:  Principal Problem:  *Toxic metabolic encephalopathy Active Problems:  HYPOTHYROIDISM  HYPERLIPIDEMIA  DEMENTIA  Edema  ARF (acute renal failure)  UTI (lower urinary tract infection)  Constipation  Hypokalemia  Diabetes mellitus      PAST MEDICAL HISTORY: Past Medical History  Diagnosis Date  . DEMENTIA 08/23/2006  . DEPRESSION 08/23/2006  . DIABETES MELLITUS, TYPE I 08/23/2006  . HYPERLIPIDEMIA 08/23/2006  . HYPERTENSION 08/23/2006  . HYPOTHYROIDISM 08/23/2006  . OSTEOARTHRITIS 08/23/2006  . RENAL INSUFFICIENCY 08/23/2006  . Personal history of colonic polyps 04/29/2007  . Lung disease, restrictive     Mild, by pulmonary function test in October 2006 with mildly reduce DLCO  . Pain in limb   . Chronic pain syndrome   . Lack of coordination   . Lumbago   . Primary localized osteoarthrosis, lower leg   . Disorders of bursae and tendons in shoulder region, unspecified     DISCHARGE MEDICATIONS: Medication List  As of 07/20/2011 12:42 PM   STOP taking these medications         insulin detemir 100 UNIT/ML injection      Insulin Pen Needle 32G X 4 MM Misc      lisdexamfetamine 50 MG capsule      losartan 100 MG tablet      metolazone 2.5 MG tablet      naproxen sodium 220 MG tablet      oxyCODONE-acetaminophen 5-325 MG per tablet         TAKE these medications         amitriptyline 10 MG tablet   Commonly known as: ELAVIL   10 mg at bedtime.      amLODipine 5 MG tablet   Commonly known as: NORVASC   Take 1 tablet (5 mg total) by mouth daily.      aspirin 81 MG tablet   Take 81 mg by mouth daily.      cefUROXime 250 MG tablet   Commonly known as: CEFTIN   Take 1 tablet (250 mg total) by mouth 2 (two) times daily with a meal. Take  for 2 more days from 07/20/11      donepezil 10 MG tablet   Commonly known as: ARICEPT   Take 10 mg by mouth daily.      dorzolamide-timolol 22.3-6.8 MG/ML ophthalmic solution   Commonly known as: COSOPT   Place 1 drop into both eyes daily.      feeding supplement Liqd   Take 237 mLs by mouth 2 (two) times daily between meals.      furosemide 20 MG tablet   Commonly known as: LASIX   Take 3 tablets (60 mg total) by mouth daily.      gabapentin 300 MG capsule   Commonly known as: NEURONTIN   Take 300 mg by mouth 3 (three) times daily.      insulin aspart 100 UNIT/ML injection   Commonly known as: novoLOG   0-9 Units, Subcutaneous, 3 times daily with meals  CBG < 70: implement hypoglycemia protocol  CBG 70 - 120: 0 units  CBG 121 - 150: 1 unit  CBG 151 - 200: 2 units  CBG 201 - 250: 3 units  CBG 251 - 300: 5 units  CBG 301 - 350: 7 units  CBG 351 -  400: 9 units  CBG > 400: call MD      lactulose 10 GM/15ML solution   Commonly known as: CHRONULAC   Take 15 mLs (10 g total) by mouth daily.      latanoprost 0.005 % ophthalmic solution   Commonly known as: XALATAN   Place 1 drop into both eyes at bedtime.      levothyroxine 175 MCG tablet   Commonly known as: SYNTHROID, LEVOTHROID   Take 1 tablet (175 mcg total) by mouth daily.      LORazepam 0.5 MG tablet   Commonly known as: ATIVAN   Take 1 tablet (0.5 mg total) by mouth 2 (two) times daily as needed. For anxiety      memantine 10 MG tablet   Commonly known as: NAMENDA   Take 10 mg by mouth 2 (two) times daily.      metoprolol tartrate 12.5 mg Tabs   Commonly known as: LOPRESSOR   Take 0.5 tablets (12.5 mg total) by mouth 2 (two) times daily.      mulitivitamin with minerals Tabs   Take 1 tablet by mouth daily.      oxyCODONE 5 MG immediate release tablet   Commonly known as: Oxy IR/ROXICODONE   Take 1 tablet (5 mg total) by mouth 4 (four) times daily.      rifaximin 550 MG Tabs   Commonly known  as: XIFAXAN   Take 1 tablet (550 mg total) by mouth 2 (two) times daily.      sertraline 50 MG tablet   Commonly known as: ZOLOFT   Take 100 mg by mouth 2 (two) times daily.      simvastatin 40 MG tablet   Commonly known as: ZOCOR   Take 40 mg by mouth every evening.             BRIEF HPI:  See H&P, Labs, Consult and Test reports for all details in brief, 78 year old woman presented to the emergency department secondary to confusion and lethargy for 2 days since beginning metolazone.  CONSULTATIONS:   None  PERTINENT RADIOLOGIC STUDIES: Ct Head Wo Contrast  07/14/2011  *RADIOLOGY REPORT*  Clinical Data: Confusion and generalized weakness.  CT HEAD WITHOUT CONTRAST  Technique:  Contiguous axial images were obtained from the base of the skull through the vertex without contrast.  Comparison: CT and MRI 12/12/2010  Findings: Mild diffuse atrophy.  No mass effect or midline shift. No abnormal extra-axial fluid collections.  Gray-white matter junctions are distinct.  Basal cisterns are not effaced.  No evidence of acute intracranial hemorrhage.  Visualized paranasal sinuses are not opacified.  No depressed skull fractures. Vascular calcifications.  No significant change since previous study.  IMPRESSION: No acute intracranial abnormality.  Original Report Authenticated By: Marlon Pel, M.D.   US Abdomen Complete  07/15/2011  *RADIOLOGY REPORT*  Clinical Data:  Hepatic encephalopathy.  Elevated liver function tests.  COMPLETE ABDOMINAL ULTRASOUND  Comparison:  Renal ultrasound of 02/08/2005.  Findings:  Gallbladder:  Small stones and probable sludge.  No wall thickening or pericholecystic fluid. Sonographic Murphy's sign was not elicited.  Common bile duct: Normal, 4 mm.  Liver: Suspicion of an irregular hepatic contour.  Example image 31.  IVC: Negative  Pancreas:  Poorly visualized due to overlying bowel gas.  Spleen:  Normal in size and echogenicity.  Right Kidney:  9.2 cm. Poorly  visualized due to overlying bowel gas.  No hydronephrosis.  Left Kidney:  10.7 cm. No hydronephrosis.  Abdominal  aorta:  Not visualized due to overlying bowel gas. No ascites.  IMPRESSION:  1.  Suspicion of cirrhosis.  Subtle findings of irregular hepatic contour. 2.  No other explanation for elevated liver function tests. 3.  Cholelithiasis without cholecystitis. 4. Portions of anatomy obscured by overlying bowel gas and patient body habitus.  Original Report Authenticated By: Consuello Bossier, M.D.   Dg Chest Port 1 View  07/14/2011  *RADIOLOGY REPORT*  Clinical Data: Weakness.  Hypertension and diabetes.  PORTABLE CHEST - 1 VIEW  Comparison: 01/15/2011  Findings: Shallow inspiration.  Cardiac enlargement with some prominence of the pulmonary vascularity and perihilar infiltration suggesting edema.  No focal airspace consolidation.  No blunting of costophrenic angles.  No pneumothorax.  IMPRESSION: Cardiac enlargement with pulmonary vascular congestion and mild perihilar edema.  Original Report Authenticated By: Marlon Pel, M.D.     PERTINENT LAB RESULTS: CBC: No results found for this basename: WBC:2,HGB:2,HCT:2,PLT:2 in the last 72 hours CMET CMP     Component Value Date/Time   NA 145 07/20/2011 0610   K 3.6 07/20/2011 0610   CL 110 07/20/2011 0610   CO2 24 07/20/2011 0610   GLUCOSE 121* 07/20/2011 0610   BUN 42* 07/20/2011 0610   CREATININE 1.36* 07/20/2011 0610   CALCIUM 9.0 07/20/2011 0610   CALCIUM 9.4 12/22/2009 2322   PROT 6.9 07/15/2011 0700   ALBUMIN 3.5 07/15/2011 0700   AST 34 07/15/2011 0700   ALT 17 07/15/2011 0700   ALKPHOS 100 07/15/2011 0700   BILITOT 0.6 07/15/2011 0700   GFRNONAA 35* 07/20/2011 0610   GFRAA 41* 07/20/2011 0610    GFR Estimated Creatinine Clearance: 34.9 ml/min (by C-G formula based on Cr of 1.36). No results found for this basename: LIPASE:2,AMYLASE:2 in the last 72 hours No results found for this basename: CKTOTAL:3,CKMB:3,CKMBINDEX:3,TROPONINI:3 in the  last 72 hours No components found with this basename: POCBNP:3 No results found for this basename: DDIMER:2 in the last 72 hours No results found for this basename: HGBA1C:2 in the last 72 hours No results found for this basename: CHOL:2,HDL:2,LDLCALC:2,TRIG:2,CHOLHDL:2,LDLDIRECT:2 in the last 72 hours No results found for this basename: TSH,T4TOTAL,FREET3,T3FREE,THYROIDAB in the last 72 hours No results found for this basename: VITAMINB12:2,FOLATE:2,FERRITIN:2,TIBC:2,IRON:2,RETICCTPCT:2 in the last 72 hours Coags: No results found for this basename: PT:2,INR:2 in the last 72 hours Microbiology: Recent Results (from the past 240 hour(s))  URINE CULTURE     Status: Normal   Collection Time   07/14/11  2:53 AM      Component Value Range Status Comment   Specimen Description URINE, CATHETERIZED   Final    Special Requests NONE   Final    Culture  Setup Time [id number]   Final    Colony Count >=100,000 COLONIES/ML   Final    Culture KLEBSIELLA PNEUMONIAE   Final    Report Status 07/16/2011 FINAL   Final    Organism ID, Bacteria KLEBSIELLA PNEUMONIAE   Final      BRIEF HOSPITAL COURSE:   Principal Problem:  -Acute encephalopathy: Resolved. Superimposed on dementia. CT head negative. Likely multifactorial: Acute renal failure, UTI, hyperammonemia.  Hyperammonemia/Hepatic encephalopathy: Ammonia now within normal limits. Decrease lactulose. Continue rifaximin. No history of hepatitis or hepatic disease but ultrasound suggestive of cirrhosis. She does have persistent transaminitis . Acute hepatitis panel negative. Likely has NASH as the etiology.Will need outpatient gastroenterology follow up-when she is much better.  ?Liver Cirrhosis:Ultrasound of liver showed possible Liver Cirrhosis, likely secondary to NASH.Further work including GI eval  and liver biopsy will need to be done as outpatient, patient currently very deconditioned and weak to pursue further work up.  Acute renal failure:  Resolved. History and clinical findings most suggestive of prerenal azotemia secondary to naproxen and diuretic use. Likely has underlying CKD at baseline  Hypertension: Moderately well controlled. Continue with Lopressor and Norvasc.Will stop her Losartan for now  Lower extremity edema: Not dramatic. Follow clinically. Although she has lower extremity edema she is apparently intravascularly dry. 2-D echocardiogram May 2012: Left ventricular ejection fraction 55-60%. Grade 1 diastolic dysfunction.Will resume lasix on discharge, family wants to avoid Metolazone.  Hypokalemia: Repleted, likely secondary to poor oral intake and diuretic use  Urinary tract infection: D/C Rocephin-start Ceftin for 3 more days from 3/25 and then stop  Persistent transaminitis: Present since at least June 2009.Likely has NASH.Will need liver biopsy and further GI work up-currently patient very deconditioned  Hypothyroidism: TSH low. Decreased Synthroid dose to above, previously on 200 mcg.  Diabetes mellitus, insulin requiring, uncontrolled: Hemoglobin A1c 9.4. Stable as an inpatient onSliding scale insulin. Resume Levemir when oral intake is much better  Hypernatremia:secondary to poor oral intake, was very gently hydrated for around 6 hours, family claims appetite slowly improving as well.Na now normalized  Dysphagia: Appreciate SLP eval-now on Dys 2 diet, discussed risks of occult aspiration with patient and husband at bedside, they are agreeable with continuing with oral intake.This is likely secondary to generalized deconditioning and weakness  Dementia: Continue Aricept, Namenda  Depression: Continue Zoloft.  Chronic Pain Syndrome: see's Pain MD as outpatient, continue with current dosing of oxycodone and Neurontin, restart Elavil.  Deconditioning:now is the major issue,was evaluated by CIR, they felt that patient will not be able to participate 3 hours, as a result patient will be transferred to SNF when a  bed is available   TODAY-DAY OF DISCHARGE:  Subjective:   Shannon Charles today continues to make clinical improving slowly, has been having bowel movement and urinating. Her appetite is slowly improving as well.  Objective:   Blood pressure 122/66, pulse 73, temperature 98.4 F (36.9 C), temperature source Oral, resp. rate 20, height 5\' 2"  (1.575 m), weight 95.2 kg (209 lb 14.1 oz), SpO2 96.00%.  Intake/Output Summary (Last 24 hours) at 07/20/11 1242 Last data filed at 07/20/11 0951  Gross per 24 hour  Intake 1473.33 ml  Output    630 ml  Net 843.33 ml    Exam Awake Alert, Oriented *3, No new F.N deficits, Normal affect [place].AT,PERRAL Supple Neck,No JVD, No cervical lymphadenopathy appriciated.  Symmetrical Chest wall movement, Good air movement bilaterally, CTAB RRR,No Gallops,Rubs or new Murmurs, No Parasternal Heave +ve B.Sounds, Abd Soft, Non tender, No organomegaly appriciated, No rebound -guarding or rigidity. No Cyanosis, Clubbing or edema, No new Rash or bruise  DISPOSITION: SNF when bed available   DISCHARGE INSTRUCTIONS:    Follow-up Information    Follow up with Romero Belling, MD in 1 week after discharge from SNF   Contact information:   [address] [facility] [address] [place] [address] [phone]          Total Time spent on discharge equals 45 minutes.  SignedJeoffrey Massed 07/20/2011 12:42 PM

## 2011-07-21 LAB — GLUCOSE, CAPILLARY: Glucose-Capillary: 137 mg/dL — ABNORMAL HIGH (ref 70–99)

## 2011-07-21 NOTE — Progress Notes (Signed)
CSW informed Pt ready for d/c  Confirmed with facility, family.  Pt transport via [facility]. Milus Banister MSW,LCSW w/e Coverage [phone]

## 2011-07-21 NOTE — Progress Notes (Signed)
Marjorie Smolder to be D/C'd Skilled nursing facility per MD order.  Discussed with the patient and all questions fully answered.   Nora, Sabey  Home Medication Instructions ZOX:[id number]   Printed on:07/21/11 1214  Medication Information                    amitriptyline (ELAVIL) 10 MG tablet 10 mg at bedtime.            donepezil (ARICEPT) 10 MG tablet Take 10 mg by mouth daily.             dorzolamide-timolol (COSOPT) 22.3-6.8 MG/ML ophthalmic solution Place 1 drop into both eyes daily.            aspirin 81 MG tablet Take 81 mg by mouth daily.           sertraline (ZOLOFT) 50 MG tablet Take 100 mg by mouth 2 (two) times daily.           latanoprost (XALATAN) 0.005 % ophthalmic solution Place 1 drop into both eyes at bedtime.           simvastatin (ZOCOR) 40 MG tablet Take 40 mg by mouth every evening.           Multiple Vitamin (MULITIVITAMIN WITH MINERALS) TABS Take 1 tablet by mouth daily.           memantine (NAMENDA) 10 MG tablet Take 10 mg by mouth 2 (two) times daily.           gabapentin (NEURONTIN) 300 MG capsule Take 300 mg by mouth 3 (three) times daily.           furosemide (LASIX) 20 MG tablet Take 3 tablets (60 mg total) by mouth daily.           levothyroxine (SYNTHROID, LEVOTHROID) 175 MCG tablet Take 1 tablet (175 mcg total) by mouth daily.           amLODipine (NORVASC) 5 MG tablet Take 1 tablet (5 mg total) by mouth daily.           cefUROXime (CEFTIN) 250 MG tablet Take 1 tablet (250 mg total) by mouth 2 (two) times daily with a meal. Take for 2 more days from 07/20/11           feeding supplement (GLUCERNA SHAKE) LIQD Take 237 mLs by mouth 2 (two) times daily between meals.           lactulose (CHRONULAC) 10 GM/15ML solution Take 15 mLs (10 g total) by mouth daily.           metoprolol tartrate (LOPRESSOR) 12.5 mg TABS Take 0.5 tablets (12.5 mg total) by mouth 2 (two) times daily.           oxyCODONE (OXY IR/ROXICODONE) 5 MG immediate  release tablet Take 1 tablet (5 mg total) by mouth 4 (four) times daily.           LORazepam (ATIVAN) 0.5 MG tablet Take 1 tablet (0.5 mg total) by mouth 2 (two) times daily as needed. For anxiety           rifaximin (XIFAXAN) 550 MG TABS Take 1 tablet (550 mg total) by mouth 2 (two) times daily.           insulin aspart (NOVOLOG) 100 UNIT/ML injection 0-9 Units, Subcutaneous, 3 times daily with meals CBG < 70: implement hypoglycemia protocol CBG 70 - 120: 0 units CBG 121 - 150: 1 unit CBG 151 - 200: 2  units CBG 201 - 250: 3 units CBG 251 - 300: 5 units CBG 301 - 350: 7 units CBG 351 - 400: 9 units CBG > 400: call MD              Cindra Eves, RN 07/21/2011 12:14 PM

## 2011-07-23 ENCOUNTER — Encounter
Payer: Medicare Other | Attending: Physical Medicine and Rehabilitation | Admitting: Physical Medicine and Rehabilitation

## 2011-07-23 DIAGNOSIS — M7500 Adhesive capsulitis of unspecified shoulder: Secondary | ICD-10-CM | POA: Insufficient documentation

## 2011-07-23 DIAGNOSIS — M67919 Unspecified disorder of synovium and tendon, unspecified shoulder: Secondary | ICD-10-CM | POA: Insufficient documentation

## 2011-07-23 DIAGNOSIS — R279 Unspecified lack of coordination: Secondary | ICD-10-CM | POA: Insufficient documentation

## 2011-07-23 DIAGNOSIS — M549 Dorsalgia, unspecified: Secondary | ICD-10-CM | POA: Insufficient documentation

## 2011-07-23 DIAGNOSIS — M719 Bursopathy, unspecified: Secondary | ICD-10-CM | POA: Insufficient documentation

## 2011-07-23 DIAGNOSIS — M545 Low back pain, unspecified: Secondary | ICD-10-CM | POA: Insufficient documentation

## 2011-07-23 DIAGNOSIS — M25519 Pain in unspecified shoulder: Secondary | ICD-10-CM | POA: Insufficient documentation

## 2011-07-23 DIAGNOSIS — M25569 Pain in unspecified knee: Secondary | ICD-10-CM | POA: Insufficient documentation

## 2011-07-23 NOTE — Care Management Note (Signed)
    Page 1 of 1   07/23/2011     10:27:18 AM   CARE MANAGEMENT NOTE 07/23/2011  Patient:  Shannon Charles, Shannon Charles   Account Number:  [id number]  Date Initiated:  07/16/2011  Documentation initiated by:  Letha Cape  Subjective/Objective Assessment:   dx toxic metabolic encephalopathy  admit- lives with spouse.     Action/Plan:   pt/ot eval   Anticipated DC Date:  07/18/2011   Anticipated DC Plan:  SKILLED NURSING FACILITY  In-house referral  Clinical Social Worker      DC Planning Services  CM consult      Choice offered to / List presented to:             Status of service:  Completed, signed off Medicare Important Message given?   (If response is "NO", the following Medicare IM given date fields will be blank) Date Medicare IM given:   Date Additional Medicare IM given:    Discharge Disposition:  SKILLED NURSING FACILITY  Per UR Regulation:  Reviewed for med. necessity/level of care/duration of stay  If discussed at Long Length of Stay Meetings, dates discussed:    Comments:  07/20/11- 1000- Donn Pierini RN BSN [phone] Pt for SNF discharge today, CSW following for placement needs. Pt not CIR appropriate due to inability to participate in 3 hrs. of therapy a day.  07/19/11 16:56 Letha Cape RN, BSN [phone] patient for CIR consult but looks like there is concern if patient can tolerate 3hrs of therapy, informed CSW to continue to fax for snf per MD.  07/16/11 16:12 Letha Cape RN, BSN [phone] patient lives with spouse, await pt/ot eval, Possible for ? snf, CSW aware of possiblity.

## 2011-07-27 ENCOUNTER — Ambulatory Visit: Payer: Medicare Other | Admitting: Endocrinology

## 2011-07-31 ENCOUNTER — Encounter: Payer: Medicare Other | Admitting: Endocrinology

## 2011-09-11 ENCOUNTER — Telehealth: Payer: Self-pay

## 2011-09-11 NOTE — Telephone Encounter (Signed)
HHRN called stating pt was discharged today from rehab on Novolog sliding scale but she was previously on Levemir 85 units qam. HHRN is requesting to have pt switched back to Levemir, spouse has already given pt Levemir 85 units this morning. Please advise in Dr George Hugh absence, thanks!

## 2011-09-11 NOTE — Telephone Encounter (Signed)
Needs OV with Dr Everardo All before med change of this kind

## 2011-09-11 NOTE — Telephone Encounter (Signed)
Called and spoke to the husband.  He stated they were not instructed on how to use the Novolog, please advise

## 2011-09-12 NOTE — Telephone Encounter (Signed)
The d/c summary states: insulin aspart 100 UNIT/ML injection    Commonly known as: novoLOG    0-9 Units, Subcutaneous, 3 times daily with meals  CBG < 70: implement hypoglycemia protocol  CBG 70 - 120: 0 units  CBG 121 - 150: 1 unit  CBG 151 - 200: 2 units  CBG 201 - 250: 3 units  CBG 251 - 300: 5 units  CBG 301 - 350: 7 units  CBG 351 - 400: 9 units  CBG > 400: call MD

## 2011-09-17 ENCOUNTER — Telehealth: Payer: Self-pay

## 2011-09-17 NOTE — Telephone Encounter (Signed)
Social Worker called requesting a verbal authorization for an additional visit with patient, please advise.

## 2011-09-17 NOTE — Telephone Encounter (Signed)
ok 

## 2011-09-17 NOTE — Telephone Encounter (Signed)
[place] Social Worker informed of MD's advisement for verbal order via VM and to callback office with any questions/concerns.

## 2011-09-20 ENCOUNTER — Encounter: Payer: Self-pay | Admitting: Endocrinology

## 2011-09-20 ENCOUNTER — Ambulatory Visit (INDEPENDENT_AMBULATORY_CARE_PROVIDER_SITE_OTHER): Payer: Medicare Other | Admitting: Endocrinology

## 2011-09-20 VITALS — BP 130/88 | HR 100 | Temp 98.4°F | Wt 213.0 lb

## 2011-09-20 DIAGNOSIS — K7460 Unspecified cirrhosis of liver: Secondary | ICD-10-CM | POA: Insufficient documentation

## 2011-09-20 MED ORDER — FUROSEMIDE 40 MG PO TABS: 40.0000 mg | ORAL_TABLET | Freq: Every day | ORAL | Status: DC

## 2011-09-20 MED ORDER — INSULIN NPH (HUMAN) (ISOPHANE) 100 UNIT/ML ~~LOC~~ SUSP: 85.0000 [IU] | SUBCUTANEOUS | Status: DC

## 2011-09-20 NOTE — Progress Notes (Signed)
Subjective:    Patient ID: Shannon Charles, female    DOB: 20-Mar-1928, 78 y.o.   MRN: [id number]  HPI The state of at least three ongoing medical problems is addressed today: DM: Pt is back on her outpt regimen, and cbg's are well-controlled. Edema is worse Dementia: Memory is improved, but not back to baseline. Past Medical History  Diagnosis Date  . DEMENTIA 08/23/2006  . DEPRESSION 08/23/2006  . DIABETES MELLITUS, TYPE I 08/23/2006  . HYPERLIPIDEMIA 08/23/2006  . HYPERTENSION 08/23/2006  . HYPOTHYROIDISM 08/23/2006  . OSTEOARTHRITIS 08/23/2006  . RENAL INSUFFICIENCY 08/23/2006  . Personal history of colonic polyps 04/29/2007  . Lung disease, restrictive     Mild, by pulmonary function test in October 2006 with mildly reduce DLCO  . Pain in limb   . Chronic pain syndrome   . Lack of coordination   . Lumbago   . Primary localized osteoarthrosis, lower leg   . Disorders of bursae and tendons in shoulder region, unspecified     Past Surgical History  Procedure Date  . Total abdominal hysterectomy   . Colonoscopy w/ polypectomy     History   Social History  . Marital Status: Married    Spouse Name: N/A    Number of Children: N/A  . Years of Education: N/A   Occupational History  . Not on file.   Social History Main Topics  . Smoking status: Never Smoker   . Smokeless tobacco: Not on file  . Alcohol Use: No  . Drug Use: No  . Sexually Active: No   Other Topics Concern  . 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 amitriptyline (ELAVIL) 10 MG tablet 10 mg at bedtime.       Marland Kitchen aspirin 81 MG tablet Take 81 mg by mouth daily.      Marland Kitchen donepezil (ARICEPT) 10 MG tablet Take 10 mg by mouth daily.        Marland Kitchen levothyroxine (SYNTHROID, LEVOTHROID) 175 MCG tablet Take 1 tablet (175 mcg total) by mouth daily.      . memantine (NAMENDA) 10 MG  tablet Take 10 mg by mouth 2 (two) times daily.      . sertraline (ZOLOFT) 50 MG tablet Take 50 mg by mouth 2 (two) times daily.       . simvastatin (ZOCOR) 40 MG tablet Take 40 mg by mouth every evening.      . latanoprost (XALATAN) 0.005 % ophthalmic solution Place 1 drop into both eyes at bedtime.      Marland Kitchen LORazepam (ATIVAN) 0.5 MG tablet Take 1 tablet (0.5 mg total) by mouth 2 (two) times daily as needed. For anxiety  10 tablet  0   No Known Allergies  Family History  Problem Relation Age of Onset  . Cancer Sister     uncertain type  . Cirrhosis Mother    BP 130/88  Pulse 100  Temp 98.4 F (36.9 C) (Oral)  Wt 213 lb (96.616 kg)  SpO2 95%  Review of Systems She has doe    Objective:   Physical Exam VITAL SIGNS:  See vs page GENERAL: no distress Ext: 2+ bilat leg edema     Assessment & Plan:  Edema, worse now DM.  She needs to resume her outpt regimen. Dementia, exac by recent illness, improving now.  Cirrhosis,  new.

## 2011-09-20 NOTE — Patient Instructions (Addendum)
Refer to a gastroenterology specialist.  you will receive a phone call, about a day and time for an appointment Resume furosemide at 40 mg daily.  i have sent a prescription to your pharmacy.   Resume nph pen, 85 units each morning.   Please come back for a follow-up appointment in 2 weeks.

## 2011-09-26 ENCOUNTER — Telehealth: Payer: Self-pay | Admitting: *Deleted

## 2011-09-26 NOTE — Telephone Encounter (Signed)
Called [facility] Case Management to refer pt as requested by Dr. Everardo All.

## 2011-10-02 ENCOUNTER — Telehealth: Payer: Self-pay | Admitting: *Deleted

## 2011-10-02 ENCOUNTER — Telehealth: Payer: Self-pay

## 2011-10-02 NOTE — Telephone Encounter (Signed)
Patients granddaughter  called Shannon Charles  [phone]...requesting a list of her mothers Rx's that are on file for her, states she also has POA.

## 2011-10-02 NOTE — Telephone Encounter (Signed)
ok 

## 2011-10-02 NOTE — Telephone Encounter (Signed)
Left message for pt's granddaughter informing her we have copy of current medication list ready for pickup.

## 2011-10-02 NOTE — Telephone Encounter (Signed)
RN called to inform MD that pt's discharge will be delayed due to hypoglycemia event this weekend (cbg 32 and EMS was called out). HHRN is requesting verbal authorization to extend services.

## 2011-10-03 ENCOUNTER — Telehealth: Payer: Self-pay | Admitting: *Deleted

## 2011-10-03 NOTE — Telephone Encounter (Signed)
Shannon Charles OT called to get verbal order to extend OT for 2xweek/4 weeks-okay for verbal order?

## 2011-10-03 NOTE — Telephone Encounter (Signed)
HHRN informed of verbal order via VM and to callback office with any questions/concerns.

## 2011-10-03 NOTE — Telephone Encounter (Signed)
ok 

## 2011-10-04 ENCOUNTER — Ambulatory Visit: Payer: Medicare Other | Admitting: Endocrinology

## 2011-10-04 NOTE — Telephone Encounter (Signed)
Shannon Charles OT informed of verbal order on VM and to callback office with any questions/concerns.

## 2011-11-07 ENCOUNTER — Encounter (HOSPITAL_COMMUNITY): Payer: Self-pay

## 2011-11-07 ENCOUNTER — Ambulatory Visit (INDEPENDENT_AMBULATORY_CARE_PROVIDER_SITE_OTHER): Payer: Medicare Other | Admitting: Internal Medicine

## 2011-11-07 ENCOUNTER — Emergency Department (HOSPITAL_COMMUNITY): Payer: Medicare Other

## 2011-11-07 ENCOUNTER — Encounter
Payer: Medicare Other | Attending: Physical Medicine and Rehabilitation | Admitting: Physical Medicine and Rehabilitation

## 2011-11-07 ENCOUNTER — Emergency Department (HOSPITAL_COMMUNITY)
Admission: EM | Admit: 2011-11-07 | Discharge: 2011-11-07 | Disposition: A | Discharge status: Home / Self Care | Payer: Medicare Other | Attending: Emergency Medicine | Admitting: Emergency Medicine

## 2011-11-07 ENCOUNTER — Other Ambulatory Visit: Payer: Self-pay | Admitting: Physical Medicine and Rehabilitation

## 2011-11-07 ENCOUNTER — Encounter: Payer: Self-pay | Admitting: Physical Medicine and Rehabilitation

## 2011-11-07 ENCOUNTER — Encounter: Payer: Self-pay | Admitting: Internal Medicine

## 2011-11-07 VITALS — BP 160/92 | HR 90 | Temp 97.0°F | Resp 16 | Wt 207.5 lb

## 2011-11-07 VITALS — BP 176/71 | HR 92 | Resp 16 | Ht 62.0 in | Wt 209.0 lb

## 2011-11-07 DIAGNOSIS — E109 Type 1 diabetes mellitus without complications: Secondary | ICD-10-CM | POA: Insufficient documentation

## 2011-11-07 DIAGNOSIS — R609 Edema, unspecified: Secondary | ICD-10-CM | POA: Insufficient documentation

## 2011-11-07 DIAGNOSIS — M25569 Pain in unspecified knee: Secondary | ICD-10-CM | POA: Insufficient documentation

## 2011-11-07 DIAGNOSIS — M545 Low back pain, unspecified: Secondary | ICD-10-CM | POA: Insufficient documentation

## 2011-11-07 DIAGNOSIS — M542 Cervicalgia: Secondary | ICD-10-CM | POA: Insufficient documentation

## 2011-11-07 DIAGNOSIS — J811 Chronic pulmonary edema: Secondary | ICD-10-CM | POA: Insufficient documentation

## 2011-11-07 DIAGNOSIS — F0390 Unspecified dementia without behavioral disturbance: Secondary | ICD-10-CM | POA: Insufficient documentation

## 2011-11-07 DIAGNOSIS — E785 Hyperlipidemia, unspecified: Secondary | ICD-10-CM | POA: Insufficient documentation

## 2011-11-07 DIAGNOSIS — Z7982 Long term (current) use of aspirin: Secondary | ICD-10-CM | POA: Insufficient documentation

## 2011-11-07 DIAGNOSIS — R0609 Other forms of dyspnea: Secondary | ICD-10-CM | POA: Insufficient documentation

## 2011-11-07 DIAGNOSIS — R279 Unspecified lack of coordination: Secondary | ICD-10-CM | POA: Insufficient documentation

## 2011-11-07 DIAGNOSIS — G894 Chronic pain syndrome: Secondary | ICD-10-CM | POA: Insufficient documentation

## 2011-11-07 DIAGNOSIS — E039 Hypothyroidism, unspecified: Secondary | ICD-10-CM | POA: Insufficient documentation

## 2011-11-07 DIAGNOSIS — R0600 Dyspnea, unspecified: Secondary | ICD-10-CM

## 2011-11-07 DIAGNOSIS — I10 Essential (primary) hypertension: Secondary | ICD-10-CM | POA: Insufficient documentation

## 2011-11-07 DIAGNOSIS — R0989 Other specified symptoms and signs involving the circulatory and respiratory systems: Secondary | ICD-10-CM | POA: Insufficient documentation

## 2011-11-07 DIAGNOSIS — Z79899 Other long term (current) drug therapy: Secondary | ICD-10-CM | POA: Insufficient documentation

## 2011-11-07 DIAGNOSIS — M25529 Pain in unspecified elbow: Secondary | ICD-10-CM | POA: Insufficient documentation

## 2011-11-07 LAB — COMPREHENSIVE METABOLIC PANEL
ALT: 15 U/L (ref 0–35)
AST: 46 U/L — ABNORMAL HIGH (ref 0–37)
CO2: 28 mEq/L (ref 19–32)
Chloride: 105 mEq/L (ref 96–112)
GFR calc non Af Amer: 59 mL/min — ABNORMAL LOW (ref >90)
Sodium: 143 mEq/L (ref 135–145)
Total Bilirubin: 0.6 mg/dL (ref 0.3–1.2)

## 2011-11-07 LAB — CBC WITH DIFFERENTIAL/PLATELET
Basophils Absolute: 0 10*3/uL (ref 0.0–0.1)
HCT: 34.4 % — ABNORMAL LOW (ref 36.0–46.0)
Lymphocytes Relative: 13 % (ref 12–46)
Neutro Abs: 6.6 10*3/uL (ref 1.7–7.7)
Platelets: 196 10*3/uL (ref 150–400)
RDW: 18.6 % — ABNORMAL HIGH (ref 11.5–15.5)
WBC: 8.8 10*3/uL (ref 4.0–10.5)

## 2011-11-07 MED ORDER — OXYCODONE-ACETAMINOPHEN 5-325 MG PO TABS: 1.0000 | ORAL_TABLET | Freq: Three times a day (TID) | ORAL | Status: DC | PRN

## 2011-11-07 MED ORDER — FUROSEMIDE 40 MG PO TABS: 40.0000 mg | ORAL_TABLET | Freq: Two times a day (BID) | ORAL | Status: DC

## 2011-11-07 MED ORDER — OXYCODONE-ACETAMINOPHEN 5-325 MG PO TABS: 1.0000 | ORAL_TABLET | Freq: Three times a day (TID) | ORAL | Status: AC | PRN

## 2011-11-07 NOTE — Progress Notes (Signed)
Subjective:    Patient ID: Shannon Charles, female    DOB: [birth year], 78 y.o.   MRN: [id number]  HPI  [name] is a very pleasant 78 year old woman who is accompanied  by her grandson today. She is back in today and her average pain score is an 8 on a scale of 10.   She has occasional back achiness and her knees bother her when she  transitions from sitting to standing and walks.    Her pain currently has been interfering very little with her activities.  Her sleep is fair.   Pain is worse when she is up walking or does prolonged sitting.   She gets some fair relief with current meds. Needs med refill today.  07/14/2011-07/20/2011  Hospitaliztion for increased confusion and weakness, toxic metabolic encephalopathy,acute renal failure, UTI,hypothyroidism,dysphagia,uncontrolled DM,persitant elevation in transaminase. No acute findings on head CT.  Grandson reports husband gives her Naproxen everyday.  Chief complaint is "tiredness"  "Whole body feels tired"  Complains of pain in B knees, L elbow, neck.    Pain Inventory Average Pain 8 Pain Right Now 8 My pain is intermittent  In the last 24 hours, has pain interfered with the following? General activity 7 Relation with others 7 Enjoyment of life 7 What TIME of day is your pain at its worst? daytime Sleep (in general) Fair  Pain is worse with: walking, bending, sitting, inactivity, standing and some activites Pain improves with: pacing activities Relief from Meds: 3  Mobility use a walker ability to climb steps?  yes do you drive?  no needs help with transfers Do you have any goals in this area?  yes  Function retired Do you have any goals in this area?  yes  Neuro/Psych bladder control problems  Prior Studies Any changes since last visit?  no  Physicians involved in your care Any changes since last visit?  no   Family History  Problem Relation Age of Onset  . Cancer Sister     uncertain  type  . Cirrhosis Mother    History   Social History  . Marital Status: Married    Spouse Name: N/A    Number of Children: N/A  . Years of Education: N/A   Social History Main Topics  . Smoking status: Never Smoker   . Smokeless tobacco: None  . Alcohol Use: No  . Drug Use: No  . Sexually Active: No   Other Topics Concern  . None   Social History Narrative   High School GraduateHusb is now in nursing home   Past Surgical History  Procedure Date  . Total abdominal hysterectomy   . Colonoscopy w/ polypectomy    Past Medical History  Diagnosis Date  . DEMENTIA 08/23/2006  . DEPRESSION 08/23/2006  . DIABETES MELLITUS, TYPE I 08/23/2006  . HYPERLIPIDEMIA 08/23/2006  . HYPERTENSION 08/23/2006  . HYPOTHYROIDISM 08/23/2006  . OSTEOARTHRITIS 08/23/2006  . RENAL INSUFFICIENCY 08/23/2006  . Personal history of colonic polyps 04/29/2007  . Lung disease, restrictive     Mild, by pulmonary function test in October 2006 with mildly reduce DLCO  . Pain in limb   . Chronic pain syndrome   . Lack of coordination   . Lumbago   . Primary localized osteoarthrosis, lower leg   . Disorders of bursae and tendons in shoulder region, unspecified    BP 176/71  Pulse 92  Resp 16  Ht 5\' 2"  (1.575 m)  Wt 209 lb (94.802 kg)  BMI  38.23 kg/m2  SpO2 95%     Review of Systems  Genitourinary: Positive for frequency.  Musculoskeletal: Positive for myalgias, back pain, arthralgias and gait problem.  All other systems reviewed and are negative.       Objective:   Physical Exam  She is oriented x2.  Tuesday January 06, 2012.   Obama.   Speech is clear.   Her affect is tired.     She is alert and cooperative. Follows commands without  difficulty. Answers my questions appropriately.   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  Coordination is grossly intact.  Her reflexes are diminished in the  lower extremities.  There is no abnormal tone, clonus, or tremors.   She has good strength at hip  flexors, knee extensors, dorsiflexors, and  plantar flexors.   Her hip extensors are slightly weak and she has some  difficulty exiting a chair, although she does have some knee pain as she  exits as well.   Her gait is slightly mildly unstable.   She does use a walker for  community distances and a cane in the home.  Mild limitations in cervical range of motion without aggravation of sx   Flexion and extension of the lumbar spine does not aggravate pain.  Flexion and extension at the knees, she does report some mild  discomfort.   She has crepitus in both knees bilaterally without  effusion.   Examining her right upper extremity, her shoulder has very good range of motion. She is able to abduct the shoulder to 180  degrees without discomfort.   Bilateral lower extremity pitting edema.       Assessment & Plan:  1.Cervicalgia   2. Lumbago, currently not a major problem at this time.    3. Bilateral knee pain. This does interfere with transferring from  sitting to standing and also bothers her when she is sitting for a  prolonged period of time or up walking.    4. Mild balance disorder, unchanged.    PLAN:    Will refill Percocet, check UDS, remind family to bring in narcotic bottles for pill counts.  She is off gabapentin.   Husband apparently been giving her daily naproxen.  Discussed with grandson to discontinue this over the counter medicine.   Would like her to follow up with Dr. Everardo All, for SOB, LE edema, fatigue.  Grandson reports worsening of sx over last couple of weeks. Nurse scheduled appt for 2:30pm today with Dr. Yetta Barre at [facility]   I will refill her Percocet, she takes 5/325 tablet, a half a pill in the morning, at 1  p.m. and 6 p.m. and a full pill at bedtime.   She has been taking her medications as prescribed. No evidence of aberrant behavior is  observed. She reports fair relief with the medications.  Her narcotic pill  count is appropriate. Her last urine drug screen was October 25, 2010, and was consistent.   Repeat UDS today, result pending.   I have answered all their questions.  They are comfortable with the plan.  I will see them back next month for refill of  medications and brief recheck.    Echart note: Comments: Pt signed a Administrator, Civil Service to allow her grandson and husband, Angela Nevin and Sharni Negron, to have access to her medical records/information. 06/22/2009

## 2011-11-07 NOTE — Progress Notes (Signed)
Subjective:    Patient ID: Shannon Charles, female    DOB: 1928-01-12, 78 y.o.   MRN: [id number]  HPI  New to me she comes in today c/o one week history of fatigue, SOB, and edema. She is a poor historian but her grandson is with her and gives me most of the details.  Review of Systems  Constitutional: Positive for fatigue.  HENT: Negative.   Eyes: Negative.   Respiratory: Positive for shortness of breath. Negative for cough, chest tightness, wheezing and stridor.   Cardiovascular: Positive for leg swelling. Negative for chest pain and palpitations.  Gastrointestinal: Positive for abdominal distention. Negative for nausea, vomiting, abdominal pain, diarrhea, blood in stool, anal bleeding and rectal pain.  Genitourinary: Negative.   Musculoskeletal: Negative.   Neurological: Negative.   Hematological: Negative for adenopathy. Does not bruise/bleed easily.  Psychiatric/Behavioral: Negative.        Objective:   Physical Exam  Constitutional: She appears well-developed.  Non-toxic appearance. She does not have a sickly appearance. She does not appear ill. She appears distressed.  HENT:  Head: Normocephalic and atraumatic.  Mouth/Throat: Oropharynx is clear and moist. No oropharyngeal exudate.  Eyes: Conjunctivae are normal. Right eye exhibits no discharge. Left eye exhibits no discharge. No scleral icterus.  Neck: Normal range of motion. Neck supple. No JVD present. No tracheal deviation present. No thyromegaly present.  Cardiovascular: Normal rate, regular rhythm, S1 normal, S2 normal and intact distal pulses.  Exam reveals gallop and S4. Exam reveals no friction rub.   No murmur heard. Pulmonary/Chest: Accessory muscle usage present. No stridor. Tachypnea noted. She is in respiratory distress. She has no wheezes. She has no rhonchi. She has rales in the right upper field, the right middle field, the right lower field, the left upper field, the left middle field and the left lower field.    Abdominal: Soft. Bowel sounds are normal. She exhibits distension. She exhibits no mass. There is no tenderness. There is no rebound.  Musculoskeletal: Normal range of motion. She exhibits edema (3++ pitting edema in BLE). She exhibits no tenderness.  Lymphadenopathy:    She has no cervical adenopathy.  Skin: Skin is warm and dry. No rash noted. She is not diaphoretic. No erythema. No pallor.  Psychiatric: Her mood appears anxious. Her affect is inappropriate. Her affect is not angry and not labile. Her speech is delayed. Her speech is not rapid and/or pressured and not slurred. She is slowed and withdrawn. She is not agitated, not aggressive, is not hyperactive, not actively hallucinating and not combative. Cognition and memory are impaired. She does not express impulsivity or inappropriate judgment. She does not exhibit a depressed mood. She is noncommunicative. She exhibits abnormal recent memory and abnormal remote memory. She is inattentive.      Lab Results  Component Value Date   WBC 7.9 07/15/2011   HGB 11.1* 07/15/2011   HCT 34.9* 07/15/2011   PLT 180 07/15/2011   GLUCOSE 121* 07/20/2011   CHOL 176 12/12/2010   TRIG 106 12/12/2010   HDL 71 12/12/2010   LDLDIRECT 174.8 12/22/2009   LDLCALC 84 12/12/2010   ALT 17 07/15/2011   AST 34 07/15/2011   NA 145 07/20/2011   K 3.6 07/20/2011   CL 110 07/20/2011   CREATININE 1.36* 07/20/2011   BUN 42* 07/20/2011   CO2 24 07/20/2011   TSH 0.151* 07/14/2011   INR 1.08 12/11/2010   HGBA1C 9.1* 07/14/2011   MICROALBUR 3.4* 07/20/2010  Assessment & Plan:

## 2011-11-07 NOTE — ED Notes (Signed)
Patient to ED for severe swelling of her lower extremities.  States that Dr. Everardo All is her MD, but she was seen today by Dr Yetta Barre.  States that she has CHF. 3+ edema of bilateral LE noted.  Patient C/O dyspnea at rest.  Heart sounds WDL. Breath sounds distant.  Placed on oxygen at 1 L/M per cannula

## 2011-11-07 NOTE — Assessment & Plan Note (Signed)
She needs urgent evaluation and treatment so her grandson will take her to the ED at [facility]

## 2011-11-07 NOTE — ED Notes (Signed)
Pt reports she is unsure why she is here, (L) side facial droop noted, pt reports SOB. Pt seen at her pcp and sent to [facility] ED to r/o pulmonary edema. Per grandson pt has a hx of Bells Palsy, pt reports she has noticed the "droop" for a while. Grandson reports BLE swelling, pt takes Lasix.

## 2011-11-07 NOTE — ED Notes (Signed)
SaO2 96% while ambulating

## 2011-11-07 NOTE — ED Provider Notes (Addendum)
History     CSN: [id number]  Arrival date & time 11/07/11  1457   First MD Initiated Contact with Patient 11/07/11 1830      Chief Complaint  Patient presents with  . Weakness    (Consider location/radiation/quality/duration/timing/severity/associated sxs/prior treatment) HPI - Limited by dementia Pt brought to the ED from local PCP office with husband and granddaughter who provide much of the history. Pt has had increasing LE edema and weight gain for the last several days. She was apparently having some SOB today and so went to the PCP office. Sent from there for evaluation of possible pulmonary edema. She is taking lasix for her LE edema but no known CHF or CAD. She has not had a cough or fever.   Past Medical History  Diagnosis Date  . DEMENTIA 08/23/2006  . DEPRESSION 08/23/2006  . DIABETES MELLITUS, TYPE I 08/23/2006  . HYPERLIPIDEMIA 08/23/2006  . HYPERTENSION 08/23/2006  . HYPOTHYROIDISM 08/23/2006  . OSTEOARTHRITIS 08/23/2006  . RENAL INSUFFICIENCY 08/23/2006  . Personal history of colonic polyps 04/29/2007  . Lung disease, restrictive     Mild, by pulmonary function test in October 2006 with mildly reduce DLCO  . Pain in limb   . Chronic pain syndrome   . Lack of coordination   . Lumbago   . Primary localized osteoarthrosis, lower leg   . Disorders of bursae and tendons in shoulder region, unspecified     Past Surgical History  Procedure Date  . Total abdominal hysterectomy   . Colonoscopy w/ polypectomy     Family History  Problem Relation Age of Onset  . Cancer Sister     uncertain type  . Cirrhosis Mother     History  Substance Use Topics  . Smoking status: Never Smoker   . Smokeless tobacco: Not on file  . Alcohol Use: No    OB History    Grav Para Term Preterm Abortions TAB SAB Ect Mult Living                  Review of Systems Unable to assess due to mental status.   Allergies  Review of patient's allergies indicates no known  allergies.  Home Medications   Current Outpatient Rx  Name Route Sig Dispense Refill  . AMITRIPTYLINE HCL 10 MG PO TABS  10 mg at bedtime.     . ASPIRIN 81 MG PO TABS Oral Take 81 mg by mouth daily.    . DONEPEZIL HCL 10 MG PO TABS Oral Take 10 mg by mouth daily.      . FUROSEMIDE 40 MG PO TABS Oral Take 1 tablet (40 mg total) by mouth daily. 90 tablet 3  . INSULIN ISOPHANE HUMAN 100 UNIT/ML [place] SUSP Subcutaneous Inject 85 Units into the skin every morning. 90 mL 12  . LATANOPROST 0.005 % OP SOLN Both Eyes Place 1 drop into both eyes at bedtime.    Marland Kitchen LEVOTHYROXINE SODIUM 175 MCG PO TABS Oral Take 1 tablet (175 mcg total) by mouth daily.    Marland Kitchen LORAZEPAM 0.5 MG PO TABS Oral Take 1 tablet (0.5 mg total) by mouth 2 (two) times daily as needed. For anxiety 10 tablet 0  . MEMANTINE HCL 10 MG PO TABS Oral Take 10 mg by mouth 2 (two) times daily.    . OXYCODONE HCL 5 MG PO TABS Oral Take 5 mg by mouth 4 (four) times daily. 1/2 tablet three times daily and 1 tablet at bedtime    .  OXYCODONE-ACETAMINOPHEN 5-325 MG PO TABS Oral Take 1 tablet by mouth every 8 (eight) hours as needed for pain. 90 tablet 0  . SERTRALINE HCL 50 MG PO TABS Oral Take 50 mg by mouth 2 (two) times daily.     Marland Kitchen SIMVASTATIN 40 MG PO TABS Oral Take 40 mg by mouth every evening.      BP 160/68  Pulse 85  Temp 98.5 F (36.9 C)  Resp 20  SpO2 96%  Physical Exam  Nursing note and vitals reviewed. Constitutional: She appears well-developed and well-nourished.  HENT:  Head: Normocephalic and atraumatic.  Eyes: EOM are normal. Pupils are equal, round, and reactive to light.  Neck: Normal range of motion. Neck supple.  Cardiovascular: Normal rate, normal heart sounds and intact distal pulses.   Pulmonary/Chest: Effort normal and breath sounds normal. No respiratory distress.       Tachypneic, but no distress and no increased WOB  Abdominal: Bowel sounds are normal. She exhibits no distension. There is no tenderness.   Musculoskeletal: Normal range of motion. She exhibits edema. She exhibits no tenderness.  Neurological: She is alert. She has normal strength. No cranial nerve deficit or sensory deficit.  Skin: Skin is warm and dry. No rash noted.  Psychiatric: She has a normal mood and affect.    ED Course  Procedures (including critical care time)  Labs Reviewed  CBC WITH DIFFERENTIAL - Abnormal; Notable for the following:    RBC 3.80 (*)     Hemoglobin 10.7 (*)     HCT 34.4 (*)     RDW 18.6 (*)     All other components within normal limits  COMPREHENSIVE METABOLIC PANEL - Abnormal; Notable for the following:    Albumin 2.9 (*)     AST 46 (*)     Alkaline Phosphatase 121 (*)     GFR calc non Af Amer 59 (*)     GFR calc Af Amer 69 (*)     All other components within normal limits  GLUCOSE, CAPILLARY - Abnormal; Notable for the following:    Glucose-Capillary 141 (*)     All other components within normal limits  PRO B NATRIURETIC PEPTIDE  POCT I-STAT TROPONIN I   Dg Chest 1 View  11/07/2011  *RADIOLOGY REPORT*  Clinical Data: 78 year old female with weakness and shortness of breath.  CHEST - 1 VIEW  Comparison: 07/14/2011 and earlier.  Findings: Continued low lung volumes.  Chronic elevation of the right hemidiaphragm. Stable cardiomegaly and mediastinal contours. Visualized tracheal air column is within normal limits.  Calcified right hilar lymph node is stable.  No pneumothorax.  No pulmonary edema.  No definite pleural effusion.  Crowding of markings and mild streaky bibasilar atelectasis.  IMPRESSION: Atelectasis, otherwise no acute cardiopulmonary abnormality.  Original Report Authenticated By: Harley Hallmark, M.D.     No diagnosis found.    MDM  Labs and imaging ordered by Triage nurse are unremarkable. No concern for pulmonary edema. She is able to ambulate without hypoxia on room air. Discussed inpatient vs outpatient evaluation. Pt does not want to stay. Family is comfortable going  home. Advised to increase Lasix to BID, renal function and K today are normal.     Date: 12/11/2011  Rate: 87  Rhythm: normal sinus rhythm  QRS Axis: normal  Intervals: normal  ST/T Wave abnormalities: normal  Conduction Disutrbances: none  Narrative Interpretation: unremarkable         Charles B. Bernette Mayers, MD 11/07/11 1950  Charles B. Bernette Mayers, MD 12/11/11 1314

## 2011-11-07 NOTE — Patient Instructions (Addendum)
Do not use over the counter naproxen.  Follow up with primary care physician.  I have refilled your percocet one tablet 5/325 two to three times per day.  Use your walker to prevent falls.

## 2011-11-09 ENCOUNTER — Encounter: Payer: Self-pay | Admitting: Physical Medicine and Rehabilitation

## 2011-11-09 LAB — DRUGS OF ABUSE SCREEN W/O ALC, ROUTINE URINE
Amphetamine Screen, Ur: NEGATIVE
Benzodiazepines.: NEGATIVE
Cocaine Metabolites: NEGATIVE
Marijuana Metabolite: NEGATIVE
Phencyclidine (PCP): NEGATIVE

## 2011-11-23 ENCOUNTER — Telehealth: Payer: Self-pay | Admitting: Physical Medicine & Rehabilitation

## 2011-11-23 NOTE — Telephone Encounter (Signed)
This is Dr. Leretha Dykes pt.

## 2011-11-23 NOTE — Telephone Encounter (Signed)
Message copied by Ranelle Oyster on Fri Nov 23, 2011 12:21 PM ------      Message from: Su Monks      Created: Tue Nov 20, 2011  8:46 AM       Patient was negative for oxycodone, prescribed 5mg  1/2 a tablet tid, 1 tablet hs; stated that she had taken a tablet before the test, what do you want me to do

## 2011-11-27 ENCOUNTER — Encounter: Payer: Self-pay | Admitting: Gastroenterology

## 2011-11-30 NOTE — Telephone Encounter (Signed)
Ok, thanks.

## 2011-11-30 NOTE — Telephone Encounter (Signed)
Shannon Rave,  Ms Smiths meds are managed by her family.  She has some memory problems and is not reliable regarding what she has taken.  She also was recently hospitalized, i am not surprised that her UDS was inconsistant. She had not been on her usual doses.  We should recheck UDS in the next month of 2 again.

## 2011-12-05 ENCOUNTER — Encounter: Payer: Self-pay | Admitting: Physical Medicine and Rehabilitation

## 2011-12-05 ENCOUNTER — Ambulatory Visit (INDEPENDENT_AMBULATORY_CARE_PROVIDER_SITE_OTHER): Payer: Medicare Other | Admitting: Internal Medicine

## 2011-12-05 ENCOUNTER — Encounter
Payer: Medicare Other | Attending: Physical Medicine and Rehabilitation | Admitting: Physical Medicine and Rehabilitation

## 2011-12-05 ENCOUNTER — Ambulatory Visit (INDEPENDENT_AMBULATORY_CARE_PROVIDER_SITE_OTHER): Payer: Medicare Other

## 2011-12-05 VITALS — BP 164/77 | HR 101 | Resp 18 | Ht 65.0 in | Wt 207.0 lb

## 2011-12-05 VITALS — BP 166/80 | HR 109 | Temp 98.1°F | Resp 16

## 2011-12-05 DIAGNOSIS — I10 Essential (primary) hypertension: Secondary | ICD-10-CM | POA: Insufficient documentation

## 2011-12-05 DIAGNOSIS — M545 Low back pain, unspecified: Secondary | ICD-10-CM | POA: Insufficient documentation

## 2011-12-05 DIAGNOSIS — E039 Hypothyroidism, unspecified: Secondary | ICD-10-CM

## 2011-12-05 DIAGNOSIS — M25569 Pain in unspecified knee: Secondary | ICD-10-CM | POA: Insufficient documentation

## 2011-12-05 DIAGNOSIS — E109 Type 1 diabetes mellitus without complications: Secondary | ICD-10-CM | POA: Insufficient documentation

## 2011-12-05 DIAGNOSIS — E785 Hyperlipidemia, unspecified: Secondary | ICD-10-CM | POA: Insufficient documentation

## 2011-12-05 DIAGNOSIS — K7290 Hepatic failure, unspecified without coma: Secondary | ICD-10-CM

## 2011-12-05 DIAGNOSIS — M25529 Pain in unspecified elbow: Secondary | ICD-10-CM | POA: Insufficient documentation

## 2011-12-05 DIAGNOSIS — R0602 Shortness of breath: Secondary | ICD-10-CM

## 2011-12-05 DIAGNOSIS — N179 Acute kidney failure, unspecified: Secondary | ICD-10-CM

## 2011-12-05 DIAGNOSIS — F0390 Unspecified dementia without behavioral disturbance: Secondary | ICD-10-CM | POA: Insufficient documentation

## 2011-12-05 DIAGNOSIS — R279 Unspecified lack of coordination: Secondary | ICD-10-CM | POA: Insufficient documentation

## 2011-12-05 DIAGNOSIS — F068 Other specified mental disorders due to known physiological condition: Secondary | ICD-10-CM

## 2011-12-05 DIAGNOSIS — R609 Edema, unspecified: Secondary | ICD-10-CM

## 2011-12-05 DIAGNOSIS — R601 Generalized edema: Secondary | ICD-10-CM

## 2011-12-05 DIAGNOSIS — K7460 Unspecified cirrhosis of liver: Secondary | ICD-10-CM

## 2011-12-05 DIAGNOSIS — M542 Cervicalgia: Secondary | ICD-10-CM | POA: Insufficient documentation

## 2011-12-05 LAB — URINALYSIS, ROUTINE W REFLEX MICROSCOPIC
Bilirubin Urine: NEGATIVE
Nitrite: POSITIVE
pH: 6 (ref 5.0–8.0)

## 2011-12-05 LAB — CBC WITH DIFFERENTIAL/PLATELET
Basophils Absolute: 0 K/uL (ref 0.0–0.1)
Basophils Relative: 0.3 % (ref 0.0–3.0)
Eosinophils Absolute: 0.1 K/uL (ref 0.0–0.7)
Eosinophils Relative: 1.4 % (ref 0.0–5.0)
HCT: 32.1 % — ABNORMAL LOW (ref 36.0–46.0)
Hemoglobin: 10.2 g/dL — ABNORMAL LOW (ref 12.0–15.0)
Lymphocytes Relative: 9.4 % — ABNORMAL LOW (ref 12.0–46.0)
Lymphs Abs: 0.8 K/uL (ref 0.7–4.0)
MCHC: 31.7 g/dL (ref 30.0–36.0)
MCV: 90.7 fl (ref 78.0–100.0)
Monocytes Absolute: 0.8 K/uL (ref 0.1–1.0)
Monocytes Relative: 8.4 % (ref 3.0–12.0)
Neutro Abs: 7.3 K/uL (ref 1.4–7.7)
Neutrophils Relative %: 80.5 % — ABNORMAL HIGH (ref 43.0–77.0)
Platelets: 177 K/uL (ref 150.0–400.0)
RBC: 3.54 Mil/uL — ABNORMAL LOW (ref 3.87–5.11)
RDW: 20.2 % — ABNORMAL HIGH (ref 11.5–14.6)
WBC: 9.1 K/uL (ref 4.5–10.5)

## 2011-12-05 LAB — HEMOGLOBIN A1C: Hgb A1c MFr Bld: 6.3 % (ref 4.6–6.5)

## 2011-12-05 LAB — BRAIN NATRIURETIC PEPTIDE: Pro B Natriuretic peptide (BNP): 132 pg/mL — ABNORMAL HIGH (ref 0.0–100.0)

## 2011-12-05 MED ORDER — RIFAXIMIN 200 MG PO TABS: 200.0000 mg | ORAL_TABLET | Freq: Two times a day (BID) | ORAL | Status: AC

## 2011-12-05 MED ORDER — OXYCODONE HCL 5 MG PO TABS: 5.0000 mg | ORAL_TABLET | Freq: Three times a day (TID) | ORAL | Status: AC | PRN

## 2011-12-05 MED ORDER — FUROSEMIDE 40 MG PO TABS: 40.0000 mg | ORAL_TABLET | Freq: Three times a day (TID) | ORAL | Status: DC

## 2011-12-05 NOTE — Progress Notes (Signed)
Subjective:    Patient ID: Shannon Charles, female    DOB: [birth year], 78 y.o.   MRN: [id number]  HPI  Shannon Charles is a very pleasant 78 year old woman who is accompanied  by her grandson today.  She is back in today and her average pain score is an 8 on a scale of 10.  She has occasional back achiness and her knees bother her when she  transitions from sitting to standing and walks.         She gets some fair relief with current meds. Needs med refill today.   07/14/2011-07/20/2011  Hospitaliztion for increased confusion and weakness, toxic metabolic encephalopathy,acute renal failure, UTI,hypothyroidism,dysphagia,uncontrolled DM,persitant elevation in transaminase.  No acute findings on head CT.   Patient is short of breath today and complaints of mid back pain.   Complains of pain in B knees, L elbow, neck.      Pain Inventory Average Pain 10 Pain Right Now 10 My pain is sharp  In the last 24 hours, has pain interfered with the following? General activity 0 Relation with others 0 Enjoyment of life 0 What TIME of day is your pain at its worst? All Day Sleep (in general) Fair  Pain is worse with: walking, bending, sitting, inactivity, standing and some activites Pain improves with: Nothing Relief from Meds: 0  Mobility use a walker ability to climb steps?  no do you drive?  no  Function retired I need assistance with the following:  dressing, bathing, toileting, meal prep, household duties and shopping  Neuro/Psych bladder control problems weakness trouble walking dizziness confusion depression anxiety  Prior Studies Any changes since last visit?  no  Physicians involved in your care Any changes since last visit?  no   Family History  Problem Relation Age of Onset  . Cancer Sister     uncertain type  . Cirrhosis Mother    History   Social History  . Marital Status: Married    Spouse Name: N/A    Number of Children: N/A  . Years of  Education: N/A   Social History Main Topics  . Smoking status: Never Smoker   . Smokeless tobacco: None  . Alcohol Use: No  . Drug Use: No  . Sexually Active: No   Other Topics Concern  . None   Social History Narrative   High School GraduateHusb is now in nursing home   Past Surgical History  Procedure Date  . Total abdominal hysterectomy   . Colonoscopy w/ polypectomy    Past Medical History  Diagnosis Date  . DEMENTIA 08/23/2006  . DEPRESSION 08/23/2006  . DIABETES MELLITUS, TYPE I 08/23/2006  . HYPERLIPIDEMIA 08/23/2006  . HYPERTENSION 08/23/2006  . HYPOTHYROIDISM 08/23/2006  . OSTEOARTHRITIS 08/23/2006  . RENAL INSUFFICIENCY 08/23/2006  . Personal history of colonic polyps 04/29/2007  . Lung disease, restrictive     Mild, by pulmonary function test in October 2006 with mildly reduce DLCO  . Pain in limb   . Chronic pain syndrome   . Lack of coordination   . Lumbago   . Primary localized osteoarthrosis, lower leg   . Disorders of bursae and tendons in shoulder region, unspecified    BP 164/77  Pulse 101  Resp 18  Ht 5\' 5"  (1.651 m)  Wt 207 lb (93.895 kg)  BMI 34.45 kg/m2  SpO2 90%     Review of Systems  HENT: Negative.   Eyes: Negative.   Respiratory: Negative.   Cardiovascular: Negative.  Gastrointestinal: Negative.   Genitourinary: Negative.   Musculoskeletal: Positive for back pain and gait problem.  Skin: Negative.   Neurological: Positive for dizziness and weakness.  Psychiatric/Behavioral: Positive for confusion.       Depression/Anxiety       Objective:   Physical Exam  She is oriented x2. Trouble with date. Knows year 2013.  Short of breath last two weeks. No Chest pain.  Speech is clear.    She is alert and cooperative. Follows commands without  difficulty. Answers my questions appropriately.   H/o Bells palsy on right Coordination is grossly intact.  Her reflexes are diminished in the  lower extremities.  There is no abnormal tone,  clonus, or tremors.  She has good strength at hip flexors, knee extensors, dorsiflexors, and  plantar flexors.  Her hip extensors are slightly weak and she has some  difficulty exiting a chair, although she does have some knee pain as she  exits as well.  Her gait is slightly mildly unstable.  She does use a walker for  community distances and a cane in the home.   Mild limitations in cervical range of motion without aggravation of sx  Flexion and extension of the lumbar spine does not aggravate pain.  Flexion and extension at the knees, she does report some mild  discomfort.   She has crepitus in both knees bilaterally without  effusion.   Bilateral lower extremity pitting edema up to thighs.    *RADIOLOGY REPORT* 06/28/10 Clinical Data: Low back and bilateral hip and lower extremity pain.  MRI LUMBAR SPINE WITHOUT CONTRAST  Technique: Multiplanar and multiecho pulse sequences of the lumbar  spine were obtained without intravenous contrast.  Comparison: Plain films lumbar spine 01/30/2008.  Findings: Vertebral body height and alignment are maintained. No  worrisome marrow lesion is identified with a few small hemangiomas  seen and some degenerative discogenic marrow signal change  identified at L4-5. The conus medullaris is normal in signal and  position. Imaged intra-abdominal contents are unremarkable.  The T11-12 and T12-L1 levels are imaged in the sagittal plane only  and negative.  L1-2: Negative.  L2-3: Mild disc bulge. Central canal and foramina widely patent.  L3-4: Mild disc bulge and facet arthropathy. Central canal and  foramina are open.  L4-5: Mild disc bulging facet arthropathy. Central canal and  foramina are open.  L5-S1: Negative.  IMPRESSION:  Mild spondylosis of the lumbar spine without central canal or  foraminal narrowing.  Original Report Authenticated By: [id number]         Assessment & Plan:    SOB: pt will be seen by PCP after visit  today.  1.Cervicalgia   2. Lumbago, currently not a major problem at this time.   3. Bilateral knee pain. This does interfere with transferring from  sitting to standing and also bothers her when she is sitting for a  prolonged period of time or up walking.   4. Mild balance disorder, unchanged.   PLAN:  Will refill Percocet, check UDS next visit, remind family to bring in narcotic bottles for pill counts.   I will refill her Percocet, she takes 5/325 tablet, One PO tid. She has been taking her medications as prescribed. No evidence of aberrant behavior is  observed. She reports fair relief with the medications.     Her urine drug screen was October 25, 2010, and was consistent.   Repeat UDS 11/07/2011 inconsistant, but patient had not been on her usual dose of  percocet after hospitalization.  Will need to repeat next month.  I have answered all their questions.  They are comfortable with the plan.  I will see them back next month for refill of  medications and brief recheck.   Encouraged to keep appt with PCP at 1:30 today.

## 2011-12-05 NOTE — Patient Instructions (Signed)
Hepatic Encephalopathy  Hepatic encephalopathy is a syndrome. This is a set of symptoms that occur together. It is seen mostly in patients with damage to the liver known as cirrhosis. This is where normal liver tissue has been replaced by scar tissue.   Symptoms of the syndrome include:   Changes in personality.   Mental impairment.   A depressed level of consciousness.  These changes occur because toxins build up in the bloodstream. The build up occurs because the scarred liver cannot rid toxins from the body. The most important of these toxins is ammonia. Toxins can cause abnormal behavior and confusion. Toxins in the blood stream can impair your ability to take care of yourself or others. Some people become very sleepy and cannot be woken easily. In severe cases, the patient lapses into a coma.   CAUSES   There are many things that can cause liver damage that can lead to buildup of toxins. These include:   Diseases that cause cirrhosis of the liver.   Long-term alcohol use with progressive liver damage.   Hepatitis B or C with ongoing infection and liver damage.   Patients without cirrhosis who have undergone shunt surgery.   Kidney failure.   Bleeding in the stomach or intestines.   Infection.   Constipation.   Medications that act upon the central nervous system.   Diuretic therapy.   Excessive dietary protein.  SYMPTOMS   Symptoms of this syndrome are categorized or "staged" based on severity.    Stage 0. Minimal hepatic encephalopathy. No detectable changes in personality or behavior. Minimal changes in memory, concentration, mental function, and physical ability.   Stage 1. Some lack of awareness. Shortened attention span. Problems with addition or subtraction. Possible problems with sleeping or a reversal of the normal sleep pattern. Euphoria, depression, or irritability may be present. Mild confusion. Slowing of mental ability. Tremors may be detected.   Stage 2. Lethargy or apathy.  Disoriented. Strange behavior. Slurred speech. Obvious tremors. Drowsiness, unable to perform mental tasks. Personality changes, and confusion about time.   Stage 3. Very sleepy but can be aroused. Unable to perform mental tasks, cannot keep track of time and place, marked confusion, amnesia, occasional fits of rage, speech cannot be understood.   Stage 4. Coma with or without response to painful stimuli.  DIAGNOSIS   In mild cases, a careful history and physical exam may lead your caregiver to consider possible mild hepatic encephalopathy as the cause of symptoms. The diagnosis is clearer in more severe cases. An elevated blood ammonia level is the classic blood test abnormality in patients with this syndrome. Other tests can be helpful to rule out other diseases.   TREATMENT    Medications are often used to lower the ammonia level in the blood. This usually leads to improvement.   Diets containing vegetable proteins are better than diets rich in animal protein, especially proteins derived from red meats. Eating well-cooked chicken and fish in addition to vegetable protein should be discussed with your caregiver. Malnourished patients are encouraged to add liquid nutritional supplements to their diet.   Antibiotics are sometimes used to try to lessen the volume of bacteria in the intestines that produce ammonia.   Moderate to severe cases of this syndrome usually require a hospital stay and medicine that is given directly into a vein (intravenously).  HOME CARE INSTRUCTIONS   The goal at home is to avoid things that can make the condition worse and lead to a buildup   of ammonia in the blood.   Eat a well balanced diet. Your caregiver can help you with suggestions on this.   Talk to your caregiver before taking vitamin supplements. Large doses of vitamins and minerals, especially vitamin A, iron, or copper, can worsen liver damage.   A low salt diet, water restriction, or diuretic medicine may be needed to  reduce fluid retention.   Avoid alcohol and acetaminophen as well as any over-the-counter medications that contain acetaminophen (check labels). Only take over-the-counter or prescription medicines for pain, discomfort, or fever as directed by your caregiver.   Avoid drugs that are toxic to the liver. Review your medications (both prescription and non-prescription) with your caregiver to make sure those you are taking will not be harmful.   Blood tests may be needed. Follow your caregiver's advice regarding the timing of these.   With this condition you play a critical role in maintaining your own good health. The failure to follow your caregiver's advice and these instructions may result in permanent disability or death.  SEEK MEDICAL CARE IF:    You have increasing fatigue or weakness.   You develop increasing swelling of the abdomen, hands, feet, legs or face.   You develop loss of appetite.   You are feeling sick to your stomach (nausea) and vomiting.   You develop jaundice. This is a yellow discoloration of the skin.   You develop worsening problems with concentration, confusion, and/or problems with sleep.  SEEK IMMEDIATE MEDICAL CARE IF:    You vomit bright red blood or a coffee ground-looking material.   You have blood in your stools. Or the stools turn black and tarry.   You have a fever.   You develop easy bruising or bleeding.   You have a return of slurred speech, change in behavior, or confusion.  MAKE SURE YOU:    Understand these instructions.   Will watch your condition.   Will get help right away if you are not doing well or get worse.  Document Released: 05/22/2006 Document Revised: 03/01/2011 Document Reviewed: 02/26/2007  [employer] Patient Information 2012 [employer].

## 2011-12-05 NOTE — Progress Notes (Signed)
Subjective:    Patient ID: Shannon Charles, female    DOB: 04-19-1927, 78 y.o.   MRN: [id number]  HPI  She was sent to be seen today urgently by pain management, she has unrelenting pain in her upper back. When I last saw her she was sent to the ER for what appeared pulmonary edema but was found to be experiencing anasarca from cirrhosis. It does not sound like she has seen a liver expert. Her son is with her today and he tells me that the pt, he and her family are all miserable and exhausted. She is having worsening edema all over her body and worsening sob with "gasping."  Review of Systems  Constitutional: Positive for diaphoresis, activity change, appetite change and fatigue. Negative for fever, chills and unexpected weight change.  Eyes: Negative.   Respiratory: Positive for shortness of breath and stridor. Negative for cough, chest tightness and wheezing.   Cardiovascular: Positive for leg swelling. Negative for chest pain and palpitations.  Gastrointestinal: Positive for abdominal distention. Negative for nausea, vomiting, abdominal pain, diarrhea and constipation.  Genitourinary: Negative.   Musculoskeletal: Positive for back pain. Negative for myalgias, joint swelling, arthralgias and gait problem.  Skin: Negative for color change, pallor, rash and wound.  Neurological: Positive for weakness. Negative for dizziness, tremors, seizures, syncope, facial asymmetry, speech difficulty, light-headedness, numbness and headaches.  Hematological: Negative for adenopathy. Does not bruise/bleed easily.  Psychiatric/Behavioral: Positive for confusion, disturbed wake/sleep cycle and decreased concentration. Negative for suicidal ideas, hallucinations, behavioral problems, self-injury, dysphoric mood and agitation. The patient is nervous/anxious. The patient is not hyperactive.        Objective:   Physical Exam  Vitals reviewed. Constitutional: She appears well-developed and well-nourished.   Non-toxic appearance. She has a sickly appearance. She appears ill. She appears distressed.  HENT:  Head: Normocephalic and atraumatic.  Mouth/Throat: Oropharynx is clear and moist. No oropharyngeal exudate.  Eyes: Conjunctivae normal are normal. Right eye exhibits no discharge. Left eye exhibits no discharge. No scleral icterus.  Neck: Normal range of motion. Neck supple. No JVD present. No tracheal deviation present. No thyromegaly present.  Cardiovascular: Normal rate, regular rhythm, normal heart sounds and intact distal pulses.  Exam reveals no gallop and no friction rub.   No murmur heard. Pulmonary/Chest: Accessory muscle usage present. No stridor. Tachypnea noted. She is in respiratory distress. She has no decreased breath sounds. She has no wheezes. She has no rhonchi. She has rales in the right lower field and the left lower field.  Abdominal: Soft. Bowel sounds are normal. She exhibits distension and ascites. She exhibits no shifting dullness, no pulsatile liver, no fluid wave, no abdominal bruit, no pulsatile midline mass and no mass. There is no hepatosplenomegaly. There is no tenderness. There is no rebound, no guarding and no CVA tenderness. No hernia. Hernia confirmed negative in the ventral area.  Musculoskeletal: Normal range of motion. She exhibits edema (3+ pitting edema in BLE). She exhibits no tenderness.  Lymphadenopathy:    She has no cervical adenopathy.  Skin: Skin is warm. No rash noted. She is diaphoretic. No erythema. No pallor.  Psychiatric: Judgment and thought content normal. Her mood appears anxious. Her affect is not angry, not blunt, not labile and not inappropriate. Her speech is delayed. Her speech is not rapid and/or pressured, not tangential and not slurred. She is slowed and withdrawn. She is not agitated, not aggressive, is not hyperactive, not actively hallucinating and not combative. Cognition and memory are impaired.  She does not exhibit a depressed mood. She  is noncommunicative. She exhibits abnormal recent memory and abnormal remote memory. She is inattentive.      Lab Results  Component Value Date   WBC 8.8 11/07/2011   HGB 10.7* 11/07/2011   HCT 34.4* 11/07/2011   PLT 196 11/07/2011   GLUCOSE 99 11/07/2011   CHOL 176 12/12/2010   TRIG 106 12/12/2010   HDL 71 12/12/2010   LDLDIRECT 174.8 12/22/2009   LDLCALC 84 12/12/2010   ALT 15 11/07/2011   AST 46* 11/07/2011   NA 143 11/07/2011   K 3.7 11/07/2011   CL 105 11/07/2011   CREATININE 0.89 11/07/2011   BUN 12 11/07/2011   CO2 28 11/07/2011   TSH 0.151* 07/14/2011   INR 1.08 12/11/2010   HGBA1C 9.1* 07/14/2011   MICROALBUR 3.4* 07/20/2010      Assessment & Plan:

## 2011-12-05 NOTE — Patient Instructions (Addendum)
Followup with Dr. Everardo All for your other medical problems.  PCP Appt  arranged  right after this appt today.  Please bring your pain medication bottle to each visit with [place].  Follow up here at the rehabilitation clinic in one month.

## 2011-12-06 ENCOUNTER — Encounter: Payer: Self-pay | Admitting: Internal Medicine

## 2011-12-06 LAB — COMPREHENSIVE METABOLIC PANEL
ALT: 22 U/L (ref 0–35)
CO2: 27 mEq/L (ref 19–32)
Chloride: 109 mEq/L (ref 96–112)
GFR: 56.39 mL/min — ABNORMAL LOW (ref >60.00)
Potassium: 3 mEq/L — ABNORMAL LOW (ref 3.5–5.1)
Sodium: 145 mEq/L (ref 135–145)
Total Bilirubin: 1.1 mg/dL (ref 0.3–1.2)
Total Protein: 7.2 g/dL (ref 6.0–8.3)

## 2011-12-06 NOTE — Assessment & Plan Note (Signed)
I will check her TSH level today and will adjust the dose if needed

## 2011-12-06 NOTE — Assessment & Plan Note (Signed)
I will recheck her lytes and renal function today 

## 2011-12-06 NOTE — Assessment & Plan Note (Signed)
I will try a higher dose of lasix, she is developing resp distress with hypoxia, this appears to be an end stage/terminal situation, the son dose not wish for her to be taken to the hospital, he agrees to allow hospice to help her with symptom management

## 2011-12-06 NOTE — Assessment & Plan Note (Signed)
I will check her ammonia level today and have asked her to start Xifaxan

## 2011-12-06 NOTE — Assessment & Plan Note (Signed)
This appears late stage and terminal

## 2011-12-06 NOTE — Assessment & Plan Note (Signed)
I will check her a1c and renal function today

## 2011-12-10 ENCOUNTER — Ambulatory Visit: Payer: Medicare Other | Admitting: Endocrinology

## 2011-12-10 ENCOUNTER — Telehealth: Payer: Self-pay | Admitting: *Deleted

## 2011-12-10 DIAGNOSIS — Z0289 Encounter for other administrative examinations: Secondary | ICD-10-CM

## 2011-12-10 NOTE — Telephone Encounter (Signed)
FYI:  [facility] has tried multiple times to contact pt with no success.

## 2011-12-11 ENCOUNTER — Other Ambulatory Visit: Payer: Self-pay | Admitting: Internal Medicine

## 2011-12-11 ENCOUNTER — Telehealth: Payer: Self-pay

## 2011-12-11 ENCOUNTER — Encounter: Payer: Self-pay | Admitting: Internal Medicine

## 2011-12-11 DIAGNOSIS — E876 Hypokalemia: Secondary | ICD-10-CM

## 2011-12-11 MED ORDER — POTASSIUM CHLORIDE CRYS ER 20 MEQ PO TBCR: 20.0000 meq | EXTENDED_RELEASE_TABLET | Freq: Three times a day (TID) | ORAL | Status: DC

## 2011-12-11 NOTE — Telephone Encounter (Signed)
FYI.. Patient never returned back to lab to have ammonia level collected. This was ordered on 12/05/11 and the lab and called multiple times to contact pt.

## 2011-12-17 ENCOUNTER — Telehealth: Payer: Self-pay

## 2011-12-17 ENCOUNTER — Ambulatory Visit: Payer: Medicare Other

## 2011-12-17 DIAGNOSIS — K7460 Unspecified cirrhosis of liver: Secondary | ICD-10-CM

## 2011-12-17 DIAGNOSIS — R609 Edema, unspecified: Secondary | ICD-10-CM

## 2011-12-17 DIAGNOSIS — K7290 Hepatic failure, unspecified without coma: Secondary | ICD-10-CM

## 2011-12-17 LAB — AMMONIA: Ammonia: 113 umol/L — ABNORMAL HIGH (ref 16–53)

## 2011-12-17 NOTE — Telephone Encounter (Signed)
Please be sure that she has started taking the Xifaxan

## 2011-12-17 NOTE — Telephone Encounter (Signed)
Patient was finally able to return to lab to have ammonia level collected. [facility] just called to report a critical level of 1.13. THanks

## 2011-12-18 NOTE — Telephone Encounter (Signed)
Spoke with pt husband who advised/confimed that pt is taking medication as prescribed

## 2012-01-04 ENCOUNTER — Telehealth: Payer: Self-pay | Admitting: Internal Medicine

## 2012-01-04 DIAGNOSIS — F068 Other specified mental disorders due to known physiological condition: Secondary | ICD-10-CM

## 2012-01-04 NOTE — Telephone Encounter (Signed)
Josephine from [facility] is returning your call. She attempted to call pt's husband but their phone line would not connect. Can you please call Julieanne Cotton back regarding an appointment?

## 2012-01-04 NOTE — Telephone Encounter (Signed)
Shannon Charles, can you call back if you get a chance the number is on the fax machine.

## 2012-01-04 NOTE — Telephone Encounter (Signed)
Pt husband called stating that Pt is having severe diarrhea, Pt is going every 15 minutes. Husband is wanting to know if the Potassium is causing this. Should they decrease form 3 a day or completely remove her from the medication. Also wants to know about another hospice referral. Best call back # [phone].

## 2012-01-04 NOTE — Telephone Encounter (Signed)
Patients husband is calling to see if we can initiate another hospice referral for the patient, the husband says he was unaware of the first one

## 2012-01-04 NOTE — Telephone Encounter (Signed)
Hospice called for Pt.

## 2012-01-04 NOTE — Telephone Encounter (Signed)
Spoke joesphine @ hospice message aleady addressed

## 2012-01-04 NOTE — Telephone Encounter (Signed)
done

## 2012-01-09 ENCOUNTER — Encounter
Payer: Medicare Other | Attending: Physical Medicine and Rehabilitation | Admitting: Physical Medicine and Rehabilitation

## 2012-01-09 ENCOUNTER — Encounter: Payer: Self-pay | Admitting: Physical Medicine and Rehabilitation

## 2012-01-09 VITALS — BP 132/37 | HR 100 | Resp 18 | Ht 62.0 in | Wt 207.0 lb

## 2012-01-09 DIAGNOSIS — E785 Hyperlipidemia, unspecified: Secondary | ICD-10-CM | POA: Insufficient documentation

## 2012-01-09 DIAGNOSIS — R279 Unspecified lack of coordination: Secondary | ICD-10-CM | POA: Insufficient documentation

## 2012-01-09 DIAGNOSIS — E039 Hypothyroidism, unspecified: Secondary | ICD-10-CM | POA: Insufficient documentation

## 2012-01-09 DIAGNOSIS — M25529 Pain in unspecified elbow: Secondary | ICD-10-CM | POA: Insufficient documentation

## 2012-01-09 DIAGNOSIS — M25569 Pain in unspecified knee: Secondary | ICD-10-CM | POA: Insufficient documentation

## 2012-01-09 DIAGNOSIS — I10 Essential (primary) hypertension: Secondary | ICD-10-CM | POA: Insufficient documentation

## 2012-01-09 DIAGNOSIS — M1990 Unspecified osteoarthritis, unspecified site: Secondary | ICD-10-CM

## 2012-01-09 DIAGNOSIS — E109 Type 1 diabetes mellitus without complications: Secondary | ICD-10-CM | POA: Insufficient documentation

## 2012-01-09 DIAGNOSIS — F0390 Unspecified dementia without behavioral disturbance: Secondary | ICD-10-CM | POA: Insufficient documentation

## 2012-01-09 DIAGNOSIS — M545 Low back pain, unspecified: Secondary | ICD-10-CM | POA: Insufficient documentation

## 2012-01-09 DIAGNOSIS — M542 Cervicalgia: Secondary | ICD-10-CM | POA: Insufficient documentation

## 2012-01-09 MED ORDER — OXYCODONE HCL 5 MG PO TABS: 5.0000 mg | ORAL_TABLET | Freq: Three times a day (TID) | ORAL | Status: DC | PRN

## 2012-01-09 NOTE — Progress Notes (Signed)
Subjective:    Patient ID: Shannon Charles, female    DOB: May 28, 1927, 78 y.o.   MRN: [id number]  HPI  78 year old  African American woman with recent h/o anasarca, recent acute renal failure, late stage cirrhosis,diabetes mellitus type 1, hepatic encephalopathy, hypothyroidism.  She is brought to clinic by her grandson who has been bringing her regularly.  She is here today for a refill of her pain medication.   She has multiple chronic pain complaints, her chief complaint being low back pain but intermittently her shoulders and her knees bother her as well.  Due to her cognitive deficits her family manages her pain medication for her.     Pain Inventory Average Pain 10 Pain Right Now 10 My pain is sharp and burning  In the last 24 hours, has pain interfered with the following? General activity 10 Relation with others 10 Enjoyment of life 10 What TIME of day is your pain at its worst? all the time Sleep (in general) Fair  Pain is worse with: walking, bending, sitting and inactivity Pain improves with: medication Relief from Meds: 0  Mobility use a walker how many minutes can you walk? 3 ability to climb steps?  no do you drive?  no Do you have any goals in this area?  yes  Function retired  Neuro/Psych bladder control problems trouble walking confusion depression anxiety  Prior Studies Any changes since last visit?  no  Physicians involved in your care Any changes since last visit?  no   Family History  Problem Relation Age of Onset  . Cancer Sister     uncertain type  . Cirrhosis Mother    History   Social History  . Marital Status: Married    Spouse Name: N/A    Number of Children: N/A  . Years of Education: N/A   Social History Main Topics  . Smoking status: Never Smoker   . Smokeless tobacco: None  . Alcohol Use: No  . Drug Use: No  . Sexually Active: No   Other Topics Concern  . None   Social History Narrative   High School  GraduateHusb is now in nursing home   Past Surgical History  Procedure Date  . Total abdominal hysterectomy   . Colonoscopy w/ polypectomy    Past Medical History  Diagnosis Date  . DEMENTIA 08/23/2006  . DEPRESSION 08/23/2006  . DIABETES MELLITUS, TYPE I 08/23/2006  . HYPERLIPIDEMIA 08/23/2006  . HYPERTENSION 08/23/2006  . HYPOTHYROIDISM 08/23/2006  . OSTEOARTHRITIS 08/23/2006  . RENAL INSUFFICIENCY 08/23/2006  . Personal history of colonic polyps 04/29/2007  . Lung disease, restrictive     Mild, by pulmonary function test in October 2006 with mildly reduce DLCO  . Pain in limb   . Chronic pain syndrome   . Lack of coordination   . Lumbago   . Primary localized osteoarthrosis, lower leg   . Disorders of bursae and tendons in shoulder region, unspecified    BP 132/37  Pulse 100  Resp 18  Ht 5\' 2"  (1.575 m)  Wt 207 lb (93.895 kg)  BMI 37.86 kg/m2  SpO2 96%     Review of Systems  Musculoskeletal: Positive for myalgias, back pain, arthralgias and gait problem.  Psychiatric/Behavioral: Positive for confusion and dysphoric mood. The patient is nervous/anxious.   All other systems reviewed and are negative.       Objective:   Physical Exam She is an obese [place] American woman who does not appear in distress today.  She is oriented to person, place and month.   She is alert and cooperative. Follows commands without  Difficulty.   Answers my questions appropriately.   H/o Bells palsy on right  Coordination is grossly intact.  Her reflexes are diminished in the  lower extremities.  There is no abnormal tone, clonus, or tremors.  She has good strength at hip flexors, knee extensors, dorsiflexors, and  plantar flexors.  Her hip extensors are slightly weak and she has some  difficulty exiting a chair, although she does have some knee pain as she  exits as well.   Her gait is slightly mildly unstable.  She does use a walker for  community distances and a cane in the  home.   Mild limitations in cervical range of motion without aggravation of sx   Flexion and extension of the lumbar spine does not aggravate pain.  Flexion and extension at the knees, she does report some mild  discomfort.  She has crepitus in both knees bilaterally without  effusion.   Bilateral lower extremity.   *RADIOLOGY REPORT* 06/28/10  Clinical Data: Low back and bilateral hip and lower extremity pain.  MRI LUMBAR SPINE WITHOUT CONTRAST  Technique: Multiplanar and multiecho pulse sequences of the lumbar  spine were obtained without intravenous contrast.  Comparison: Plain films lumbar spine 01/30/2008.  Findings: Vertebral body height and alignment are maintained. No  worrisome marrow lesion is identified with a few small hemangiomas  seen and some degenerative discogenic marrow signal change  identified at L4-5. The conus medullaris is normal in signal and  position. Imaged intra-abdominal contents are unremarkable.  The T11-12 and T12-L1 levels are imaged in the sagittal plane only  and negative.  L1-2: Negative.  L2-3: Mild disc bulge. Central canal and foramina widely patent.  L3-4: Mild disc bulge and facet arthropathy. Central canal and  foramina are open.  L4-5: Mild disc bulging facet arthropathy. Central canal and  foramina are open.  L5-S1: Negative.  IMPRESSION:  Mild spondylosis of the lumbar spine without central canal or  foraminal narrowing.  Original Report Authenticated By: [id number]       Assessment & Plan:  1.Cervicalgia   2. Lumbago, currently not a major problem at this time.   3. Bilateral knee pain. This does interfere with transferring from  sitting to standing and also bothers her when she is sitting for a  prolonged period of time or up walking.   4. Mild-moderate balance disorder.  PLAN:  Will refill Percocet, check UDS next visit, remind family to bring in narcotic bottles for pill counts.  I will refill her Percocet, she takes  5/325 tablet, One PO tid.   She has been taking her medications as prescribed. No evidence of aberrant behavior is  observed. She reports fair relief with the medications.   Her urine drug screen was October 25, 2010, and was consistent.   Repeat UDS 11/07/2011 inconsistant, but patient had not been on her usual dose of percocet after hospitalization.    Will need to repeat next month. Med fill date was 12/05/11 pt ran out of meds a few days before her visit.  Need to have family schedule appt with in 30 days of fill date.  No UDS today.  I have answered all their questions.  They are comfortable with the plan.  I will see them back next month for refill of  medications and brief recheck.

## 2012-01-09 NOTE — Patient Instructions (Signed)
Please keep your pain medications locked up and in a secure location  Please bring your medication bottles to each visit.

## 2012-01-15 ENCOUNTER — Other Ambulatory Visit: Payer: Self-pay | Admitting: *Deleted

## 2012-01-15 MED ORDER — MEMANTINE HCL 10 MG PO TABS: 10.0000 mg | ORAL_TABLET | Freq: Two times a day (BID) | ORAL | Status: AC

## 2012-01-15 MED ORDER — MEMANTINE HCL 10 MG PO TABS: 10.0000 mg | ORAL_TABLET | Freq: Two times a day (BID) | ORAL | Status: DC

## 2012-01-15 NOTE — Telephone Encounter (Signed)
Pt's spouse called-pt needs rx for temporary supply of Namenda sent to [facility] and 90 day supply sent to [facility].

## 2012-01-15 NOTE — Telephone Encounter (Signed)
Ok, please refill prn 

## 2012-01-22 ENCOUNTER — Emergency Department (HOSPITAL_COMMUNITY): Payer: Medicare Other

## 2012-01-22 ENCOUNTER — Observation Stay (HOSPITAL_COMMUNITY): Payer: Medicare Other

## 2012-01-22 ENCOUNTER — Encounter (HOSPITAL_COMMUNITY): Payer: Self-pay | Admitting: Emergency Medicine

## 2012-01-22 ENCOUNTER — Inpatient Hospital Stay (HOSPITAL_COMMUNITY)
Admission: EM | Admit: 2012-01-22 | Discharge: 2012-02-01 | DRG: 069 | Disposition: A | Discharge status: Skilled Nursing Facility | Payer: Medicare Other | Attending: Family Medicine | Admitting: Family Medicine

## 2012-01-22 DIAGNOSIS — I129 Hypertensive chronic kidney disease with stage 1 through stage 4 chronic kidney disease, or unspecified chronic kidney disease: Secondary | ICD-10-CM | POA: Diagnosis present

## 2012-01-22 DIAGNOSIS — Z794 Long term (current) use of insulin: Secondary | ICD-10-CM

## 2012-01-22 DIAGNOSIS — E785 Hyperlipidemia, unspecified: Secondary | ICD-10-CM

## 2012-01-22 DIAGNOSIS — F411 Generalized anxiety disorder: Secondary | ICD-10-CM | POA: Diagnosis present

## 2012-01-22 DIAGNOSIS — D500 Iron deficiency anemia secondary to blood loss (chronic): Secondary | ICD-10-CM

## 2012-01-22 DIAGNOSIS — E039 Hypothyroidism, unspecified: Secondary | ICD-10-CM

## 2012-01-22 DIAGNOSIS — F329 Major depressive disorder, single episode, unspecified: Secondary | ICD-10-CM | POA: Diagnosis present

## 2012-01-22 DIAGNOSIS — Z79899 Other long term (current) drug therapy: Secondary | ICD-10-CM

## 2012-01-22 DIAGNOSIS — S22009A Unspecified fracture of unspecified thoracic vertebra, initial encounter for closed fracture: Secondary | ICD-10-CM | POA: Diagnosis present

## 2012-01-22 DIAGNOSIS — N259 Disorder resulting from impaired renal tubular function, unspecified: Secondary | ICD-10-CM

## 2012-01-22 DIAGNOSIS — G510 Bell's palsy: Secondary | ICD-10-CM

## 2012-01-22 DIAGNOSIS — D649 Anemia, unspecified: Secondary | ICD-10-CM

## 2012-01-22 DIAGNOSIS — K7290 Hepatic failure, unspecified without coma: Secondary | ICD-10-CM

## 2012-01-22 DIAGNOSIS — E109 Type 1 diabetes mellitus without complications: Secondary | ICD-10-CM

## 2012-01-22 DIAGNOSIS — R51 Headache: Secondary | ICD-10-CM

## 2012-01-22 DIAGNOSIS — F0390 Unspecified dementia without behavioral disturbance: Secondary | ICD-10-CM | POA: Diagnosis present

## 2012-01-22 DIAGNOSIS — F3289 Other specified depressive episodes: Secondary | ICD-10-CM

## 2012-01-22 DIAGNOSIS — E876 Hypokalemia: Secondary | ICD-10-CM

## 2012-01-22 DIAGNOSIS — W19XXXA Unspecified fall, initial encounter: Secondary | ICD-10-CM | POA: Diagnosis present

## 2012-01-22 DIAGNOSIS — R0602 Shortness of breath: Secondary | ICD-10-CM

## 2012-01-22 DIAGNOSIS — M545 Low back pain, unspecified: Secondary | ICD-10-CM

## 2012-01-22 DIAGNOSIS — N189 Chronic kidney disease, unspecified: Secondary | ICD-10-CM | POA: Diagnosis present

## 2012-01-22 DIAGNOSIS — I10 Essential (primary) hypertension: Secondary | ICD-10-CM

## 2012-01-22 DIAGNOSIS — K7460 Unspecified cirrhosis of liver: Secondary | ICD-10-CM

## 2012-01-22 DIAGNOSIS — F068 Other specified mental disorders due to known physiological condition: Secondary | ICD-10-CM

## 2012-01-22 DIAGNOSIS — R05 Cough: Secondary | ICD-10-CM

## 2012-01-22 DIAGNOSIS — R601 Generalized edema: Secondary | ICD-10-CM

## 2012-01-22 DIAGNOSIS — G459 Transient cerebral ischemic attack, unspecified: Principal | ICD-10-CM

## 2012-01-22 DIAGNOSIS — R5381 Other malaise: Secondary | ICD-10-CM | POA: Diagnosis present

## 2012-01-22 DIAGNOSIS — N179 Acute kidney failure, unspecified: Secondary | ICD-10-CM

## 2012-01-22 DIAGNOSIS — R61 Generalized hyperhidrosis: Secondary | ICD-10-CM

## 2012-01-22 DIAGNOSIS — G8929 Other chronic pain: Secondary | ICD-10-CM | POA: Diagnosis present

## 2012-01-22 DIAGNOSIS — A498 Other bacterial infections of unspecified site: Secondary | ICD-10-CM | POA: Diagnosis present

## 2012-01-22 DIAGNOSIS — M1990 Unspecified osteoarthritis, unspecified site: Secondary | ICD-10-CM

## 2012-01-22 DIAGNOSIS — N390 Urinary tract infection, site not specified: Secondary | ICD-10-CM

## 2012-01-22 DIAGNOSIS — J811 Chronic pulmonary edema: Secondary | ICD-10-CM | POA: Diagnosis present

## 2012-01-22 DIAGNOSIS — R609 Edema, unspecified: Secondary | ICD-10-CM

## 2012-01-22 DIAGNOSIS — E119 Type 2 diabetes mellitus without complications: Secondary | ICD-10-CM | POA: Diagnosis present

## 2012-01-22 DIAGNOSIS — Z66 Do not resuscitate: Secondary | ICD-10-CM | POA: Diagnosis present

## 2012-01-22 DIAGNOSIS — L97529 Non-pressure chronic ulcer of other part of left foot with unspecified severity: Secondary | ICD-10-CM

## 2012-01-22 DIAGNOSIS — R059 Cough, unspecified: Secondary | ICD-10-CM

## 2012-01-22 DIAGNOSIS — R9431 Abnormal electrocardiogram [ECG] [EKG]: Secondary | ICD-10-CM

## 2012-01-22 DIAGNOSIS — K7682 Hepatic encephalopathy: Secondary | ICD-10-CM

## 2012-01-22 DIAGNOSIS — S22080A Wedge compression fracture of T11-T12 vertebra, initial encounter for closed fracture: Secondary | ICD-10-CM

## 2012-01-22 DIAGNOSIS — M1710 Unilateral primary osteoarthritis, unspecified knee: Secondary | ICD-10-CM | POA: Diagnosis present

## 2012-01-22 DIAGNOSIS — Z860100 Personal history of colon polyps, unspecified: Secondary | ICD-10-CM

## 2012-01-22 DIAGNOSIS — K7689 Other specified diseases of liver: Secondary | ICD-10-CM

## 2012-01-22 DIAGNOSIS — J984 Other disorders of lung: Secondary | ICD-10-CM

## 2012-01-22 DIAGNOSIS — Z7982 Long term (current) use of aspirin: Secondary | ICD-10-CM

## 2012-01-22 HISTORY — DX: Unspecified cirrhosis of liver: K74.60

## 2012-01-22 HISTORY — DX: Bell's palsy: G51.0

## 2012-01-22 LAB — CBC
HCT: 21.2 % — ABNORMAL LOW (ref 36.0–46.0)
MCV: 86.9 fL (ref 78.0–100.0)
Platelets: 152 10*3/uL (ref 150–400)
RBC: 2.44 MIL/uL — ABNORMAL LOW (ref 3.87–5.11)
RDW: 19.6 % — ABNORMAL HIGH (ref 11.5–15.5)
WBC: 10.6 10*3/uL — ABNORMAL HIGH (ref 4.0–10.5)

## 2012-01-22 LAB — URINALYSIS, ROUTINE W REFLEX MICROSCOPIC
Bilirubin Urine: NEGATIVE
Hgb urine dipstick: NEGATIVE
Ketones, ur: NEGATIVE mg/dL
Nitrite: POSITIVE — AB
Protein, ur: NEGATIVE mg/dL
Specific Gravity, Urine: 1.012 (ref 1.005–1.030)
Urobilinogen, UA: 0.2 mg/dL (ref 0.0–1.0)

## 2012-01-22 LAB — CBC WITH DIFFERENTIAL/PLATELET
HCT: 22.6 % — ABNORMAL LOW (ref 36.0–46.0)
Hemoglobin: 6.6 g/dL — CL (ref 12.0–15.0)
Lymphocytes Relative: 6 % — ABNORMAL LOW (ref 12–46)
Monocytes Absolute: 1.4 10*3/uL — ABNORMAL HIGH (ref 0.1–1.0)
Monocytes Relative: 12 % (ref 3–12)
Neutro Abs: 9 10*3/uL — ABNORMAL HIGH (ref 1.7–7.7)
RBC: 2.61 MIL/uL — ABNORMAL LOW (ref 3.87–5.11)
WBC: 11.1 10*3/uL — ABNORMAL HIGH (ref 4.0–10.5)

## 2012-01-22 LAB — BASIC METABOLIC PANEL
Calcium: 8.5 mg/dL (ref 8.4–10.5)
Chloride: 105 mEq/L (ref 96–112)
GFR calc Af Amer: 39 mL/min — ABNORMAL LOW (ref >90)
GFR calc non Af Amer: 34 mL/min — ABNORMAL LOW (ref >90)
Glucose, Bld: 232 mg/dL — ABNORMAL HIGH (ref 70–99)
Potassium: 4.6 mEq/L (ref 3.5–5.1)

## 2012-01-22 LAB — GLUCOSE, CAPILLARY
Glucose-Capillary: 225 mg/dL — ABNORMAL HIGH (ref 70–99)
Glucose-Capillary: 122 mg/dL — ABNORMAL HIGH (ref 70–99)

## 2012-01-22 LAB — AMMONIA: Ammonia: 53 umol/L (ref 11–60)

## 2012-01-22 LAB — LACTATE DEHYDROGENASE: LDH: 367 U/L — ABNORMAL HIGH (ref 94–250)

## 2012-01-22 LAB — PROTIME-INR: INR: 1.44 (ref 0.00–1.49)

## 2012-01-22 LAB — TECHNOLOGIST SMEAR REVIEW

## 2012-01-22 LAB — HEPATIC FUNCTION PANEL
ALT: 16 U/L (ref 0–35)
AST: 38 U/L — ABNORMAL HIGH (ref 0–37)
Bilirubin, Direct: 0.3 mg/dL (ref 0.0–0.3)
Indirect Bilirubin: 0.3 mg/dL (ref 0.3–0.9)
Total Protein: 6.9 g/dL (ref 6.0–8.3)

## 2012-01-22 LAB — TROPONIN I: Troponin I: <0.3 ng/mL (ref <0.30)

## 2012-01-22 LAB — RETICULOCYTES: Retic Ct Pct: 2.2 % (ref 0.4–3.1)

## 2012-01-22 MED ORDER — INSULIN GLARGINE 100 UNIT/ML ~~LOC~~ SOLN
50.0000 [IU] | Freq: Every day | SUBCUTANEOUS | Status: DC
  Administered 2012-01-22 – 2012-01-24 (×3): 50 [IU] via SUBCUTANEOUS

## 2012-01-22 MED ORDER — INSULIN NPH (HUMAN) (ISOPHANE) 100 UNIT/ML ~~LOC~~ SUSP
85.0000 [IU] | SUBCUTANEOUS | Status: DC
  Filled 2012-01-22: qty 10

## 2012-01-22 MED ORDER — AMITRIPTYLINE HCL 10 MG PO TABS
10.0000 mg | ORAL_TABLET | Freq: Every day | ORAL | Status: DC
  Administered 2012-01-22 – 2012-01-31 (×10): 10 mg via ORAL
  Filled 2012-01-22 (×11): qty 1

## 2012-01-22 MED ORDER — INSULIN ASPART 100 UNIT/ML ~~LOC~~ SOLN
3.0000 [IU] | Freq: Three times a day (TID) | SUBCUTANEOUS | Status: DC
  Administered 2012-01-23 – 2012-01-26 (×9): 3 [IU] via SUBCUTANEOUS

## 2012-01-22 MED ORDER — LEVOTHYROXINE SODIUM 175 MCG PO TABS
175.0000 ug | ORAL_TABLET | Freq: Every day | ORAL | Status: DC
  Administered 2012-01-23 – 2012-02-01 (×10): 175 ug via ORAL
  Filled 2012-01-22 (×11): qty 1

## 2012-01-22 MED ORDER — ONDANSETRON HCL 4 MG/2ML IJ SOLN: 4.0000 mg | Freq: Three times a day (TID) | INTRAMUSCULAR | Status: AC | PRN

## 2012-01-22 MED ORDER — OXYCODONE HCL 5 MG PO TABS
5.0000 mg | ORAL_TABLET | Freq: Three times a day (TID) | ORAL | Status: DC | PRN
  Administered 2012-01-22 – 2012-02-01 (×18): 5 mg via ORAL
  Filled 2012-01-22 (×19): qty 1

## 2012-01-22 MED ORDER — DONEPEZIL HCL 10 MG PO TABS
10.0000 mg | ORAL_TABLET | Freq: Every day | ORAL | Status: DC
  Administered 2012-01-22 – 2012-01-31 (×10): 10 mg via ORAL
  Filled 2012-01-22 (×11): qty 1

## 2012-01-22 MED ORDER — SENNOSIDES-DOCUSATE SODIUM 8.6-50 MG PO TABS: 1.0000 | ORAL_TABLET | Freq: Every evening | ORAL | Status: DC | PRN

## 2012-01-22 MED ORDER — MEMANTINE HCL 10 MG PO TABS
10.0000 mg | ORAL_TABLET | Freq: Two times a day (BID) | ORAL | Status: DC
  Administered 2012-01-22 – 2012-02-01 (×20): 10 mg via ORAL
  Filled 2012-01-22 (×21): qty 1

## 2012-01-22 MED ORDER — ONDANSETRON HCL 4 MG/2ML IJ SOLN: 4.0000 mg | Freq: Four times a day (QID) | INTRAMUSCULAR | Status: DC | PRN

## 2012-01-22 MED ORDER — DEXTROSE 5 % IV SOLN
1.0000 g | INTRAVENOUS | Status: DC
  Administered 2012-01-22 – 2012-01-23 (×2): 1 g via INTRAVENOUS
  Filled 2012-01-22 (×2): qty 10

## 2012-01-22 MED ORDER — SODIUM CHLORIDE 0.9 % IV SOLN
INTRAVENOUS | Status: AC
  Administered 2012-01-22: 20 mL/h via INTRAVENOUS

## 2012-01-22 MED ORDER — FUROSEMIDE 10 MG/ML IJ SOLN
20.0000 mg | Freq: Once | INTRAMUSCULAR | Status: AC
  Administered 2012-01-22: 20 mg via INTRAVENOUS
  Filled 2012-01-22: qty 2

## 2012-01-22 MED ORDER — INSULIN ASPART 100 UNIT/ML ~~LOC~~ SOLN
0.0000 [IU] | Freq: Three times a day (TID) | SUBCUTANEOUS | Status: DC
  Administered 2012-01-23: 1 [IU] via SUBCUTANEOUS
  Administered 2012-01-23: 2 [IU] via SUBCUTANEOUS
  Administered 2012-01-27 – 2012-01-30 (×3): 1 [IU] via SUBCUTANEOUS
  Administered 2012-01-31: 2 [IU] via SUBCUTANEOUS
  Administered 2012-01-31: 1 [IU] via SUBCUTANEOUS

## 2012-01-22 MED ORDER — SODIUM CHLORIDE 0.9 % IV BOLUS (SEPSIS)
500.0000 mL | Freq: Once | INTRAVENOUS | Status: AC
  Administered 2012-01-22: 500 mL via INTRAVENOUS

## 2012-01-22 MED ORDER — FUROSEMIDE 40 MG PO TABS
40.0000 mg | ORAL_TABLET | Freq: Three times a day (TID) | ORAL | Status: DC
  Administered 2012-01-22 – 2012-02-01 (×28): 40 mg via ORAL
  Filled 2012-01-22 (×31): qty 1

## 2012-01-22 MED ORDER — SERTRALINE HCL 50 MG PO TABS
50.0000 mg | ORAL_TABLET | Freq: Two times a day (BID) | ORAL | Status: DC
  Administered 2012-01-22 – 2012-02-01 (×21): 50 mg via ORAL
  Filled 2012-01-22 (×23): qty 1

## 2012-01-22 MED ORDER — LACTULOSE 10 GM/15ML PO SOLN
20.0000 g | Freq: Every day | ORAL | Status: DC
  Administered 2012-01-22 – 2012-02-01 (×10): 20 g via ORAL
  Filled 2012-01-22 (×12): qty 30

## 2012-01-22 MED ORDER — RIFAXIMIN 200 MG PO TABS
200.0000 mg | ORAL_TABLET | Freq: Three times a day (TID) | ORAL | Status: DC
  Administered 2012-01-22 – 2012-01-23 (×4): 200 mg via ORAL
  Filled 2012-01-22 (×7): qty 1

## 2012-01-22 MED ORDER — SIMVASTATIN 40 MG PO TABS
40.0000 mg | ORAL_TABLET | Freq: Every evening | ORAL | Status: DC
  Administered 2012-01-22 – 2012-01-31 (×10): 40 mg via ORAL
  Filled 2012-01-22 (×11): qty 1

## 2012-01-22 MED ORDER — LORAZEPAM 0.5 MG PO TABS
0.5000 mg | ORAL_TABLET | Freq: Two times a day (BID) | ORAL | Status: DC | PRN
  Administered 2012-01-25 – 2012-02-01 (×3): 0.5 mg via ORAL
  Filled 2012-01-22 (×3): qty 1

## 2012-01-22 MED ORDER — LATANOPROST 0.005 % OP SOLN
1.0000 [drp] | Freq: Every day | OPHTHALMIC | Status: DC
  Administered 2012-01-22 – 2012-01-31 (×10): 1 [drp] via OPHTHALMIC
  Filled 2012-01-22 (×2): qty 2.5

## 2012-01-22 NOTE — ED Notes (Signed)
Patient transported to CT 

## 2012-01-22 NOTE — ED Notes (Signed)
Pt. Brought from home via EMS. Husband states she was not understanding him and had right sided facial droop at 13:30.

## 2012-01-22 NOTE — ED Notes (Signed)
Admitting MD remains at bedside talking with pt

## 2012-01-22 NOTE — ED Notes (Signed)
Patient transported to X-ray 

## 2012-01-22 NOTE — Progress Notes (Signed)
Pt transferred to 4N rm 03 via stretcher from ED on tele monitor transferred to bed and made comfortable in bed vs stable Hr sinus tachy on cardiac nominitor pt . Call bell within reach to call for help as needed   Kept NPO  For Abdominal US and bed side swallow evaluation. Concert for blood transfusion . Report given to oncoming shift RN.

## 2012-01-22 NOTE — H&P (Addendum)
[facility] History and Physical  Shannon Charles:[id number] DOB: 06-05-1927 DOA: 01/22/2012  Referring physician:  Charm Barges PCP: Romero Belling, MD  Dr. Pamelia Hoit pain clinic Dr. Hettie Holstein, therapist.    Chief Complaint:  Facial droop  HPI:   The patient is an 37-yo female with HTN, HLD, DM, CKD, cirrhosis 2/2 NASH with hepatic encephalopathy, chronic pain, hypothyroidism, restrictive lung disease, dementia who presents with weakness and sudden onset facial droop.  She has not been well for many months, not since her last hospitalization in 06/2011.  She has been weak, confused, and generally debilitated.  A few weeks ago, she had some stools that had dark blood in them which were followed by stools that were tarry black.  This lasted for several days.  She then developed frequent watery diarrhea so her family stopped her lactulose.  Her stools are now soft and brown, but she has become progressively weaker and confused.     The day prior to admission she felt otherwise well, then overnight she fell while trying to go to the bathroom.  She did not hit her head or lose consciousness.  This morning, her husband relates that he suddenly noticed that the right side of her face was drooping and she was slurring her speech.  She was generally weak, but did not appear to lose function of her right arm or leg.  Her husband called 911.  By the time EMS arrives, her symptoms had resolved.  In the ER, she was found to be anemic and hemoccult negative.   Her granddaughter and husband contributed to her history.    Review of Systems:   She has severe back pain.  She denies fevers or chills.  Denies URI symptoms.  Denies chest pain, and feels her breathing is Kenyatte Gruber making it difficult to talk which is only a little worse than her baseline.  Denies abdominal pain, discomfort, cramping, distension.  Denies dysuria.  Voids frequently.   She had diarrhea a few weeks ago and her last BM was this morning.   She has had some black bowel movements about a month ago.   She has some bleeding from her big toe and an abrasion on her leg, but no significant bleeding.  She feels tired but not weak anywhere.     Past Medical History  Diagnosis Date  . DEMENTIA 08/23/2006  . DEPRESSION 08/23/2006  . DIABETES MELLITUS, TYPE I 08/23/2006  . HYPERLIPIDEMIA 08/23/2006  . HYPERTENSION 08/23/2006  . HYPOTHYROIDISM 08/23/2006  . OSTEOARTHRITIS 08/23/2006  . RENAL INSUFFICIENCY 08/23/2006  . Personal history of colonic polyps 04/29/2007  . Lung disease, restrictive     Mild, by pulmonary function test in October 2006 with mildly reduce DLCO  . Pain in limb   . Chronic pain syndrome   . Lack of coordination   . Lumbago   . Primary localized osteoarthrosis, lower leg   . Disorders of bursae and tendons in shoulder region, unspecified   . Bell's palsy     per Husband  . Cirrhosis    Past Surgical History  Procedure Date  . Total abdominal hysterectomy   . Colonoscopy w/ polypectomy    Social History:  reports that she has never smoked. She does not have any smokeless tobacco history on file. She reports that she does not drink alcohol or use illicit drugs. Lives with her husband.  Uses a walker and a cane to ambulate.  No driving and she stays in the house mostly.  No Known Allergies  Family History  Problem Relation Age of Onset  . Cancer Sister     uncertain type  . Cirrhosis Mother     Prior to Admission medications   Medication Sig Start Date End Date Taking? Authorizing Provider  amitriptyline (ELAVIL) 10 MG tablet 10 mg at bedtime.     Historical Provider, MD  aspirin 81 MG tablet Take 81 mg by mouth daily.    Historical Provider, MD  donepezil (ARICEPT) 10 MG tablet Take 10 mg by mouth daily.      Historical Provider, MD  furosemide (LASIX) 40 MG tablet Take 1 tablet (40 mg total) by mouth 3 (three) times daily. 12/05/11 12/04/12  Etta Grandchild, MD  insulin NPH (HUMULIN N PEN) 100 UNIT/ML  injection Inject 85 Units into the skin every morning. 09/20/11 09/19/12  Romero Belling, MD  latanoprost (XALATAN) 0.005 % ophthalmic solution Place 1 drop into both eyes at bedtime.    Historical Provider, MD  levothyroxine (SYNTHROID, LEVOTHROID) 175 MCG tablet Take 1 tablet (175 mcg total) by mouth daily. 07/20/11 07/19/12  Shanker Levora Dredge, MD  LORazepam (ATIVAN) 0.5 MG tablet Take 1 tablet (0.5 mg total) by mouth 2 (two) times daily as needed. For anxiety 07/20/11   Maretta Bees, MD  memantine (NAMENDA) 10 MG tablet Take 1 tablet (10 mg total) by mouth 2 (two) times daily. 01/15/12   Romero Belling, MD  oxyCODONE (OXY IR/ROXICODONE) 5 MG immediate release tablet Take 1 tablet (5 mg total) by mouth every 8 (eight) hours as needed for pain. 01/09/12   Ashok Cordia, MD  potassium chloride SA (K-DUR,KLOR-CON) 20 MEQ tablet Take 1 tablet (20 mEq total) by mouth 3 (three) times daily. 12/11/11   Etta Grandchild, MD  sertraline (ZOLOFT) 50 MG tablet Take 50 mg by mouth 2 (two) times daily.     Historical Provider, MD  simvastatin (ZOCOR) 40 MG tablet Take 40 mg by mouth every evening.    Historical Provider, MD  Burman Blacksmith 200 MG tablet  01/03/12   Historical Provider, MD   Physical Exam: Filed Vitals:   01/22/12 1730 01/22/12 1800 01/22/12 1830 01/22/12 1902  BP: 148/62 136/65 127/69 143/61  Pulse: 103 100 100 77  Temp:  98.8 F (37.1 C)  98.2 F (36.8 C)  TempSrc:  Oral  Oral  Resp: 29 24 28 22   Height:      Weight:      SpO2: 96% 99% 98% 99%     General:  Obese AAF, lying in bed, tachypneic   Eyes: PERRL, anicteric, noninjected  ENT: Nares congested, nasal canula in place, OP with tachy tongue, nonerythematous  Neck: supple, mild thyromegaly  Lymph:  No cervical or supraclavicular LAD  Cardiovascular: RRR, nl S1, S2, 2+ pulses  Respiratory:  CTAB, no rales, rhonchi, wheeze, or upper airway noise  Abdomen: NABS, mildly distended, soft, nontender  Skin:  3cm abrasion on left  shin, right large toe with toe nail partly ripped off and dried blood surrounding  Musculoskeletal:  1+ bilateral lower extremity edema  Psychiatric:  Alert and oriented to person, place, but not date  Neurologic: Mild droop to left eyelid, eyebrows raise symmetrically, otherwise CN III-XII grossly intact, sensation intact to light touch, strength 4+/5 throughout except right foot 4/5  Labs on Admission:  Basic Metabolic Panel:  Lab 01/22/12 2952  NA 142  K 4.6  CL 105  CO2 27  GLUCOSE 232*  BUN 26*  CREATININE 1.41*  CALCIUM 8.5  MG --  PHOS --   Liver Function Tests:  Lab 01/22/12 1646  AST 38*  ALT 16  ALKPHOS 102  BILITOT 0.6  PROT 6.9  ALBUMIN 2.8*   No results found for this basename: LIPASE:5,AMYLASE:5 in the last 168 hours  Lab 01/22/12 1645  AMMONIA 53   CBC:  Lab 01/22/12 1646 01/22/12 1446  WBC 10.6* 11.1*  NEUTROABS -- 9.0*  HGB 6.2* 6.6*  HCT 21.2* 22.6*  MCV 86.9 86.6  PLT 152 170   Cardiac Enzymes:  Lab 01/22/12 1931 01/22/12 1447  CKTOTAL -- --  CKMB -- --  CKMBINDEX -- --  TROPONINI <0.30 <0.30    BNP (last 3 results)  Basename 12/05/11 1354 11/07/11 1531 07/02/11 1517  PROBNP 132.0* 184.7 52.0   CBG:  Lab 01/22/12 1922 01/22/12 1540  GLUCAP 122* 225*    Radiological Exams on Admission: Dg Chest 1 View  01/22/2012  *RADIOLOGY REPORT*  Clinical Data: Altered mental status.  Shortness of breath.  CHEST - 1 VIEW  Comparison: Chest x-ray 11/07/2011.  Findings: Lung volumes are very low, and there is persistent elevation of the right hemidiaphragm (noted on prior).  Even allowing for these low lung volumes, there is blunting of the right costophrenic sulcus, suggesting a small right pleural effusion. Crowding of the pulmonary vasculature is noted, without frank pulmonary edema.  Bibasilar opacities are favored to represent subsegmental atelectasis, however, underlying airspace consolidation is difficult to exclude.  Mild cardiomegaly  is unchanged. The patient is rotated to the right on today's exam, resulting in distortion of the mediastinal contours and reduced diagnostic sensitivity and specificity for mediastinal pathology. Atherosclerosis in the thoracic aorta.  Calcified mediastinal and bilateral hilar lymph nodes suggesting old granulomatous disease.  IMPRESSION: 1.  Low lung volumes with probable bibasilar subsegmental atelectasis and a small right pleural effusion. 2.  Cardiomegaly with pulmonary venous congestion, but no frank pulmonary edema at this time. 3.  Atherosclerosis. 4.  Sequelae of old granulomatous disease.   Original Report Authenticated By: Florencia Reasons, M.D.    Ct Head Wo Contrast  01/22/2012  *RADIOLOGY REPORT*  Clinical Data: Facial droop.  CT HEAD WITHOUT CONTRAST  Technique:  Contiguous axial images were obtained from the base of the skull through the vertex without contrast.  Comparison: July 14, 2011.  Findings: Bony calvarium is intact.  No mass effect or midline shift is noted.  Ventricular size is within normal limits.  There is no evidence of mass lesion, hemorrhage or acute infarction.  IMPRESSION: No acute intracranial abnormality is noted.   Original Report Authenticated By: Venita Sheffield., M.D.     EKG: Independently reviewed. Sinus tachycardia  Assessment/Plan Principal Problem:  *TIA (transient ischemic attack) Active Problems:  HYPOTHYROIDISM  HYPERLIPIDEMIA  DEMENTIA  HYPERTENSION  Bell's palsy  Hepatic encephalopathy  Diabetes mellitus  Cirrhosis  Pulmonary edema  Anasarca  Lung disease, restrictive  Anemia, blood loss  TIA versus CVA with mild asymmetry of foot strength:  Patient has risk factors of HTN, HLD, DM.   -  Admit to telemetry -  MRI/MRA -  Carotid duplex -  ECHO -  A1c -  Lipids -  Hold ASA given GIB -  PT/OT/speech   Symptomatic anemia with tachycardia and dypsnea.  Likely due to GI blood loss with upper more likely given melena.  DDx includes  gastritis, PUD, AVM, portal hypertensive gastropathy -  Tx 2 units PRBC -  Lasix 20mg  IV once  to prevent pulmonary edema -  Will need GI follow up  Tachycardia and tachypnea:  Most likely due to pain, GIB and symptomatic anemia.  WBC mildly elevated If no improvement after blood transfusion, however, consider PE versus sepsis.  No evidence of wheeze or rales on exam and CXR negative for pneumonia.  Restrictive lung disease likely contributing -  Telemetry -  Cycle troponins -  Lasix x 1 as above -  Wean oxygen as tolerated  Cirrhosis 2/2 NASH with generalized weakness.  Ammonia level improved from prior, but at risk for SBP which may have contributed to weakness and fall last night.  -  Continue rifaximin -  Continue lasix -  Restart lower dose lactulose -  Abd [place] to eval for ascites -  Patient not on spironolactone  UTI:  Positive nitrites  -  F/u urine culture -  Ceftriaxone   DM, patient will be NPO for periods and may not eat reliably.  Takes 85 units of NPH once daily.  Will do 60% of that in lantus, 3 units with meals and low dose sliding scale. -  A1c -  lantus 50 units in AM -  aspart 3 AC -  SSI low  HTN/HLD.  Blood pressure stable to elevated -  Continue statin -  Patient' BP is diet controlled.  CKD with mild AKI:  May be prerenal and due to anemia:  trend Chronic pain, currently uncontrolled:  Continue elavil, oxycodone prn Dementia, exacerbated by acute infection, possible TIA:  Continue memantine and donepezil Depression/anxiety:  STable, continue zoloft Hypothyroidism:  Stable, continue synthroid  DIET:  NPO pending swallow eval ACCESS:  PIV IVF:  Blood transfusion 2 units PROPH:  SCDs  Code Status: DNR/DNI Family Communication:  Spoke with patient, patient's husband Shannon Charles [phone] and granddaughter Shannon Charles [phone], [phone] who were at bedside.   Disposition Plan: Pending TIA work up, urine culture, tachycardia and tachypnea resolving with transfusion,  likely to rehab/SNF.    Time spent: 68  SHORTThea Silversmith [facility] Pager [phone]  If 7PM-7AM, please contact night-coverage [email or web address] Password [facility] 01/22/2012, 9:23 PM

## 2012-01-22 NOTE — ED Notes (Signed)
Admitting MD at bedside.

## 2012-01-22 NOTE — ED Provider Notes (Signed)
History     CSN: [id number]  Arrival date & time 01/22/12  1427   First MD Initiated Contact with Patient 01/22/12 1427      Chief Complaint  Patient presents with  . Altered Mental Status  . Transient Ischemic Attack    (Consider location/radiation/quality/duration/timing/severity/associated sxs/prior treatment) HPI Pt and husband live at home by themselves and husband became concerned about pt about one hour ago for increased right side facial droop and slurring of speech. This lasted about 10 minutes. No modifying factors. Associated with back pain. No recent fever, cough, chest pain. Pt states that she is experiencing back pain which is chronic for her. No new injuries  Pain described as moderate in intensity. The location of the patient's problem is lower back.      Past Medical History  Diagnosis Date  . DEMENTIA 08/23/2006  . DEPRESSION 08/23/2006  . DIABETES MELLITUS, TYPE I 08/23/2006  . HYPERLIPIDEMIA 08/23/2006  . HYPERTENSION 08/23/2006  . HYPOTHYROIDISM 08/23/2006  . OSTEOARTHRITIS 08/23/2006  . RENAL INSUFFICIENCY 08/23/2006  . Personal history of colonic polyps 04/29/2007  . Lung disease, restrictive     Mild, by pulmonary function test in October 2006 with mildly reduce DLCO  . Pain in limb   . Chronic pain syndrome   . Lack of coordination   . Lumbago   . Primary localized osteoarthrosis, lower leg   . Disorders of bursae and tendons in shoulder region, unspecified     Past Surgical History  Procedure Date  . Total abdominal hysterectomy   . Colonoscopy w/ polypectomy     Family History  Problem Relation Age of Onset  . Cancer Sister     uncertain type  . Cirrhosis Mother     History  Substance Use Topics  . Smoking status: Never Smoker   . Smokeless tobacco: Not on file  . Alcohol Use: No    OB History    Grav Para Term Preterm Abortions TAB SAB Ect Mult Living                  Review of Systems Negative for respiratory distress,  cough. Negative for vomiting, diarrhea.  All other systems reviewed and negative unless noted in HPI.    Allergies  Review of patient's allergies indicates no known allergies.  Home Medications   Current Outpatient Rx  Name Route Sig Dispense Refill  . AMITRIPTYLINE HCL 10 MG PO TABS  10 mg at bedtime.     . ASPIRIN 81 MG PO TABS Oral Take 81 mg by mouth daily.    . DONEPEZIL HCL 10 MG PO TABS Oral Take 10 mg by mouth daily.      . FUROSEMIDE 40 MG PO TABS Oral Take 1 tablet (40 mg total) by mouth 3 (three) times daily. 90 tablet 3  . INSULIN ISOPHANE HUMAN 100 UNIT/ML [place] SUSP Subcutaneous Inject 85 Units into the skin every morning. 90 mL 12  . LATANOPROST 0.005 % OP SOLN Both Eyes Place 1 drop into both eyes at bedtime.    Marland Kitchen LEVOTHYROXINE SODIUM 175 MCG PO TABS Oral Take 1 tablet (175 mcg total) by mouth daily.    Marland Kitchen LORAZEPAM 0.5 MG PO TABS Oral Take 1 tablet (0.5 mg total) by mouth 2 (two) times daily as needed. For anxiety 10 tablet 0  . MEMANTINE HCL 10 MG PO TABS Oral Take 1 tablet (10 mg total) by mouth 2 (two) times daily. 180 tablet 11  . OXYCODONE HCL 5  MG PO TABS Oral Take 1 tablet (5 mg total) by mouth every 8 (eight) hours as needed for pain. 90 tablet 0  . POTASSIUM CHLORIDE CRYS ER 20 MEQ PO TBCR Oral Take 1 tablet (20 mEq total) by mouth 3 (three) times daily. 90 tablet 3  . SERTRALINE HCL 50 MG PO TABS Oral Take 50 mg by mouth 2 (two) times daily.     Marland Kitchen SIMVASTATIN 40 MG PO TABS Oral Take 40 mg by mouth every evening.    Marland Kitchen XIFAXAN 200 MG PO TABS        There were no vitals taken for this visit.  Physical Exam Nursing note and vitals reviewed.  Constitutional: Pt is alert and appears stated age. Oropharynx: Airway open without erythema or exudate. Respiratory: No respiratory distress. Equal breathing bilaterally. CV: Extremities warm and well perfused. Neuro: GCS 15. No sensory deficit. Strength 5/5 in all extremities. R arm slightly weaker than left. CN II-XII  intact. Decreased nasolabial fold on R. Did not test gait. Pt with walker at baseline.  Head: Normocephalic and atraumatic. Eyes: No conjunctivitis, no scleral icterus. Neck: Supple, no mass. Chest: Non-tender. Abdomen: Soft, non-tender MSK: Extremities are atraumatic without deformity. Skin: No rash, no wounds. GU: neg heme occult  ED Course  Procedures (including critical care time)  Labs Reviewed  CBC WITH DIFFERENTIAL - Abnormal; Notable for the following:    WBC 11.1 (*)     RBC 2.61 (*)     Hemoglobin 6.6 (*)     HCT 22.6 (*)     MCH 25.3 (*)     MCHC 29.2 (*)     RDW 19.7 (*)     Neutrophils Relative 81 (*)     Neutro Abs 9.0 (*)     Lymphocytes Relative 6 (*)     Monocytes Absolute 1.4 (*)     All other components within normal limits  BASIC METABOLIC PANEL - Abnormal; Notable for the following:    Glucose, Bld 232 (*)     BUN 26 (*)     Creatinine, Ser 1.41 (*)     GFR calc non Af Amer 34 (*)     GFR calc Af Amer 39 (*)     All other components within normal limits  PROTIME-INR - Abnormal; Notable for the following:    Prothrombin Time 17.2 (*)     All other components within normal limits  GLUCOSE, CAPILLARY - Abnormal; Notable for the following:    Glucose-Capillary 225 (*)     All other components within normal limits  TROPONIN I  URINALYSIS, ROUTINE W REFLEX MICROSCOPIC  TYPE AND SCREEN   Dg Chest 1 View  01/22/2012  *RADIOLOGY REPORT*  Clinical Data: Altered mental status.  Shortness of breath.  CHEST - 1 VIEW  Comparison: Chest x-ray 11/07/2011.  Findings: Lung volumes are very low, and there is persistent elevation of the right hemidiaphragm (noted on prior).  Even allowing for these low lung volumes, there is blunting of the right costophrenic sulcus, suggesting a small right pleural effusion. Crowding of the pulmonary vasculature is noted, without frank pulmonary edema.  Bibasilar opacities are favored to represent subsegmental atelectasis, however,  underlying airspace consolidation is difficult to exclude.  Mild cardiomegaly is unchanged. The patient is rotated to the right on today's exam, resulting in distortion of the mediastinal contours and reduced diagnostic sensitivity and specificity for mediastinal pathology. Atherosclerosis in the thoracic aorta.  Calcified mediastinal and bilateral hilar lymph nodes suggesting  old granulomatous disease.  IMPRESSION: 1.  Low lung volumes with probable bibasilar subsegmental atelectasis and a small right pleural effusion. 2.  Cardiomegaly with pulmonary venous congestion, but no frank pulmonary edema at this time. 3.  Atherosclerosis. 4.  Sequelae of old granulomatous disease.   Original Report Authenticated By: Florencia Reasons, M.D.    Ct Head Wo Contrast  01/22/2012  *RADIOLOGY REPORT*  Clinical Data: Facial droop.  CT HEAD WITHOUT CONTRAST  Technique:  Contiguous axial images were obtained from the base of the skull through the vertex without contrast.  Comparison: July 14, 2011.  Findings: Bony calvarium is intact.  No mass effect or midline shift is noted.  Ventricular size is within normal limits.  There is no evidence of mass lesion, hemorrhage or acute infarction.  IMPRESSION: No acute intracranial abnormality is noted.   Original Report Authenticated By: Venita Sheffield., M.D.      1. TIA (transient ischemic attack)   2. Anemia       MDM  78 y.o. female here with increased R facial droop, slurred speech about one hour ago lasting for 10 minutes.  Pertinent past problems include dementia, DM on insulin, HTN, HL, renal insufficiency. Diagnosed with Bell's palsy about 6 months ago according to husband. Symptoms resolved now per husband and patient is at baseline. Will refrain from calling code stroke. Will work up as TIA.   Data reviewed: EKG ordered and interpreted by me: sinus tachycardia, no axis deviation, no QRS widening, nonspecific ST-T wave changes and no significant changes from  prior EKG.  Lab tests ordered and reviewed by me: CBC remarkable for anemia with hgb 6.6.  Chart review shows >10 about one month ago. INR okay. Troponin low. BMP with slightly elevated Cr.  I independently viewed the following imaging studies and reviewed radiology's interpretation as summarized: CT head with NAICA. CXR without consolidation.  Course of care: Type and screen ordered. Pt stable on re-eval. Pt and family updated on findings treatment plan. Discussed with hospitalist. Plan to admit to tele. Will place order for 1 unit. Pt and family okay with receiving blood. Heme negative here but family does report dark stools over the past few weeks.  Medical Decision Making discussed with ED attending Gerhard Munch, MD        Charm Barges, MD 01/22/12 [phone]

## 2012-01-23 ENCOUNTER — Observation Stay (HOSPITAL_COMMUNITY): Payer: Medicare Other

## 2012-01-23 ENCOUNTER — Inpatient Hospital Stay (HOSPITAL_COMMUNITY): Payer: Medicare Other

## 2012-01-23 DIAGNOSIS — K7689 Other specified diseases of liver: Secondary | ICD-10-CM

## 2012-01-23 DIAGNOSIS — G459 Transient cerebral ischemic attack, unspecified: Principal | ICD-10-CM

## 2012-01-23 DIAGNOSIS — E785 Hyperlipidemia, unspecified: Secondary | ICD-10-CM

## 2012-01-23 DIAGNOSIS — D500 Iron deficiency anemia secondary to blood loss (chronic): Secondary | ICD-10-CM

## 2012-01-23 DIAGNOSIS — N390 Urinary tract infection, site not specified: Secondary | ICD-10-CM

## 2012-01-23 DIAGNOSIS — J984 Other disorders of lung: Secondary | ICD-10-CM

## 2012-01-23 DIAGNOSIS — N179 Acute kidney failure, unspecified: Secondary | ICD-10-CM

## 2012-01-23 DIAGNOSIS — E039 Hypothyroidism, unspecified: Secondary | ICD-10-CM

## 2012-01-23 DIAGNOSIS — K7460 Unspecified cirrhosis of liver: Secondary | ICD-10-CM

## 2012-01-23 DIAGNOSIS — I6789 Other cerebrovascular disease: Secondary | ICD-10-CM

## 2012-01-23 DIAGNOSIS — E119 Type 2 diabetes mellitus without complications: Secondary | ICD-10-CM

## 2012-01-23 LAB — BASIC METABOLIC PANEL
BUN: 25 mg/dL — ABNORMAL HIGH (ref 6–23)
Chloride: 106 mEq/L (ref 96–112)
Creatinine, Ser: 1.33 mg/dL — ABNORMAL HIGH (ref 0.50–1.10)
Glucose, Bld: 129 mg/dL — ABNORMAL HIGH (ref 70–99)
Potassium: 3.7 mEq/L (ref 3.5–5.1)

## 2012-01-23 LAB — CBC
HCT: 24.6 % — ABNORMAL LOW (ref 36.0–46.0)
Hemoglobin: 7.5 g/dL — ABNORMAL LOW (ref 12.0–15.0)
MCV: 87.2 fL (ref 78.0–100.0)
WBC: 10 10*3/uL (ref 4.0–10.5)

## 2012-01-23 LAB — GLUCOSE, CAPILLARY
Glucose-Capillary: 122 mg/dL — ABNORMAL HIGH (ref 70–99)
Glucose-Capillary: 121 mg/dL — ABNORMAL HIGH (ref 70–99)
Glucose-Capillary: 56 mg/dL — ABNORMAL LOW (ref 70–99)

## 2012-01-23 LAB — HAPTOGLOBIN: Haptoglobin: <25 mg/dL — ABNORMAL LOW (ref 45–215)

## 2012-01-23 LAB — HEMOGLOBIN AND HEMATOCRIT, BLOOD: Hemoglobin: 8.5 g/dL — ABNORMAL LOW (ref 12.0–15.0)

## 2012-01-23 LAB — LIPID PANEL
HDL: 50 mg/dL (ref >39)
LDL Cholesterol: 40 mg/dL (ref 0–99)
Total CHOL/HDL Ratio: 2.1 RATIO
Triglycerides: 84 mg/dL (ref <150)

## 2012-01-23 LAB — LACTIC ACID, PLASMA: Lactic Acid, Venous: 1 mmol/L (ref 0.5–2.2)

## 2012-01-23 LAB — HEMOGLOBIN A1C: Hgb A1c MFr Bld: 6.4 % — ABNORMAL HIGH (ref <5.7)

## 2012-01-23 MED ORDER — GLUCOSE 40 % PO GEL: 1.0000 | ORAL | Status: DC | PRN

## 2012-01-23 MED ORDER — ACETAMINOPHEN 325 MG PO TABS
650.0000 mg | ORAL_TABLET | Freq: Four times a day (QID) | ORAL | Status: DC | PRN
  Administered 2012-01-23 – 2012-01-26 (×5): 650 mg via ORAL
  Filled 2012-01-23 (×5): qty 2

## 2012-01-23 MED ORDER — GLUCOSE 40 % PO GEL
ORAL | Status: AC
  Administered 2012-01-23: 37.5 g
  Filled 2012-01-23: qty 1

## 2012-01-23 NOTE — Progress Notes (Signed)
Pt temp was on 100-100.5 while on blood transfusion. No adverse reaction noted. Merdis Delay NP was notified and ordered tylenol 650 mg po every 6hrs prn. Will continue to monitor.

## 2012-01-23 NOTE — Progress Notes (Signed)
  Echocardiogram 2D Echocardiogram has been performed.  Fausto Sampedro 01/23/2012, 2:04 PM

## 2012-01-23 NOTE — Evaluation (Signed)
Physical Therapy Evaluation Patient Details Name: Shannon Charles MRN: [id number] DOB: January 09, 1928 Today's Date: 01/23/2012 Time: 1191-4782 PT Time Calculation (min): 30 min  PT Assessment / Plan / Recommendation Clinical Impression  78 yo adm for ?TIA and currently with very limited mobility due to back pain, generalized weakness, and anemia. Will benefit from PT to maximize safety with mobility prior to ultimate d/c home with spouse.    PT Assessment  Patient needs continued PT services    Follow Up Recommendations  Post acute inpatient    Does the patient have the potential to tolerate intense rehabilitation   No, Recommend SNF  Barriers to Discharge Decreased caregiver support husband also in his 35s and reports health problems and cannot physically assist pt very much    Equipment Recommendations  None recommended by PT    Recommendations for Other Services OT consult   Frequency Min 3X/week    Precautions / Restrictions Precautions Precautions: Fall   Pertinent Vitals/Pain Pt restless and wriggling in pain; grimacing; unable to rate; RN aware and cannot provide additional pain medicine until 1800      Mobility  Bed Mobility Bed Mobility: Rolling Left;Left Sidelying to Sit;Sitting - Scoot to Edge of Bed;Sit to Sidelying Left;Scooting to [facility] Rolling Left: 5: Supervision;With rail Left Sidelying to Sit: 4: Min assist;With rails;HOB flat Sitting - Scoot to Edge of Bed: 4: Min guard Sit to Sidelying Left: 4: Min guard;With rail;HOB flat Scooting to HOB: 1: +2 Total assist Scooting to [facility]: Patient Percentage: 10% Details for Bed Mobility Assistance: pt required cues for bending knees to incr ease of movement and decr strain on her back; pt impulsive due to need to use bathroom and then when eager to get off BSC to lie down due to pain Transfers Transfers: Sit to Stand;Stand to Sit;Stand Pivot Transfers Sit to Stand: 3: Mod assist;With upper extremity assist;From bed;From  chair/3-in-1 Stand to Sit: 3: Mod assist;With upper extremity assist;To bed;To chair/3-in-1 Stand Pivot Transfers: 3: Mod assist;With armrests Details for Transfer Assistance: attempted to stand with RW and pt restless and unable to follow directions for safe use (reaching across RW to arm of BSC), therefore removed and performed stand-pivot without device; stood a total of 3 times without RW (transfers and pericare by RN) Ambulation/Gait Ambulation/Gait Assistance: Not tested (comment)    Shoulder Instructions     Exercises     PT Diagnosis: Difficulty walking;Generalized weakness;Acute pain  PT Problem List: Decreased strength;Decreased activity tolerance;Decreased balance;Decreased mobility;Decreased cognition;Decreased knowledge of use of DME;Decreased safety awareness;Obesity;Pain PT Treatment Interventions: DME instruction;Gait training;Functional mobility training;Therapeutic activities;Therapeutic exercise;Cognitive remediation;Patient/family education   PT Goals Acute Rehab PT Goals PT Goal Formulation: With patient/family Time For Goal Achievement: 02/06/12 Potential to Achieve Goals: Good Pt will Roll Supine to Left Side: with supervision PT Goal: Rolling Supine to Left Side - Progress: Goal set today Pt will go Supine/Side to Sit: with supervision;with HOB 0 degrees PT Goal: Supine/Side to Sit - Progress: Goal set today Pt will Sit at Edge of Bed: with supervision;3-5 min;with unilateral upper extremity support PT Goal: Sit at Edge Of Bed - Progress: Goal set today Pt will go Sit to Supine/Side: with supervision;with HOB 0 degrees PT Goal: Sit to Supine/Side - Progress: Goal set today Pt will go Sit to Stand: with min assist;with upper extremity assist PT Goal: Sit to Stand - Progress: Goal set today Pt will go Stand to Sit: with min assist;with upper extremity assist PT Goal: Stand to Sit - Progress:  Goal set today Pt will Ambulate: 16 - 50 feet;with min assist;with least  restrictive assistive device PT Goal: Ambulate - Progress: Goal set today Pt will Perform Home Exercise Program: with supervision, verbal cues required/provided PT Goal: Perform Home Exercise Program - Progress: Goal set today  Visit Information  Last PT Received On: 01/23/12 Assistance Needed: +2    Subjective Data  Subjective: Oh...oh...oh.   Patient Stated Goal: pt unable due to cognition & pain; husband wants pt stronger and more mobile   Prior Functioning  Home Living Lives With: Spouse Available Help at Discharge: Family;Available 24 hours/day (husband reports he has health issues) Type of Home: House Home Access: Ramped entrance Home Layout: One level Bathroom Shower/Tub: Other (comment) (does not shower for safety reasons; sponge bathes) Bathroom Toilet: Standard Bathroom Accessibility: Yes How Accessible: Accessible via walker Home Adaptive Equipment: Bedside commode/3-in-1;Walker - four wheeled;Walker - rolling Prior Function Level of Independence: Needs assistance Needs Assistance: Bathing;Dressing;Toileting;Gait;Transfers (recent decline (past 4 weeks per husband)) Able to Take Stairs?: No Communication Communication: Other (comment) (breathless with pain)    Cognition  Overall Cognitive Status: History of cognitive impairments - at baseline Arousal/Alertness: Awake/alert Orientation Level: Disoriented to;Time Behavior During Session: Restless    Extremity/Trunk Assessment Right Lower Extremity Assessment RLE ROM/Strength/Tone: Deficits RLE ROM/Strength/Tone Deficits: grossly assessed as pt wriggling in pain due to back pain; required assist to stand and unable to stand fully upright; grossly 3+-4/5 Left Lower Extremity Assessment LLE ROM/Strength/Tone: Deficits LLE ROM/Strength/Tone Deficits: grossly assessed as pt wriggling in pain due to back pain; required assist to stand and unable to stand fully upright; grossly 3+-4/5   Balance Balance Balance Assessed:  Yes Static Sitting Balance Static Sitting - Balance Support: Bilateral upper extremity supported;Feet supported Static Sitting - Level of Assistance: 3: Mod assist Static Sitting - Comment/# of Minutes: up to mod assist because pt likes to lean posteriorly (at EOB and on BSC) to relieve back pain; pt impulsively moves between sitting upright and throwing herself backwards Static Standing Balance Static Standing - Balance Support: Bilateral upper extremity supported Static Standing - Level of Assistance: 3: Mod assist Static Standing - Comment/# of Minutes: never fully extends hips/knees for full upright  End of Session PT - End of Session Equipment Utilized During Treatment: Gait belt Activity Tolerance: Patient limited by pain Patient left: in bed;with call bell/phone within reach;with bed alarm set;with family/visitor present Nurse Communication: Mobility status  GP Functional Assessment Tool Used: clinical observation Functional Limitation: Mobility: Walking and moving around Mobility: Walking and Moving Around Current Status ([id number]): At least 40 percent but less than 60 percent impaired, limited or restricted Mobility: Walking and Moving Around Goal Status [phone]): At least 1 percent but less than 20 percent impaired, limited or restricted   Alim Cattell 01/23/2012, 3:33 PM  Pager [phone]

## 2012-01-23 NOTE — Clinical Social Work Note (Signed)
Clinical Social Work  CSW learned of pt's possible need for SNF during progression. PT/OT evals are pending. Evaluations are needed for insurance approval. Assessment to follow. Please call with any urgent needs. CSW to follow.   Dede Query, MSW, Theresia Majors [phone]

## 2012-01-23 NOTE — Progress Notes (Signed)
[facility] PROGRESS NOTE  Shannon Charles FAO:[id number] DOB: Jun 20, 1927 DOA: 01/22/2012 PCP: Romero Belling, MD  Assessment/Plan: TIA versus CVA with mild asymmetry of foot strength: Patient has risk factors of HTN, HLD, DM.  - Continue to monitor on telemetry.  No red flags reported. - MRI: Without acute abnormality - Carotid duplex shows no evidence of significant internal carotid artery stenosis - ECHO shows EF of 65-70% with grade 1 diastolic dysfunction - A1c is 6.4 - Lipid panel reviewed. - Hold ASA given GIB  - PT evaluation reviewed. No equipment recommendations. Recommended SNF on d/c  Symptomatic anemia with tachycardia and dypsnea. Likely due to GI blood loss with upper more likely given melena. DDx includes gastritis, PUD, AVM, portal hypertensive gastropathy  - Tx 2 units PRBC (After 1 unit hgb at 7.5)  - Will need GI follow up as outpatient. - UTI may be contributing.  Will continue Ceftriaxone  Tachycardia and tachypnea: Most likely due to pain, GIB and symptomatic anemia.  No evidence of wheeze or rales on exam and CXR negative for pneumonia. Restrictive lung disease likely contributing.  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 Ammonia level improved from prior, but at risk for SBP which may have contributed to weakness and fall last night.  - Continue rifaximin  - Continue lasix  - Restart lower dose lactulose  - Abd [place] ordered initially which showed small to moderate volume of scattered abd. ascities  UTI: Positive nitrites  - U/C pending but has > 100,000 cfu of E coli.  - continue Ceftriaxone. WBC trending down  DM - Continue current regimen -stable currently.  HTN/HLD. Blood pressure stable to elevated  - Continue statin  - Patient' BP is diet controlled.   CKD with mild AKI: May be prerenal currently trending down. Will reassess next am.  Chronic pain, currently uncontrolled:  Continue elavil, oxycodone prn   Dementia, exacerbated by acute infection, possible TIA: Continue memantine and donepezil   Depression/anxiety:  -Stable, continue zoloft   Hypothyroidism:  -Stable, continue synthroid    Code Status: DNR Family Communication: Husband at bedside  Disposition Plan: Pending clinical improvement. Likely home in 1-2 days.  Will change antibiotics to oral regimen once sensitivities come back   Consultants:  none  Procedures:  MRI, US abdomen, chest x ray, CT head  Antibiotics:  Continue current antibiotic regimen.  HPI/Subjective: No new complaints today.  Husband states that patient is breathing near her baseline.  Objective: Filed Vitals:   01/23/12 0600 01/23/12 1000 01/23/12 1400 01/23/12 1545  BP: 132/65 137/63 134/66 137/59  Pulse: 105 102 102 106  Temp: 100.7 F (38.2 C) 98 F (36.7 C) 98.9 F (37.2 C) 99 F (37.2 C)  TempSrc:  Oral Oral Oral  Resp: 26 24 22 22   Height:      Weight:      SpO2: 99% 100% 99%     Intake/Output Summary (Last 24 hours) at 01/23/12 1615 Last data filed at 01/23/12 1600  Gross per 24 hour  Intake 1952.5 ml  Output    300 ml  Net 1652.5 ml   Filed Weights   01/22/12 1432  Weight: 92.987 kg (205 lb)    Exam:   General:  Pt in NAD, Alert and Awake  Cardiovascular: RRR, No MRG  Respiratory: Breath sounds BL, no wheezes  Abdomen: soft, NT  Data Reviewed: Basic Metabolic Panel:  Lab 01/23/12 6962  01/22/12 1446  NA 144 142  K 3.7 4.6  CL 106 105  CO2 29 27  GLUCOSE 129* 232*  BUN 25* 26*  CREATININE 1.33* 1.41*  CALCIUM 8.6 8.5  MG -- --  PHOS -- --   Liver Function Tests:  Lab 01/22/12 1646  AST 38*  ALT 16  ALKPHOS 102  BILITOT 0.6  PROT 6.9  ALBUMIN 2.8*   No results found for this basename: LIPASE:5,AMYLASE:5 in the last 168 hours  Lab 01/22/12 1645  AMMONIA 53   CBC:  Lab 01/23/12 0600 01/22/12 1646 01/22/12 1446  WBC 10.0 10.6* 11.1*  NEUTROABS -- --  9.0*  HGB 7.5* 6.2* 6.6*  HCT 24.6* 21.2* 22.6*  MCV 87.2 86.9 86.6  PLT 138* 152 170   Cardiac Enzymes:  Lab 01/23/12 0600 01/23/12 0025 01/22/12 1931 01/22/12 1447  CKTOTAL -- -- -- --  CKMB -- -- -- --  CKMBINDEX -- -- -- --  TROPONINI <0.30 <0.30 <0.30 <0.30   BNP (last 3 results)  Basename 12/05/11 1354 11/07/11 1531 07/02/11 1517  PROBNP 132.0* 184.7 52.0   CBG:  Lab 01/23/12 1125 01/23/12 0650 01/22/12 2351 01/22/12 1922 01/22/12 1540  GLUCAP 161* 121* 122* 122* 225*    Recent Results (from the past 240 hour(s))  URINE CULTURE     Status: Normal (Preliminary result)   Collection Time   01/22/12  4:32 PM      Component Value Range Status Comment   Specimen Description URINE, CATHETERIZED   Final    Special Requests NONE   Final    Culture  Setup Time 01/22/2012 19:13   Final    Colony Count >=100,000 COLONIES/ML   Final    Culture ESCHERICHIA COLI   Final    Report Status PENDING   Incomplete      Studies: Dg Chest 1 View  01/22/2012  *RADIOLOGY REPORT*  Clinical Data: Altered mental status.  Shortness of breath.  CHEST - 1 VIEW  Comparison: Chest x-ray 11/07/2011.  Findings: Lung volumes are very low, and there is persistent elevation of the right hemidiaphragm (noted on prior).  Even allowing for these low lung volumes, there is blunting of the right costophrenic sulcus, suggesting a small right pleural effusion. Crowding of the pulmonary vasculature is noted, without frank pulmonary edema.  Bibasilar opacities are favored to represent subsegmental atelectasis, however, underlying airspace consolidation is difficult to exclude.  Mild cardiomegaly is unchanged. The patient is rotated to the right on today's exam, resulting in distortion of the mediastinal contours and reduced diagnostic sensitivity and specificity for mediastinal pathology. Atherosclerosis in the thoracic aorta.  Calcified mediastinal and bilateral hilar lymph nodes suggesting old granulomatous  disease.  IMPRESSION: 1.  Low lung volumes with probable bibasilar subsegmental atelectasis and a small right pleural effusion. 2.  Cardiomegaly with pulmonary venous congestion, but no frank pulmonary edema at this time. 3.  Atherosclerosis. 4.  Sequelae of old granulomatous disease.   Original Report Authenticated By: Florencia Reasons, M.D.    Ct Head Wo Contrast  01/22/2012  *RADIOLOGY REPORT*  Clinical Data: Facial droop.  CT HEAD WITHOUT CONTRAST  Technique:  Contiguous axial images were obtained from the base of the skull through the vertex without contrast.  Comparison: July 14, 2011.  Findings: Bony calvarium is intact.  No mass effect or midline shift is noted.  Ventricular size is within normal limits.  There is no evidence of mass lesion, hemorrhage or acute infarction.  IMPRESSION: No acute  intracranial abnormality is noted.   Original Report Authenticated By: Venita Sheffield., M.D.    Mr Brain Wo Contrast  01/23/2012  *RADIOLOGY REPORT*  Clinical Data: Altered mental status.  Rule out stroke.  MRI HEAD WITHOUT CONTRAST  Technique:  Multiplanar, multiecho pulse sequences of the brain and surrounding structures were obtained according to standard protocol without intravenous contrast.  Comparison: CT 01/22/2012  Findings: Image quality degraded by a moderate amount of motion. The patient was not able to perform MRI at this time due to inability to hold still.  Generalized atrophy.  Negative for acute infarct. Mild chronic microvascular ischemia in the cerebral white matter.  Small chronic infarct in the right thalamus.  Negative for hemorrhage or mass lesion.  No shift of the midline structures.  Paranasal sinuses are clear.  IMPRESSION: Image quality degraded by motion.  No acute abnormality is identified.   Original Report Authenticated By: Camelia Phenes, M.D.    US Abdomen Complete  01/22/2012  *RADIOLOGY REPORT*  Clinical Data:  Cirrhosis.  Abdominal distention.  COMPLETE ABDOMINAL  ULTRASOUND  Comparison:  Ultrasound 07/15/2011.  Findings:  Gallbladder:  There are some small stones and sludge within the gallbladder.  No wall thickening is identified.  Common bile duct:  Measures 0.5 cm.  Liver:  The liver is shrunken with a nodular contour consistent with cirrhosis.  Small to moderate volume of ascites is present scattered through the abdomen. Sonographer reports right upper quadrant tenderness.  IVC:  Appears normal.  Pancreas:  No focal abnormality seen.  Spleen:  Measures 7.2 cm and appears normal.  Right Kidney:  Measures 9.3 cm and appears normal.  Left Kidney:  Measures 9.8 cm and appears normal.  Abdominal aorta:  No aneurysm identified.  IMPRESSION:  1.  Cirrhosis with a small to moderate volume of scattered abdominal ascites. 2.  Small gallstones without gallbladder wall thickening.   Original Report Authenticated By: Bernadene Bell. D'ALESSIO, M.D.     Scheduled Meds:   . sodium chloride   Intravenous STAT  . amitriptyline  10 mg Oral QHS  . cefTRIAXone (ROCEPHIN)  IV  1 g Intravenous Q24H  . donepezil  10 mg Oral QHS  . furosemide  20 mg Intravenous Once  . furosemide  40 mg Oral TID  . insulin aspart  0-9 Units Subcutaneous TID WC  . insulin aspart  3 Units Subcutaneous TID WC  . insulin glargine  50 Units Subcutaneous Daily  . lactulose  20 g Oral Daily  . latanoprost  1 drop Both Eyes QHS  . levothyroxine  175 mcg Oral QAC breakfast  . memantine  10 mg Oral BID  . rifaximin  200 mg Oral TID  . sertraline  50 mg Oral BID  . simvastatin  40 mg Oral QPM  . sodium chloride  500 mL Intravenous Once  . DISCONTD: insulin NPH  85 Units Subcutaneous BH-[id number]   Continuous Infusions:   Principal Problem:  *TIA (transient ischemic attack) Active Problems:  HYPOTHYROIDISM  HYPERLIPIDEMIA  DEMENTIA  HYPERTENSION  Bell's palsy  Hepatic encephalopathy  Diabetes mellitus  Cirrhosis  Pulmonary edema  Anasarca  Lung disease, restrictive  Anemia, blood  loss    Time spent: > 35 minutes    Penny Pia  [facility] Pager [phone]. If 8PM-8AM, please contact night-coverage at [email or web address], password [facility] 01/23/2012, 4:15 PM  LOS: 1 day

## 2012-01-23 NOTE — Progress Notes (Signed)
Hypoglycemic Event  CBG: 56  Treatment: 15 GM gel, graham cracker with peanut butter and half carton milk  Symptoms: None  Follow-up CBG: Time:2245 CBG Result:85  Possible Reasons for Event: Inadequate meal intake (possible, Patient cannot remember how much dinner she consumed)  Comments/MD notified:NP, Schorr notified.     Shannon Charles E  Remember to initiate Hypoglycemia Order Set & complete

## 2012-01-23 NOTE — ED Provider Notes (Signed)
  I performed a history and physical examination of Shannon Charles and discussed her management with Dr. Gregary Cromer.  I agree with the history, physical, assessment, and plan of care, with the following exceptions: None  This patient presents with concerns of facial droop, now apparently back to baseline.  However, the patient's recent diagnosis of Bell's palsy, complicating both the episode, and current evaluation.  The patient's airway is intact, but she is inconsistently interactive.  Given the concern for TIA and possible altered mental status, the patient was admitted for further evaluation and management.  I saw the ECG, relevant labs and studies - I agree with the interpretation.   Shannon Jarvis, MD 01/23/12 1556

## 2012-01-23 NOTE — Progress Notes (Signed)
SLP Cancellation Note  Patient Details Name: Shannon Charles MRN: [id number] DOB: 12-25-1927   Cancelled treatment:        Attempted to see patient for Speech/Language evaluation.  Patient is currently gone for Doppler study per RN.  Will return later today if schedule allows.  If not, will f/u 10/31.   Maryjo Rochester T 01/23/2012, 12:38 PM

## 2012-01-23 NOTE — Progress Notes (Signed)
*  PRELIMINARY RESULTS* Vascular Ultrasound Carotid Duplex (Doppler) has been completed.  Preliminary findings: Bilateral:  No evidence of hemodynamically significant internal carotid artery stenosis.   Vertebral artery flow is antegrade.      Farrel Demark, RDMS, RVT 01/23/2012, 12:47 PM

## 2012-01-23 NOTE — Progress Notes (Signed)
Pt unable to void, but strongly felt urge and was uncomfortable. Bladder scan revealed . Pt attempted to void x2 and was unable. NP, Schorr notified. Order for i/o cath received. I/O cath drained of clear, yellow, odorless urine. Will cont to monitor patient.

## 2012-01-23 NOTE — Evaluation (Signed)
Speech Language Pathology Evaluation Patient Details Name: Shannon Charles MRN: [id number] DOB: 10-21-1927 Today's Date: 01/23/2012 Time: 4098-1191 SLP Time Calculation (min): 39 min  Problem List:  Patient Active Problem List  Diagnosis  . HYPOTHYROIDISM  . DIABETES MELLITUS, TYPE I  . HYPERLIPIDEMIA  . DEMENTIA  . DEPRESSION  . HYPERTENSION  . FATTY LIVER DISEASE  . RENAL INSUFFICIENCY  . OSTEOARTHRITIS  . BACK PAIN, LUMBAR  . DIAPHORESIS  . Edema  . HEADACHE  . SHORTNESS OF BREATH  . PERSONAL HISTORY OF COLONIC POLYPS  . Abnormal ECG  . Bell's palsy  . Foot ulcer, left  . Cough  . Gynecological examination  . Hepatic encephalopathy  . ARF (acute renal failure)  . UTI (lower urinary tract infection)  . Constipation  . Hypokalemia  . Diabetes mellitus  . Cirrhosis  . Pulmonary edema  . Anasarca  . Lung disease, restrictive  . TIA (transient ischemic attack)  . Anemia, blood loss   Past Medical History:  Past Medical History  Diagnosis Date  . DEMENTIA 08/23/2006  . DEPRESSION 08/23/2006  . DIABETES MELLITUS, TYPE I 08/23/2006  . HYPERLIPIDEMIA 08/23/2006  . HYPERTENSION 08/23/2006  . HYPOTHYROIDISM 08/23/2006  . OSTEOARTHRITIS 08/23/2006  . RENAL INSUFFICIENCY 08/23/2006  . Personal history of colonic polyps 04/29/2007  . Lung disease, restrictive     Mild, by pulmonary function test in October 2006 with mildly reduce DLCO  . Pain in limb   . Chronic pain syndrome   . Lack of coordination   . Lumbago   . Primary localized osteoarthrosis, lower leg   . Disorders of bursae and tendons in shoulder region, unspecified   . Bell's palsy     per Husband  . Cirrhosis    Past Surgical History:  Past Surgical History  Procedure Date  . Total abdominal hysterectomy   . Colonoscopy w/ polypectomy    HPI:  The patient is an 69-yo female with HTN, HLD, DM, CKD, cirrhosis 2/2 NASH with hepatic encephalopathy, chronic pain, hypothyroidism, restrictive lung disease,  dementia who presents with weakness and sudden onset facial droop.  She has not been well for many months, not since her last hospitalization in 06/2011.  She has been weak, confused, and generally debilitated.  A few weeks ago, she had some stools that had dark blood in them which were followed by stools that were tarry black.  This lasted for several days.  She then developed frequent watery diarrhea so her family stopped her lactulose.  Her stools are now soft and brown, but she has become progressively weaker and confused.      Assessment / Plan / Recommendation Clinical Impression  Patient appears to be a baseline level of cognitive function, with premorbid dementia.  MRI was negative for acute changes.  Husband at bedside agrees, and states he wants her to focus on mobility at this point.       SLP Assessment  Patient does not need any further Speech Lanaguage Pathology Services    Follow Up Recommendations  None    Frequency and Duration        Pertinent Vitals/Pain 8/10  RN aware   SLP Goals  SLP Goals Progress/Goals/Alternative treatment plan discussed with pt/caregiver and they: Agree  SLP Evaluation Prior Functioning  Cognitive/Linguistic Baseline: Baseline deficits Baseline deficit details: Dementia at baseline Type of Home: House Lives With: Spouse Available Help at Discharge: Family;Available 24 hours/day Vocation: Retired   IT consultant  Overall Cognitive Status: Impaired at baseline  Arousal/Alertness: Awake/alert Orientation Level: Oriented to person;Oriented to place (Stated it's the 13 of December, 2013) Memory: Impaired Memory Impairment:  (Impaired at baseline) Awareness: Appears intact Problem Solving: Appears intact    Comprehension  Auditory Comprehension Overall Auditory Comprehension: Appears within functional limits for tasks assessed Yes/No Questions: Within Functional Limits Commands: Within Functional Limits Conversation: Simple Interfering  Components: Attention;Pain EffectiveTechniques: Repetition Reading Comprehension Reading Status: Within funtional limits    Expression Expression Primary Mode of Expression: Verbal Verbal Expression Overall Verbal Expression: Appears within functional limits for tasks assessed Naming: No impairment Interfering Components: Premorbid deficit (Pain) Written Expression Written Expression: Not tested   Oral / Motor Oral Motor/Sensory Function Overall Oral Motor/Sensory Function: Impaired Labial ROM: Reduced right Labial Symmetry: Abnormal symmetry right Labial Strength: Reduced Motor Speech Overall Motor Speech: Appears within functional limits for tasks assessed Respiration: Impaired (SOB; Speaks 1-2 words per breath) Level of Impairment: Phrase Phonation: Normal Resonance: Within functional limits Articulation: Within functional limitis Intelligibility: Intelligible Motor Planning: Witnin functional limits Motor Speech Errors: Not applicable   GO     Shannon Charles T 01/23/2012, 4:26 PM

## 2012-01-24 ENCOUNTER — Inpatient Hospital Stay (HOSPITAL_COMMUNITY): Payer: Medicare Other

## 2012-01-24 DIAGNOSIS — I10 Essential (primary) hypertension: Secondary | ICD-10-CM

## 2012-01-24 DIAGNOSIS — E876 Hypokalemia: Secondary | ICD-10-CM

## 2012-01-24 DIAGNOSIS — D649 Anemia, unspecified: Secondary | ICD-10-CM

## 2012-01-24 LAB — TYPE AND SCREEN: Unit division: 0

## 2012-01-24 LAB — CBC
HCT: 27.7 % — ABNORMAL LOW (ref 36.0–46.0)
MCH: 26.5 pg (ref 26.0–34.0)
MCHC: 31 g/dL (ref 30.0–36.0)
MCV: 85.5 fL (ref 78.0–100.0)
Platelets: 110 10*3/uL — ABNORMAL LOW (ref 150–400)
RDW: 18.3 % — ABNORMAL HIGH (ref 11.5–15.5)
WBC: 11.8 10*3/uL — ABNORMAL HIGH (ref 4.0–10.5)

## 2012-01-24 LAB — URINE CULTURE: Colony Count: >=100000

## 2012-01-24 LAB — BASIC METABOLIC PANEL
BUN: 20 mg/dL (ref 6–23)
Calcium: 8.3 mg/dL — ABNORMAL LOW (ref 8.4–10.5)
Chloride: 103 mEq/L (ref 96–112)
Creatinine, Ser: 1.14 mg/dL — ABNORMAL HIGH (ref 0.50–1.10)
GFR calc Af Amer: 50 mL/min — ABNORMAL LOW (ref >90)
GFR calc non Af Amer: 44 mL/min — ABNORMAL LOW (ref >90)

## 2012-01-24 LAB — GLUCOSE, CAPILLARY
Glucose-Capillary: 70 mg/dL (ref 70–99)
Glucose-Capillary: 89 mg/dL (ref 70–99)

## 2012-01-24 MED ORDER — SODIUM CHLORIDE 0.9 % IV SOLN
1250.0000 mg | INTRAVENOUS | Status: DC
  Administered 2012-01-24 – 2012-01-25 (×2): 1250 mg via INTRAVENOUS
  Filled 2012-01-24 (×4): qty 1250

## 2012-01-24 MED ORDER — PIPERACILLIN-TAZOBACTAM 3.375 G IVPB
3.3750 g | Freq: Three times a day (TID) | INTRAVENOUS | Status: DC
  Administered 2012-01-24 – 2012-01-26 (×6): 3.375 g via INTRAVENOUS
  Filled 2012-01-24 (×8): qty 50

## 2012-01-24 MED ORDER — FLAVOXATE HCL 100 MG PO TABS
100.0000 mg | ORAL_TABLET | Freq: Three times a day (TID) | ORAL | Status: DC | PRN
  Administered 2012-01-24 – 2012-01-27 (×4): 100 mg via ORAL
  Filled 2012-01-24 (×7): qty 1

## 2012-01-24 MED ORDER — SODIUM CHLORIDE 0.9 % IV SOLN
INTRAVENOUS | Status: DC
  Administered 2012-01-25 – 2012-01-27 (×3): via INTRAVENOUS

## 2012-01-24 MED ORDER — PHENAZOPYRIDINE HCL 100 MG PO TABS: 100.0000 mg | ORAL_TABLET | Freq: Three times a day (TID) | ORAL | Status: DC

## 2012-01-24 MED ORDER — INSULIN GLARGINE 100 UNIT/ML ~~LOC~~ SOLN
35.0000 [IU] | Freq: Every day | SUBCUTANEOUS | Status: DC
  Administered 2012-01-25 – 2012-02-01 (×7): 35 [IU] via SUBCUTANEOUS

## 2012-01-24 MED ORDER — METOPROLOL TARTRATE 12.5 MG HALF TABLET
12.5000 mg | ORAL_TABLET | Freq: Two times a day (BID) | ORAL | Status: DC
  Administered 2012-01-24 – 2012-02-01 (×15): 12.5 mg via ORAL
  Filled 2012-01-24 (×18): qty 1

## 2012-01-24 NOTE — Progress Notes (Addendum)
Patient continued throughout the night to have very frequent sensations to urinate, but nothing would come out. Bladder scans revealed less than . Pt very uncomfortable all night long, and secondary to her dementia had a difficult time understanding the pain was from a UTI. NP, Schorr notified. PRN medication received for bladder spasms. Will cont to monitor.

## 2012-01-24 NOTE — Progress Notes (Signed)
Inpatient Diabetes Program Recommendations  AACE/ADA: New Consensus Statement on Inpatient Glycemic Control (2013)  Target Ranges:  Prepandial:   less than 140 mg/dL      Peak postprandial:   less than 180 mg/dL (1-2 hours)      Critically ill patients:  140 - 180 mg/dL   Reason for Visit: Results for Shannon Charles, Shannon Charles (MRN [id number]) as of 01/24/2012 10:07  Ref. Range 01/23/2012 22:17 01/23/2012 22:50 01/24/2012 04:55 01/24/2012 06:57 01/24/2012 07:55  Glucose-Capillary Latest Range: 70-99 mg/dL 56 (L) 85  70    Please decrease Lantus to 35 units daily.  Will follow.

## 2012-01-24 NOTE — Progress Notes (Signed)
Physical Therapy Treatment Patient Details Name: Shannon Charles MRN: [id number] DOB: 1927/04/01 Today's Date: 01/24/2012 Time: 7829-5621 PT Time Calculation (min): 12 min  PT Assessment / Plan / Recommendation Comments on Treatment Session  Patient severly limited by pain this session. Patient attempted to sit EOB but unable to maintain balacne without support, unable to sit upright >10 secs without falling back. Patient returned back to supine and positioned comfortably. RN made aware    Follow Up Recommendations  Post acute inpatient     Does the patient have the potential to tolerate intense rehabilitation  No, Recommend SNF  Barriers to Discharge        Equipment Recommendations  None recommended by PT    Recommendations for Other Services    Frequency Min 3X/week   Plan Discharge plan remains appropriate;Frequency remains appropriate    Precautions / Restrictions Precautions Precautions: Fall   Pertinent Vitals/Pain 8/10 back pain. RN aware    Mobility  Bed Mobility Bed Mobility: Right Sidelying to Sit;Rolling Right;Sit to Sidelying Right Rolling Right: 3: Mod assist Right Sidelying to Sit: 1: +2 Total assist;With rails Right Sidelying to Sit: Patient Percentage: 40% Sitting - Scoot to Edge of Bed: 3: Mod assist;Other (comment) (with chuck pad) Sit to Sidelying Right: 1: +2 Total assist Sit to Sidelying Right: Patient Percentage: 40% Details for Bed Mobility Assistance: Patient required A to position LEs off of bed and to elevate shoulders into upright positioning. Patient unable to left legs into bed with lying and required A for positioning back into bed. Patient crying out in pain Transfers Transfers: Not assessed Ambulation/Gait Ambulation/Gait Assistance: Not tested (comment)    Exercises     PT Diagnosis:    PT Problem List:   PT Treatment Interventions:     PT Goals Acute Rehab PT Goals PT Goal: Rolling Supine to Left Side - Progress: Not  progressing PT Goal: Supine/Side to Sit - Progress: Not progressing PT Goal: Sit at [facility] Of Bed - Progress: Not progressing  Visit Information  Last PT Received On: 01/24/12 Assistance Needed: +2 PT/OT Co-Evaluation/Treatment: Yes    Subjective Data      Cognition  Overall Cognitive Status: History of cognitive impairments - at baseline Arousal/Alertness: Awake/alert Orientation Level: Appears intact for tasks assessed Behavior During Session: Restless (painful)    Balance     End of Session PT - End of Session Activity Tolerance: Patient limited by pain Patient left: in bed;with call bell/phone within reach;with bed alarm set Nurse Communication: Mobility status   GP     Fredrich Birks 01/24/2012, 2:17 PM 01/24/2012 Fredrich Birks PTA [phone] pager [phone] office

## 2012-01-24 NOTE — Progress Notes (Signed)
ANTIBIOTIC CONSULT NOTE - INITIAL  Pharmacy Consult for vancomycin and zosyn Indication: pneumonia  No Known Allergies  Patient Measurements: Height: 5\' 2"  (157.5 cm) Weight: 205 lb (92.987 kg) IBW/kg (Calculated) : 50.1  Adjusted Body Weight:   Vital Signs: Temp: 98.2 F (36.8 C) (10/31 0600) BP: 138/55 mmHg (10/31 0600) Pulse Rate: 104  (10/31 0600) Intake/Output from previous day: 10/30 0701 - 10/31 0700 In: 600 [I.V.:250; Blood:350] Out: 725 [Urine:725] Intake/Output from this shift:    Labs:  Basename 01/24/12 0455 01/23/12 1944 01/23/12 0600 01/22/12 1646 01/22/12 1446  WBC 11.8* -- 10.0 10.6* --  HGB 8.6* 8.5* 7.5* -- --  PLT 110* -- 138* 152 --  LABCREA -- -- -- -- --  CREATININE 1.14* -- 1.33* -- 1.41*   Estimated Creatinine Clearance: 40.4 ml/min (by C-G formula based on Cr of 1.14). No results found for this basename: VANCOTROUGH:2,VANCOPEAK:2,VANCORANDOM:2,GENTTROUGH:2,GENTPEAK:2,GENTRANDOM:2,TOBRATROUGH:2,TOBRAPEAK:2,TOBRARND:2,AMIKACINPEAK:2,AMIKACINTROU:2,AMIKACIN:2, in the last 72 hours   Microbiology: Recent Results (from the past 720 hour(s))  URINE CULTURE     Status: Normal (Preliminary result)   Collection Time   01/22/12  4:32 PM      Component Value Range Status Comment   Specimen Description URINE, CATHETERIZED   Final    Special Requests NONE   Final    Culture  Setup Time 01/22/2012 19:13   Final    Colony Count >=100,000 COLONIES/ML   Final    Culture ESCHERICHIA COLI   Final    Report Status PENDING   Incomplete     Medical History: Past Medical History  Diagnosis Date  . DEMENTIA 08/23/2006  . DEPRESSION 08/23/2006  . DIABETES MELLITUS, TYPE I 08/23/2006  . HYPERLIPIDEMIA 08/23/2006  . HYPERTENSION 08/23/2006  . HYPOTHYROIDISM 08/23/2006  . OSTEOARTHRITIS 08/23/2006  . RENAL INSUFFICIENCY 08/23/2006  . Personal history of colonic polyps 04/29/2007  . Lung disease, restrictive     Mild, by pulmonary function test in October 2006 with  mildly reduce DLCO  . Pain in limb   . Chronic pain syndrome   . Lack of coordination   . Lumbago   . Primary localized osteoarthrosis, lower leg   . Disorders of bursae and tendons in shoulder region, unspecified   . Bell's palsy     per Husband  . Cirrhosis     Medications:  Scheduled:    . sodium chloride   Intravenous STAT  . amitriptyline  10 mg Oral QHS  . dextrose      . donepezil  10 mg Oral QHS  . furosemide  40 mg Oral TID  . insulin aspart  0-9 Units Subcutaneous TID WC  . insulin aspart  3 Units Subcutaneous TID WC  . insulin glargine  50 Units Subcutaneous Daily  . lactulose  20 g Oral Daily  . latanoprost  1 drop Both Eyes QHS  . levothyroxine  175 mcg Oral QAC breakfast  . memantine  10 mg Oral BID  . sertraline  50 mg Oral BID  . simvastatin  40 mg Oral QPM  . DISCONTD: cefTRIAXone (ROCEPHIN)  IV  1 g Intravenous Q24H  . DISCONTD: phenazopyridine  100 mg Oral TID WC  . DISCONTD: rifaximin  200 mg Oral TID   Infusions:   Assessment: 78 yo female with pneumonia will be started on vancomycin and zosyn. CrCl ~40. Wt: 93 kg  Goal of Therapy:  Vancomycin trough level 15-20 mcg/ml  Plan:  1) Zosyn 3.375g iv q8h (4hr infusion) and vancomycin 1250mg  iv q24h 2) Monitor renal  function and culture & sensitivity 3) Check vancomycin trough when it's appropriate.  Leandrew Keech, Tsz-Yin 01/24/2012,7:29 AM

## 2012-01-24 NOTE — Evaluation (Signed)
Occupational Therapy Evaluation Patient Details Name: Shannon Charles MRN: [id number] DOB: 20-Jul-1927 Today's Date: 01/24/2012 Time: 4098-1191 OT Time Calculation (min): 14 min  OT Assessment / Plan / Recommendation Clinical Impression  Pt admitted for TIA vs CVA. MRI: Without acute abnormality.  Pt significantly limited by back pain and is unable to tolerate OOB activity at this time. Will continue to follow acutely. Recommending SNF at d/c.    OT Assessment  Patient needs continued OT Services    Follow Up Recommendations  Skilled nursing facility    Barriers to Discharge      Equipment Recommendations  None recommended by OT    Recommendations for Other Services    Frequency  Min 2X/week    Precautions / Restrictions Precautions Precautions: Fall   Pertinent Vitals/Pain See vitals    ADL  Eating/Feeding: Performed;Modified independent Where Assessed - Eating/Feeding: Bed level Upper Body Bathing: Simulated;Maximal assistance Where Assessed - Upper Body Bathing: Supported sitting Lower Body Bathing: Simulated;+1 Total assistance Where Assessed - Lower Body Bathing: Supported sitting Upper Body Dressing: Simulated;Maximal assistance Where Assessed - Upper Body Dressing: Supported sitting Lower Body Dressing: Performed;+1 Total assistance Where Assessed - Lower Body Dressing: Supported sitting Transfers/Ambulation Related to ADLs: Pt unable to tolerate OOB transfer due to pain. ADL Comments: Pt significantly limited by back pain and unable to tolerate sitting EOB.    OT Diagnosis: Generalized weakness;Acute pain  OT Problem List: Decreased activity tolerance;Decreased strength;Impaired balance (sitting and/or standing);Pain;Decreased knowledge of use of DME or AE OT Treatment Interventions: Self-care/ADL training;DME and/or AE instruction;Therapeutic activities;Patient/family education;Balance training   OT Goals Acute Rehab OT Goals OT Goal Formulation: With  patient Time For Goal Achievement: 24-Feb-2012 Potential to Achieve Goals: Good ADL Goals Pt Will Perform Grooming: with supervision;Sitting at sink;Standing at sink ADL Goal: Grooming - Progress: Goal set today Pt Will Perform Upper Body Bathing: with supervision;Sitting, chair;Sitting, edge of bed;Unsupported ADL Goal: Upper Body Bathing - Progress: Goal set today Pt Will Perform Lower Body Bathing: with supervision;Sit to stand from chair;Sit to stand from bed;Unsupported ADL Goal: Lower Body Bathing - Progress: Goal set today Pt Will Perform Upper Body Dressing: with supervision;Sit to stand from chair;Sit to stand from bed;Unsupported ADL Goal: Upper Body Dressing - Progress: Goal set today Pt Will Perform Lower Body Dressing: with supervision;Sit to stand from chair;Sit to stand from bed;Unsupported ADL Goal: Lower Body Dressing - Progress: Goal set today Pt Will Transfer to Toilet: with supervision;Ambulation;with DME;Comfort height toilet ADL Goal: Toilet Transfer - Progress: Goal set today Pt Will Perform Toileting - Clothing Manipulation: with supervision;Sitting on 3-in-1 or toilet;Standing ADL Goal: Toileting - Clothing Manipulation - Progress: Goal set today Pt Will Perform Toileting - Hygiene: with supervision;Sit to stand from 3-in-1/toilet;Standing at 3-in-1/toilet ADL Goal: Toileting - Hygiene - Progress: Goal set today Miscellaneous OT Goals Miscellaneous OT Goal #1: Pt will perform bed mobility with supervision in prep for EOB ADLs. OT Goal: Miscellaneous Goal #1 - Progress: Goal set today  Visit Information  Last OT Received On: 01/24/12 Assistance Needed: +2    Subjective Data  Subjective: "I will try to do whatever you want me to"   Prior Functioning     Home Living Lives With: Spouse Available Help at Discharge: Family;Available 24 hours/day Type of Home: House Home Access: Ramped entrance Home Layout: One level Bathroom Shower/Tub: Other (comment) (sponge  bathes) Bathroom Toilet: Standard Bathroom Accessibility: Yes How Accessible: Accessible via walker Home Adaptive Equipment: Bedside commode/3-in-1;Walker - four wheeled;Walker - rolling  Prior Function Level of Independence: Needs assistance Needs Assistance: Bathing;Dressing;Toileting;Gait;Transfers (for past several weeks) Vocation: Retired Musician: Other (comment) (breathless due to pain)         Vision/Perception     Cognition  Overall Cognitive Status: History of cognitive impairments - at baseline Arousal/Alertness: Awake/alert Orientation Level: Appears intact for tasks assessed Behavior During Session: Restless    Extremity/Trunk Assessment Right Upper Extremity Assessment RUE ROM/Strength/Tone: Unable to fully assess;Due to pain Left Upper Extremity Assessment LUE ROM/Strength/Tone: Unable to fully assess;Due to pain     Mobility Bed Mobility Bed Mobility: Right Sidelying to Sit;Rolling Right;Sit to Sidelying Right Rolling Right: 3: Mod assist Right Sidelying to Sit: 1: +2 Total assist;With rails Right Sidelying to Sit: Patient Percentage: 40% Sitting - Scoot to Edge of Bed: 3: Mod assist;Other (comment) (with chuck pad) Sit to Sidelying Right: 1: +2 Total assist Sit to Sidelying Right: Patient Percentage: 40% Details for Bed Mobility Assistance: Patient required A to position LEs off of bed and to elevate shoulders into upright positioning. Patient unable to left legs into bed with lying and required A for positioning back into bed. Patient crying out in pain Transfers Transfers: Not assessed (pt unable to tolerate due to pain)     Shoulder Instructions     Exercise     Balance Static Sitting Balance Static Sitting - Balance Support: Bilateral upper extremity supported;Feet supported Static Sitting - Level of Assistance: 3: Mod assist Static Sitting - Comment/# of Minutes: Pt sat EOB ~3 min. Impulsively tries to throw herself on her  right side in order to lay back down due to pain.   End of Session OT - End of Session Activity Tolerance: Patient limited by pain Patient left: in bed;with call bell/phone within reach;with bed alarm set;with family/visitor present Nurse Communication: Other (comment) (very painful sitting EOB)  GO    01/24/2012 Cipriano Mile OTR/L Pager [phone] Office [phone]  Cipriano Mile 01/24/2012, 2:52 PM

## 2012-01-24 NOTE — Clinical Social Work Psychosocial (Signed)
     Clinical Social Work Department BRIEF PSYCHOSOCIAL ASSESSMENT 01/24/2012  Patient:  Shannon Charles, Shannon Charles     Account Number:  [id number]     Admit date:  01/22/2012  Clinical Social Worker:  Peggyann Shoals  Date/Time:  01/24/2012 04:13 PM  Referred by:  Physician  Date Referred:  01/23/2012 Referred for  SNF Placement   Other Referral:   Interview type:  Family Other interview type:    PSYCHOSOCIAL DATA Living Status:  HUSBAND Admitted from facility:   Level of care:   Primary support name:  Shannon Charles Primary support relationship to patient:  SPOUSE Degree of support available:   Supportive.    CURRENT CONCERNS Current Concerns  Post-Acute Placement   Other Concerns:    SOCIAL WORK ASSESSMENT / PLAN CSW met with pt and husband to address ocnsult for SNF. CSW introduced herself and explained role of social work. CSW also explained the process of discharging to SNF with [facility].    Pt lives with her husband. Pt has been to a SNF in the past. Pt has supportive grandchildren as well. CSW will initiate SNF search for [facility] and follow up with bed offers. CSW will initiate [employer] prior auth. CSW will continue to follow.   Assessment/plan status:  Psychosocial Support/Ongoing Assessment of Needs Other assessment/ plan:   Information/referral to community resources:   SNF list    PATIENTS/FAMILYS RESPONSE TO PLAN OF CARE: Pt is alert and oriented. Pt and husband were pleasant and agreeable to discharge plan.

## 2012-01-24 NOTE — Progress Notes (Signed)
[facility] PROGRESS NOTE  Shannon Charles ZOX:[id number] DOB: 10-09-1927 DOA: 01/22/2012 PCP: Romero Belling, MD  Assessment/Plan: TIA versus CVA but TIA most likely given negative workup at this juncture: Patient has risk factors of HTN, HLD, DM.  - Continue to monitor on telemetry.  No red flags reported. - CT/MRI: Without acute abnormality - Carotid duplex shows no evidence of significant internal carotid artery stenosis - ECHO shows EF of 65-70% with grade 1 diastolic dysfunction - A1c is 6.4 - Lipid panel reviewed. - Hold ASA given GIB  - PT evaluation reviewed. No equipment recommendations. Recommended SNF on d/c  Symptomatic anemia with tachycardia and dypsnea. Likely due to GI blood loss with upper more likely given melena. DDx includes gastritis, PUD, AVM, portal hypertensive gastropathy  - Tx 2 units PRBC  Hemoglobin after transfusion 8.6. - Will need GI follow up as outpatient. - UTI may be contributing.  Will continue Ceftriaxone  Tachycardia and tachypnea: Most likely due to pain, GIB and symptomatic anemia.  No evidence of wheeze or rales on exam and CXR negative for pneumonia. Restrictive lung disease likely contributing.  - Telemetry  - CE x 3 negative - Wean oxygen as tolerated  - Will obtain repeat EKG 01/24/12 - Place on IVF's and reassess next am. 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 Ammonia level improved from prior, but at risk for SBP which may have contributed to weakness and fall last night.  - Continue rifaximin  - Continue lasix  - Restart lower dose lactulose  - Abd [place] ordered initially which showed small to moderate volume of scattered abd. ascities  UTI: Positive nitrites  - U/C pending but has > 100,000 cfu of E coli.  - WBC up today - Expand antibiotic regimen to Vanc and Zosyn at this juncture given that patient meets sirs criteria with source of infection. Ladona Ridgel antibiotic regimen  once WBC trend down.  DM - Continue current regimen -stable currently.  HTN/HLD. Blood pressure stable to elevated  - Continue statin  - Patient' BP elevated today will start patient on metoprolol tartrate low dose 01/24/12  CKD with mild AKI: May be prerenal most likely.  Will administer normal saline and reassess.   Chronic pain, currently uncontrolled: Continue elavil, oxycodone prn   Dementia, exacerbated by acute infection, possible TIA: Continue memantine and donepezil   Depression/anxiety:  -Stable, continue zoloft   Hypothyroidism:  -Stable, continue synthroid.  Will recheck TSH   Code Status: DNR Family Communication: Husband at bedside  Disposition Plan: Pending clinical improvement. Likely home in 1-2 days.  Will change antibiotics to oral regimen once sensitivities come back   Consultants:  none  Procedures:  MRI, US abdomen, chest x ray, CT head  Antibiotics:  Continue current antibiotic regimen.  HPI/Subjective: No new complaints today.  No acute issues reported overnight.  Objective: Filed Vitals:   01/24/12 0200 01/24/12 0600 01/24/12 0733 01/24/12 1315  BP: 151/55 138/55 146/62 151/69  Pulse: 102 104 107 108  Temp: 98.1 F (36.7 C) 98.2 F (36.8 C) 99.3 F (37.4 C) 99.8 F (37.7 C)  TempSrc:   Oral Oral  Resp: 26 24 22    Height:      Weight:      SpO2: 95% 93% 94% 97%    Intake/Output Summary (Last 24 hours) at 01/24/12 1514 Last data filed at 01/24/12 1103  Gross per 24 hour  Intake    600 ml  Output  925 ml  Net   -325 ml   Filed Weights   01/22/12 1432  Weight: 92.987 kg (205 lb)    Exam:   General:  Pt in NAD, Alert and Awake  Cardiovascular: RRR, No MRG  Respiratory: Breath sounds BL, no wheezes  Abdomen: soft, NT  Data Reviewed: Basic Metabolic Panel:  Lab 01/24/12 1610 01/23/12 0600 01/22/12 1446  NA 143 144 142  K 3.1* 3.7 4.6  CL 103 106 105  CO2 32 29 27  GLUCOSE 100* 129* 232*  BUN 20 25* 26*    CREATININE 1.14* 1.33* 1.41*  CALCIUM 8.3* 8.6 8.5  MG -- -- --  PHOS -- -- --   Liver Function Tests:  Lab 01/22/12 1646  AST 38*  ALT 16  ALKPHOS 102  BILITOT 0.6  PROT 6.9  ALBUMIN 2.8*   No results found for this basename: LIPASE:5,AMYLASE:5 in the last 168 hours  Lab 01/22/12 1645  AMMONIA 53   CBC:  Lab 01/24/12 0455 01/23/12 1944 01/23/12 0600 01/22/12 1646 01/22/12 1446  WBC 11.8* -- 10.0 10.6* 11.1*  NEUTROABS -- -- -- -- 9.0*  HGB 8.6* 8.5* 7.5* 6.2* 6.6*  HCT 27.7* 27.3* 24.6* 21.2* 22.6*  MCV 85.5 -- 87.2 86.9 86.6  PLT 110* -- 138* 152 170   Cardiac Enzymes:  Lab 01/23/12 0600 01/23/12 0025 01/22/12 1931 01/22/12 1447  CKTOTAL -- -- -- --  CKMB -- -- -- --  CKMBINDEX -- -- -- --  TROPONINI <0.30 <0.30 <0.30 <0.30   BNP (last 3 results)  Basename 12/05/11 1354 11/07/11 1531 07/02/11 1517  PROBNP 132.0* 184.7 52.0   CBG:  Lab 01/24/12 1124 01/24/12 0657 01/23/12 2250 01/23/12 2217 01/23/12 1620  GLUCAP 112* 70 85 56* 83    Recent Results (from the past 240 hour(s))  URINE CULTURE     Status: Normal   Collection Time   01/22/12  4:32 PM      Component Value Range Status Comment   Specimen Description URINE, CATHETERIZED   Final    Special Requests NONE   Final    Culture  Setup Time 01/22/2012 19:13   Final    Colony Count >=100,000 COLONIES/ML   Final    Culture ESCHERICHIA COLI   Final    Report Status 01/24/2012 FINAL   Final    Organism ID, Bacteria ESCHERICHIA COLI   Final      Studies: Dg Chest 1 View  01/24/2012  *RADIOLOGY REPORT*  Clinical Data: Shortness of breath, fever, leukocytosis  CHEST - 1 VIEW  Comparison: 01/22/2012  Findings: Cardiomegaly again noted.  There is poor inspiration. Persistent elevation of the right hemidiaphragm.  Persistent small right pleural effusion with right basilar atelectasis or infiltrate.  Stable left basilar atelectasis.  Central vascular congestion without convincing pulmonary edema.   IMPRESSION:  Persistent elevation of the right hemidiaphragm.  Persistent small right pleural effusion with right basilar atelectasis or infiltrate.  Stable left basilar atelectasis.  Central vascular congestion without convincing pulmonary edema.   Original Report Authenticated By: Natasha Mead, M.D.    Dg Chest 1 View  01/22/2012  *RADIOLOGY REPORT*  Clinical Data: Altered mental status.  Shortness of breath.  CHEST - 1 VIEW  Comparison: Chest x-ray 11/07/2011.  Findings: Lung volumes are very low, and there is persistent elevation of the right hemidiaphragm (noted on prior).  Even allowing for these low lung volumes, there is blunting of the right costophrenic sulcus, suggesting a small right pleural effusion.  Crowding of the pulmonary vasculature is noted, without frank pulmonary edema.  Bibasilar opacities are favored to represent subsegmental atelectasis, however, underlying airspace consolidation is difficult to exclude.  Mild cardiomegaly is unchanged. The patient is rotated to the right on today's exam, resulting in distortion of the mediastinal contours and reduced diagnostic sensitivity and specificity for mediastinal pathology. Atherosclerosis in the thoracic aorta.  Calcified mediastinal and bilateral hilar lymph nodes suggesting old granulomatous disease.  IMPRESSION: 1.  Low lung volumes with probable bibasilar subsegmental atelectasis and a small right pleural effusion. 2.  Cardiomegaly with pulmonary venous congestion, but no frank pulmonary edema at this time. 3.  Atherosclerosis. 4.  Sequelae of old granulomatous disease.   Original Report Authenticated By: Florencia Reasons, M.D.    Ct Head Wo Contrast  01/22/2012  *RADIOLOGY REPORT*  Clinical Data: Facial droop.  CT HEAD WITHOUT CONTRAST  Technique:  Contiguous axial images were obtained from the base of the skull through the vertex without contrast.  Comparison: July 14, 2011.  Findings: Bony calvarium is intact.  No mass effect or  midline shift is noted.  Ventricular size is within normal limits.  There is no evidence of mass lesion, hemorrhage or acute infarction.  IMPRESSION: No acute intracranial abnormality is noted.   Original Report Authenticated By: Venita Sheffield., M.D.    Mr Brain Wo Contrast  01/23/2012  *RADIOLOGY REPORT*  Clinical Data: Altered mental status.  Rule out stroke.  MRI HEAD WITHOUT CONTRAST  Technique:  Multiplanar, multiecho pulse sequences of the brain and surrounding structures were obtained according to standard protocol without intravenous contrast.  Comparison: CT 01/22/2012  Findings: Image quality degraded by a moderate amount of motion. The patient was not able to perform MRI at this time due to inability to hold still.  Generalized atrophy.  Negative for acute infarct. Mild chronic microvascular ischemia in the cerebral white matter.  Small chronic infarct in the right thalamus.  Negative for hemorrhage or mass lesion.  No shift of the midline structures.  Paranasal sinuses are clear.  IMPRESSION: Image quality degraded by motion.  No acute abnormality is identified.   Original Report Authenticated By: Camelia Phenes, M.D.    US Abdomen Complete  01/22/2012  *RADIOLOGY REPORT*  Clinical Data:  Cirrhosis.  Abdominal distention.  COMPLETE ABDOMINAL ULTRASOUND  Comparison:  Ultrasound 07/15/2011.  Findings:  Gallbladder:  There are some small stones and sludge within the gallbladder.  No wall thickening is identified.  Common bile duct:  Measures 0.5 cm.  Liver:  The liver is shrunken with a nodular contour consistent with cirrhosis.  Small to moderate volume of ascites is present scattered through the abdomen. Sonographer reports right upper quadrant tenderness.  IVC:  Appears normal.  Pancreas:  No focal abnormality seen.  Spleen:  Measures 7.2 cm and appears normal.  Right Kidney:  Measures 9.3 cm and appears normal.  Left Kidney:  Measures 9.8 cm and appears normal.  Abdominal aorta:  No aneurysm  identified.  IMPRESSION:  1.  Cirrhosis with a small to moderate volume of scattered abdominal ascites. 2.  Small gallstones without gallbladder wall thickening.   Original Report Authenticated By: Bernadene Bell. D'ALESSIO, M.D.     Scheduled Meds:    . amitriptyline  10 mg Oral QHS  . dextrose      . donepezil  10 mg Oral QHS  . furosemide  40 mg Oral TID  . insulin aspart  0-9 Units Subcutaneous TID WC  .  insulin aspart  3 Units Subcutaneous TID WC  . insulin glargine  35 Units Subcutaneous Daily  . lactulose  20 g Oral Daily  . latanoprost  1 drop Both Eyes QHS  . levothyroxine  175 mcg Oral QAC breakfast  . memantine  10 mg Oral BID  . piperacillin-tazobactam (ZOSYN)  IV  3.375 g Intravenous Q8H  . sertraline  50 mg Oral BID  . simvastatin  40 mg Oral QPM  . vancomycin  1,250 mg Intravenous Q24H  . DISCONTD: cefTRIAXone (ROCEPHIN)  IV  1 g Intravenous Q24H  . DISCONTD: insulin glargine  50 Units Subcutaneous Daily  . DISCONTD: phenazopyridine  100 mg Oral TID WC  . DISCONTD: rifaximin  200 mg Oral TID   Continuous Infusions:   Principal Problem:  *TIA (transient ischemic attack) Active Problems:  HYPOTHYROIDISM  HYPERLIPIDEMIA  DEMENTIA  HYPERTENSION  Bell's palsy  Hepatic encephalopathy  Diabetes mellitus  Cirrhosis  Pulmonary edema  Anasarca  Lung disease, restrictive  Anemia, blood loss    Time spent: > 35 minutes    Penny Pia  [facility] Pager [phone]. If 8PM-8AM, please contact night-coverage at [email or web address], password [facility] 01/24/2012, 3:14 PM  LOS: 2 days

## 2012-01-24 NOTE — Clinical Social Work Placement (Addendum)
    Clinical Social Work Department CLINICAL SOCIAL WORK PLACEMENT NOTE 01/24/2012  Patient:  Shannon Charles, Shannon Charles  Account Number:  [id number] Admit date:  01/22/2012  Clinical Social Worker:  Peggyann Shoals  Date/time:  01/24/2012 04:19 PM  Clinical Social Work is seeking post-discharge placement for this patient at the following level of care:   SKILLED NURSING   (*CSW will update this form in Epic as items are completed)   01/24/2012  Patient/family provided with [facility] Department of Clinical Social Work's list of facilities offering this level of care within the geographic area requested by the patient (or if unable, by the patient's family).  01/24/2012  Patient/family informed of their freedom to choose among providers that offer the needed level of care, that participate in Medicare, Medicaid or managed care program needed by the patient, have an available bed and are willing to accept the patient.  01/24/2012  Patient/family informed of [facility]' ownership interest in [facility], as well as of the fact that they are under no obligation to receive care at this facility.  PASARR submitted to EDS on 01/24/2012 PASARR number received from EDS on 01/24/2012  FL2 transmitted to all facilities in geographic area requested by pt/family on  01/24/2012 FL2 transmitted to all facilities within larger geographic area on   Patient informed that his/her managed care company has contracts with or will negotiate with  certain facilities, including the following:     Patient/family informed of bed offers received:  01/25/2012 (DTC) Patient chooses bed at [facility]) Physician recommends and patient chooses bed at    Patient to be transferred to [facility]  On 01/31/2012   Patient to be transferred to facility by  Ambulance ([facility])  (DTC)  The following physician request were entered in Epic:   Additional Comments:  01/25/12  Patient and husband  are pleased with d/c plan to [facility]. Ok per Dr. Cena Benton for planned d/c to SNF on Monday. Notified facility and Cedric- CM- [employer].  Lorri Frederick. [place]  [phone]     Dede Query, MSW, Theresia Majors [phone]

## 2012-01-25 DIAGNOSIS — E109 Type 1 diabetes mellitus without complications: Secondary | ICD-10-CM

## 2012-01-25 LAB — CBC
Hemoglobin: 8.8 g/dL — ABNORMAL LOW (ref 12.0–15.0)
MCHC: 30.2 g/dL (ref 30.0–36.0)
RDW: 19 % — ABNORMAL HIGH (ref 11.5–15.5)
WBC: 11.2 10*3/uL — ABNORMAL HIGH (ref 4.0–10.5)

## 2012-01-25 LAB — BASIC METABOLIC PANEL
GFR calc Af Amer: 46 mL/min — ABNORMAL LOW (ref >90)
GFR calc non Af Amer: 40 mL/min — ABNORMAL LOW (ref >90)
Potassium: 3.3 mEq/L — ABNORMAL LOW (ref 3.5–5.1)
Sodium: 144 mEq/L (ref 135–145)

## 2012-01-25 LAB — OCCULT BLOOD X 1 CARD TO LAB, STOOL: Fecal Occult Bld: POSITIVE

## 2012-01-25 LAB — PROTEIN ELECTROPHORESIS, SERUM
Alpha-1-Globulin: 6.9 % — ABNORMAL HIGH (ref 2.9–4.9)
Alpha-2-Globulin: 7.7 % (ref 7.1–11.8)
Beta 2: 6.7 % — ABNORMAL HIGH (ref 3.2–6.5)
Gamma Globulin: 25 % — ABNORMAL HIGH (ref 11.1–18.8)
M-Spike, %: NOT DETECTED g/dL

## 2012-01-25 LAB — GLUCOSE, CAPILLARY
Glucose-Capillary: 76 mg/dL (ref 70–99)
Glucose-Capillary: 102 mg/dL — ABNORMAL HIGH (ref 70–99)
Glucose-Capillary: 100 mg/dL — ABNORMAL HIGH (ref 70–99)
Glucose-Capillary: 151 mg/dL — ABNORMAL HIGH (ref 70–99)

## 2012-01-25 LAB — TSH: TSH: 0.821 u[IU]/mL (ref 0.350–4.500)

## 2012-01-25 MED ORDER — WHITE PETROLATUM GEL
Status: AC
  Filled 2012-01-25: qty 5

## 2012-01-25 NOTE — Progress Notes (Signed)
Occupational Therapy Treatment Patient Details Name: Shannon Charles MRN: [id number] DOB: [birth year] Today's Date: 01/25/2012 Time: 6213-0865 OT Time Calculation (min): 23 min  OT Assessment / Plan / Recommendation Comments on Treatment Session Pt making slow progress. Mobility limited by pain. Discussed need for assistance for pt care after D/C home from SNF due to dementia.  Emotional support given.    Follow Up Recommendations  Skilled nursing facility    Barriers to Discharge   decreased caregiver support    Equipment Recommendations  None recommended by OT    Recommendations for Other Services  Rec palliative care consult for family education and support  Frequency Min 2X/week   Plan Discharge plan remains appropriate    Precautions / Restrictions Precautions Precautions: Fall   Pertinent Vitals/Pain Apparent pain with mobility. Did not rate. No c/o pain in supine    ADL  Grooming: Set up (vc to maintain attention) Where Assessed - Grooming: Supine, head of bed up Equipment Used: Gait belt Transfers/Ambulation Related to ADLs: +2 Total A ADL Comments: limited by back pain    OT Diagnosis:    OT Problem List:   OT Treatment Interventions:     OT Goals Acute Rehab OT Goals OT Goal Formulation: Patient unable to participate in goal setting Time For Goal Achievement: 2012-03-01 Potential to Achieve Goals: Good ADL Goals Pt Will Perform Grooming: with supervision;Sitting at sink;Standing at sink Pt Will Perform Upper Body Bathing: with supervision;Sitting, chair;Sitting, edge of bed;Unsupported ADL Goal: Upper Body Bathing - Progress: Progressing toward goals Pt Will Perform Lower Body Bathing: with supervision;Sit to stand from chair;Sit to stand from bed;Unsupported ADL Goal: Lower Body Bathing - Progress: Progressing toward goals Pt Will Perform Upper Body Dressing: with supervision;Sit to stand from chair;Sit to stand from bed;Unsupported ADL Goal: Upper Body  Dressing - Progress: Progressing toward goals Pt Will Perform Lower Body Dressing: with supervision;Sit to stand from chair;Sit to stand from bed;Unsupported ADL Goal: Lower Body Dressing - Progress: Progressing toward goals Pt Will Transfer to Toilet: with supervision;Ambulation;with DME;Comfort height toilet ADL Goal: Toilet Transfer - Progress: Progressing toward goals Pt Will Perform Toileting - Clothing Manipulation: with supervision;Sitting on 3-in-1 or toilet;Standing ADL Goal: Toileting - Clothing Manipulation - Progress: Progressing toward goals Pt Will Perform Toileting - Hygiene: with supervision;Sit to stand from 3-in-1/toilet;Standing at 3-in-1/toilet ADL Goal: Toileting - Hygiene - Progress: Progressing toward goals Miscellaneous OT Goals Miscellaneous OT Goal #1: Pt will perform bed mobility with supervision in prep for EOB ADLs. OT Goal: Miscellaneous Goal #1 - Progress: Progressing toward goals  Visit Information  Last OT Received On: 01/25/12 Assistance Needed: +2    Subjective Data      Prior Functioning       Cognition  Overall Cognitive Status: History of cognitive impairments - at baseline Arousal/Alertness: Awake/alert Orientation Level: Disoriented to;Place;Situation Behavior During Session: Restless    Mobility  Shoulder Instructions Bed Mobility Bed Mobility: Supine to Sit;Sitting - Scoot to Edge of Bed Supine to Sit: 1: +2 Total assist;HOB elevated;With rails Supine to Sit: Patient Percentage: 30% Sitting - Scoot to Edge of Bed: 1: +2 Total assist Sitting - Scoot to Edge of Bed: Patient Percentage: 30% Sit to Sidelying Right: 1: +2 Total assist Sit to Sidelying Right: Patient Percentage: 20% Scooting to HOB: 1: +2 Total assist Scooting to [facility]: Patient Percentage: 10% Details for Bed Mobility Assistance: perseverating on pain Transfers Transfers: Sit to Stand;Stand to Sit Sit to Stand: 1: +2 Total assist;From bed;With upper extremity  assist Sit  to Stand: Patient Percentage: 40% Details for Transfer Assistance: B HHA.Able to stand @ 3 min x 2       Exercises      Balance Static Sitting Balance Static Sitting - Balance Support:  (pushing posteriorly due to apparent pain)   End of Session OT - End of Session Equipment Utilized During Treatment: Gait belt Activity Tolerance: Patient limited by pain Patient left: in bed;with family/visitor present;with call bell/phone within reach Nurse Communication: Mobility status  GO     Yeni Jiggetts,HILLARY 01/25/2012, 11:35 AM Luisa Dago, OTR/L  [phone] 01/25/2012

## 2012-01-25 NOTE — Progress Notes (Signed)
[facility] PROGRESS NOTE  Shannon Charles RUE:[id number] DOB: [birth year] DOA: 01/22/2012 PCP: Romero Belling, MD  Assessment/Plan: TIA versus CVA but TIA most likely given negative workup at this juncture: Patient has risk factors of HTN, HLD, DM.  - Continue to monitor on telemetry.  No red flags reported. - CT/MRI: Without acute abnormality - Carotid duplex shows no evidence of significant internal carotid artery stenosis - ECHO shows EF of 65-70% with grade 1 diastolic dysfunction - A1c is 6.4 - Lipid panel reviewed. - Hold ASA given GIB  - PT evaluation reviewed. No equipment recommendations. Recommended SNF on d/c  Symptomatic anemia with tachycardia and dypsnea. Likely due to GI blood loss with upper more likely given melena. DDx includes gastritis, PUD, AVM, portal hypertensive gastropathy  - Tx 2 units PRBC  Hemoglobin after transfusion 8.6. - Will need GI follow up as outpatient. - UTI may be contributing.  Will continue Ceftriaxone - hemoglobin trending up.  Tachycardia and tachypnea: Most likely due to pain, GIB and symptomatic anemia.  No evidence of wheeze or rales on exam and CXR negative for pneumonia. Restrictive lung disease likely contributing.  - Telemetry  - CE x 3 negative - Wean oxygen as tolerated  - Will obtain repeat EKG 01/24/12 - Place on IVF's and reassess next am. - TSH WNL's at this time. - improved with B blocker on board.  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 Ammonia level improved from prior, but at risk for SBP which may have contributed to weakness and fall last night.  - Continue rifaximin  - Continue lasix  - Restart lower dose lactulose  - Abd [place] ordered initially which showed small to moderate volume of scattered abd. ascities  UTI: Positive nitrites  - U/C pending but has > 100,000 cfu of E coli.  - WBC up today - Expand antibiotic regimen to Vanc and Zosyn at this juncture given that patient meets sirs criteria with source  of infection. Ladona Ridgel antibiotic regimen once WBC trend down.  DM - Continue current regimen -stable currently.  HTN/HLD. Blood pressure stable to elevated  - Continue statin  - Patient' BP elevated today will start patient on metoprolol tartrate low dose 01/24/12  CKD with mild AKI: May be prerenal most likely.  Will administer normal saline and reassess.   Chronic pain, currently uncontrolled: Continue elavil, oxycodone prn   Dementia, exacerbated by acute infection, possible TIA: Continue memantine and donepezil   Depression/anxiety:  -Stable, continue zoloft   Hypothyroidism:  -Stable, continue synthroid.     Code Status: DNR Family Communication: Husband at bedside  Disposition Plan: Pending clinical improvement. Likely home in 1-2 days.  Will change antibiotics once patient's WBC gets closer to normal limits.  Consultants:  none  Procedures:  MRI, US abdomen, chest x ray, CT head  Antibiotics:  Continue current antibiotic regimen.  HPI/Subjective: No new complaints today.  No acute issues reported overnight. Pt reports less shortness of breath today.  Was complaining of some abdominal discomfort.  Objective: Filed Vitals:   01/24/12 2104 01/25/12 0206 01/25/12 0500 01/25/12 1032  BP: 123/63 127/56 115/58 142/63  Pulse: 87 82 81 96  Temp: 98.4 F (36.9 C) 98.2 F (36.8 C) 97.4 F (36.3 C) 97.9 F (36.6 C)  TempSrc: Oral Oral Oral Oral  Resp:  18 18 18   Height:      Weight:      SpO2: 97% 90% 100% 99%    Intake/Output Summary (Last 24 hours) at  01/25/12 1212 Last data filed at 01/24/12 2000  Gross per 24 hour  Intake    120 ml  Output      0 ml  Net    120 ml   Filed Weights   01/22/12 1432  Weight: 92.987 kg (205 lb)    Exam:   General:  Pt in NAD, Alert and Awake  Cardiovascular: RRR, No MRG  Respiratory: Breath sounds BL, no wheezes  Abdomen: soft, NT  Data Reviewed: Basic Metabolic Panel:  Lab 01/25/12 1610 01/24/12 0455  01/23/12 0600 01/22/12 1446  NA 144 143 144 142  K 3.3* 3.1* 3.7 4.6  CL 103 103 106 105  CO2 35* 32 29 27  GLUCOSE 75 100* 129* 232*  BUN 18 20 25* 26*  CREATININE 1.22* 1.14* 1.33* 1.41*  CALCIUM 8.5 8.3* 8.6 8.5  MG -- -- -- --  PHOS -- -- -- --   Liver Function Tests:  Lab 01/22/12 1646  AST 38*  ALT 16  ALKPHOS 102  BILITOT 0.6  PROT 6.9  ALBUMIN 2.8*   No results found for this basename: LIPASE:5,AMYLASE:5 in the last 168 hours  Lab 01/22/12 1645  AMMONIA 53   CBC:  Lab 01/25/12 0630 01/24/12 0455 01/23/12 1944 01/23/12 0600 01/22/12 1646 01/22/12 1446  WBC 11.2* 11.8* -- 10.0 10.6* 11.1*  NEUTROABS -- -- -- -- -- 9.0*  HGB 8.8* 8.6* 8.5* 7.5* 6.2* --  HCT 29.1* 27.7* 27.3* 24.6* 21.2* --  MCV 87.7 85.5 -- 87.2 86.9 86.6  PLT 113* 110* -- 138* 152 170   Cardiac Enzymes:  Lab 01/23/12 0600 01/23/12 0025 01/22/12 1931 01/22/12 1447  CKTOTAL -- -- -- --  CKMB -- -- -- --  CKMBINDEX -- -- -- --  TROPONINI <0.30 <0.30 <0.30 <0.30   BNP (last 3 results)  Basename 12/05/11 1354 11/07/11 1531 07/02/11 1517  PROBNP 132.0* 184.7 52.0   CBG:  Lab 01/25/12 1136 01/25/12 0647 01/24/12 2107 01/24/12 1647 01/24/12 1124  GLUCAP 102* 76 98 89 112*    Recent Results (from the past 240 hour(s))  URINE CULTURE     Status: Normal   Collection Time   01/22/12  4:32 PM      Component Value Range Status Comment   Specimen Description URINE, CATHETERIZED   Final    Special Requests NONE   Final    Culture  Setup Time 01/22/2012 19:13   Final    Colony Count >=100,000 COLONIES/ML   Final    Culture ESCHERICHIA COLI   Final    Report Status 01/24/2012 FINAL   Final    Organism ID, Bacteria ESCHERICHIA COLI   Final      Studies: Dg Chest 1 View  01/24/2012  *RADIOLOGY REPORT*  Clinical Data: Shortness of breath, fever, leukocytosis  CHEST - 1 VIEW  Comparison: 01/22/2012  Findings: Cardiomegaly again noted.  There is poor inspiration. Persistent elevation of the  right hemidiaphragm.  Persistent small right pleural effusion with right basilar atelectasis or infiltrate.  Stable left basilar atelectasis.  Central vascular congestion without convincing pulmonary edema.  IMPRESSION:  Persistent elevation of the right hemidiaphragm.  Persistent small right pleural effusion with right basilar atelectasis or infiltrate.  Stable left basilar atelectasis.  Central vascular congestion without convincing pulmonary edema.   Original Report Authenticated By: Natasha Mead, M.D.     Scheduled Meds:    . amitriptyline  10 mg Oral QHS  . donepezil  10 mg Oral QHS  .  furosemide  40 mg Oral TID  . insulin aspart  0-9 Units Subcutaneous TID WC  . insulin aspart  3 Units Subcutaneous TID WC  . insulin glargine  35 Units Subcutaneous Daily  . lactulose  20 g Oral Daily  . latanoprost  1 drop Both Eyes QHS  . levothyroxine  175 mcg Oral QAC breakfast  . memantine  10 mg Oral BID  . metoprolol tartrate  12.5 mg Oral BID  . piperacillin-tazobactam (ZOSYN)  IV  3.375 g Intravenous Q8H  . sertraline  50 mg Oral BID  . simvastatin  40 mg Oral QPM  . vancomycin  1,250 mg Intravenous Q24H  . white petrolatum      . DISCONTD: insulin glargine  50 Units Subcutaneous Daily   Continuous Infusions:    . sodium chloride 75 mL/hr at 01/24/12 1605    Principal Problem:  *TIA (transient ischemic attack) Active Problems:  HYPOTHYROIDISM  HYPERLIPIDEMIA  DEMENTIA  HYPERTENSION  Bell's palsy  Hepatic encephalopathy  Diabetes mellitus  Cirrhosis  Pulmonary edema  Anasarca  Lung disease, restrictive  Anemia, blood loss    Time spent: > 35 minutes    Penny Pia  [facility] Pager [phone]. If 8PM-8AM, please contact night-coverage at [email or web address], password [facility] 01/25/2012, 12:12 PM  LOS: 3 days

## 2012-01-25 NOTE — Progress Notes (Signed)
Physical Therapy Treatment Patient Details Name: Shannon Charles MRN: [id number] DOB: [birth year] Today's Date: 01/25/2012 Time: 6213-0865 PT Time Calculation (min): 23 min  PT Assessment / Plan / Recommendation Comments on Treatment Session  Slowly progressing.  Cont's to be limited by back pain.  Pt able to stand x 2 with UE support via HHA.      Follow Up Recommendations  Post acute inpatient     Does the patient have the potential to tolerate intense rehabilitation  No, Recommend SNF  Barriers to Discharge        Equipment Recommendations  None recommended by OT    Recommendations for Other Services    Frequency Min 3X/week   Plan Discharge plan remains appropriate;Frequency remains appropriate    Precautions / Restrictions Precautions Precautions: Fall   Pertinent Vitals/Pain Apparent pain with all mobility.  Pt never rated pain level.      Mobility  Bed Mobility Bed Mobility: Supine to Sit;Sitting - Scoot to Edge of Bed Supine to Sit: 1: +2 Total assist;HOB elevated;With rails Supine to Sit: Patient Percentage: 30% Sitting - Scoot to Edge of Bed: 1: +2 Total assist Sitting - Scoot to Edge of Bed: Patient Percentage: 30% Sit to Sidelying Right: 1: +2 Total assist Sit to Sidelying Right: Patient Percentage: 20% Scooting to HOB: 1: +2 Total assist Scooting to [facility]: Patient Percentage: 10% Details for Bed Mobility Assistance: perseverating on pain Transfers Transfers: Sit to Stand;Stand to Sit Sit to Stand: 1: +2 Total assist;From bed Sit to Stand: Patient Percentage: 40% Stand to Sit: 1: +2 Total assist;To bed Stand to Sit: Patient Percentage: 50% Details for Transfer Assistance: UE support provided via bil HHA.  Pt able to stand x 2 from bed with perseveration on pain.  Pt unable to off-load weight from LE's to allow for sidestepping towards HOB.   Ambulation/Gait Ambulation/Gait Assistance: Not tested (comment)      PT Goals Acute Rehab PT Goals Time For Goal  Achievement: 02/06/12 Potential to Achieve Goals: Good Pt will Roll Supine to Left Side: with supervision Pt will go Supine/Side to Sit: with supervision;with HOB 0 degrees PT Goal: Supine/Side to Sit - Progress: Not progressing Pt will Sit at [facility] of Bed: with supervision;3-5 min;with unilateral upper extremity support PT Goal: Sit at Edge Of Bed - Progress: Not progressing Pt will go Sit to Supine/Side: with supervision;with HOB 0 degrees PT Goal: Sit to Supine/Side - Progress: Not progressing Pt will go Sit to Stand: with min assist;with upper extremity assist PT Goal: Sit to Stand - Progress: Progressing toward goal Pt will go Stand to Sit: with min assist;with upper extremity assist PT Goal: Stand to Sit - Progress: Progressing toward goal Pt will Ambulate: 16 - 50 feet;with min assist;with least restrictive assistive device Pt will Perform Home Exercise Program: with supervision, verbal cues required/provided  Visit Information  Last PT Received On: 01/25/12 Assistance Needed: +2    Subjective Data      Cognition  Overall Cognitive Status: History of cognitive impairments - at baseline Arousal/Alertness: Awake/alert Orientation Level: Disoriented to;Place;Situation Behavior During Session: Restless    Balance  Static Sitting Balance Static Sitting - Balance Support:  (pushing posteriorly due to apparent pain) Static Sitting - Comment/# of Minutes: pt pushing posteriorly due to apparent pain.  Cont's with impulsive movement & inability to sit comfortably.   Static Standing Balance Static Standing - Balance Support: Bilateral upper extremity supported Static Standing - Level of Assistance: 1: +2 Total assist Static  Standing - Comment/# of Minutes: standing limited due to pain.    End of Session PT - End of Session Equipment Utilized During Treatment: Gait belt Activity Tolerance: Patient limited by pain Patient left: in bed;with call bell/phone within reach;with  family/visitor present Nurse Communication: Mobility status     Verdell Face, [place] [phone] 01/25/2012

## 2012-01-26 ENCOUNTER — Inpatient Hospital Stay (HOSPITAL_COMMUNITY): Payer: Medicare Other

## 2012-01-26 DIAGNOSIS — K7290 Hepatic failure, unspecified without coma: Secondary | ICD-10-CM

## 2012-01-26 LAB — GLUCOSE, CAPILLARY
Glucose-Capillary: 86 mg/dL (ref 70–99)
Glucose-Capillary: 108 mg/dL — ABNORMAL HIGH (ref 70–99)

## 2012-01-26 LAB — BASIC METABOLIC PANEL
BUN: 19 mg/dL (ref 6–23)
Creatinine, Ser: 1.2 mg/dL — ABNORMAL HIGH (ref 0.50–1.10)
GFR calc Af Amer: 47 mL/min — ABNORMAL LOW (ref >90)
GFR calc non Af Amer: 41 mL/min — ABNORMAL LOW (ref >90)
Glucose, Bld: 89 mg/dL (ref 70–99)

## 2012-01-26 LAB — CBC
HCT: 30.2 % — ABNORMAL LOW (ref 36.0–46.0)
Hemoglobin: 8.9 g/dL — ABNORMAL LOW (ref 12.0–15.0)
MCHC: 29.5 g/dL — ABNORMAL LOW (ref 30.0–36.0)
MCV: 89.1 fL (ref 78.0–100.0)
RDW: 19.7 % — ABNORMAL HIGH (ref 11.5–15.5)

## 2012-01-26 MED ORDER — RIFAXIMIN 200 MG PO TABS
200.0000 mg | ORAL_TABLET | Freq: Three times a day (TID) | ORAL | Status: DC
  Administered 2012-01-26 – 2012-02-01 (×17): 200 mg via ORAL
  Filled 2012-01-26 (×22): qty 1

## 2012-01-26 MED ORDER — KETOROLAC TROMETHAMINE 15 MG/ML IJ SOLN
15.0000 mg | Freq: Once | INTRAMUSCULAR | Status: AC
  Administered 2012-01-26: 15 mg via INTRAVENOUS
  Filled 2012-01-26 (×2): qty 1

## 2012-01-26 MED ORDER — DEXTROSE 50 % IV SOLN: 1.0000 | Freq: Once | INTRAVENOUS | Status: DC

## 2012-01-26 MED ORDER — LEVOFLOXACIN 250 MG PO TABS
250.0000 mg | ORAL_TABLET | Freq: Every day | ORAL | Status: DC
  Administered 2012-01-26 – 2012-02-01 (×7): 250 mg via ORAL
  Filled 2012-01-26 (×7): qty 1

## 2012-01-26 MED ORDER — DEXTROSE 50 % IV SOLN
INTRAVENOUS | Status: AC
  Administered 2012-01-26: 50 mL
  Filled 2012-01-26: qty 50

## 2012-01-26 MED ORDER — OXYCODONE HCL 5 MG PO TABS
5.0000 mg | ORAL_TABLET | Freq: Once | ORAL | Status: AC
  Administered 2012-01-26: 5 mg via ORAL

## 2012-01-26 NOTE — Progress Notes (Signed)
Hypoglycemic Event  CBG: 34 1638  Treatment: 8 oz orange juice. 4 peanut butter gram crackers and dinner  Symptoms: pt states she feels fine- some slight tremors  Follow-up CBG: Time: 1651 CBG Result:45  Possible Reasons for Event: lack of po caloric intake between lunch (pt received meal coverage and ate lunch) and dinner   Comments/MD notified: MD ordered 1 ampule of D5 given at 1705 and stopped her meals coverage.  Follow Up  CBG: Time: 1720  CBG Result: 116

## 2012-01-26 NOTE — Progress Notes (Signed)
ANTIBIOTIC CONSULT NOTE - INITIAL  Pharmacy Consult for Levaquin (po) Indication: Ecoli UTI  No Known Allergies  Patient Measurements: Height: 5\' 2"  (157.5 cm) Weight: 205 lb (92.987 kg) IBW/kg (Calculated) : 50.1   Vital Signs: Temp: 98.5 F (36.9 C) (11/02 0658) Temp src: Oral (11/02 0658) BP: 137/59 mmHg (11/02 0658) Pulse Rate: 92  (11/02 0658) Intake/Output from previous day:   Intake/Output from this shift:    Labs:  Basename 01/26/12 0550 01/25/12 0630 01/24/12 0455  WBC 9.5 11.2* 11.8*  HGB 8.9* 8.8* 8.6*  PLT 113* 113* 110*  LABCREA -- -- --  CREATININE 1.20* 1.22* 1.14*   Estimated Creatinine Clearance: 38.4 ml/min (by C-G formula based on Cr of 1.2). No results found for this basename: VANCOTROUGH:2,VANCOPEAK:2,VANCORANDOM:2,GENTTROUGH:2,GENTPEAK:2,GENTRANDOM:2,TOBRATROUGH:2,TOBRAPEAK:2,TOBRARND:2,AMIKACINPEAK:2,AMIKACINTROU:2,AMIKACIN:2, in the last 72 hours   Microbiology: Recent Results (from the past 720 hour(s))  URINE CULTURE     Status: Normal   Collection Time   01/22/12  4:32 PM      Component Value Range Status Comment   Specimen Description URINE, CATHETERIZED   Final    Special Requests NONE   Final    Culture  Setup Time 01/22/2012 19:13   Final    Colony Count >=100,000 COLONIES/ML   Final    Culture ESCHERICHIA COLI   Final    Report Status 01/24/2012 FINAL   Final    Organism ID, Bacteria ESCHERICHIA COLI   Final     Assessment: 78 y.o. female known to pharmacy from antibiotic dosing. Now to narrow antibiotics to po levaquin for Ecoli UTI.  Goal of Therapy:  Eradication of infection  Plan:  1. Levaquin 250mg  po daily 2. F/u renal function  Christoper Fabian, PharmD, BCPS Clinical pharmacist, pager [phone] 01/26/2012,7:24 AM

## 2012-01-26 NOTE — Progress Notes (Signed)
[facility] PROGRESS NOTE  Shannon Charles:[id number] DOB: 1927-07-18 DOA: 01/22/2012 PCP: Romero Belling, MD  Assessment/Plan: TIA versus CVA but TIA most likely given negative workup at this juncture: Patient has risk factors of HTN, HLD, DM.  - Continue to monitor on telemetry.  No red flags reported. - CT/MRI: Without acute abnormality - Carotid duplex shows no evidence of significant internal carotid artery stenosis - ECHO shows EF of 65-70% with grade 1 diastolic dysfunction - A1c is 6.4 - Lipid panel reviewed. - Hold ASA given GIB  - PT evaluation reviewed. No equipment recommendations. Recommended SNF on d/c  Symptomatic anemia with tachycardia and dypsnea. Likely due to GI blood loss with upper more likely given melena. DDx includes gastritis, PUD, AVM, portal hypertensive gastropathy  - Tx 2 units PRBC  Hemoglobin after transfusion 8.6 and has been steady at this juncture - Will need GI follow up as outpatient. - UTI may be contributing.  Will continue Ceftriaxone - hemoglobin trending up.  Tachycardia and tachypnea: Most likely due to pain, GIB and symptomatic anemia.  No evidence of wheeze or rales on exam and CXR negative for pneumonia. Restrictive lung disease likely contributing.  - Telemetry  - CE x 3 negative - Wean oxygen as tolerated  - Will obtain repeat EKG 01/24/12 - Place on IVF's and reassess next am. - TSH WNL's at this time. - improved with B blocker on board.  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 Ammonia level improved from prior, but at risk for SBP which may have contributed to weakness and fall last night.  - Continue rifaximin  - Continue lasix  - Restart lower dose lactulose  - Abd [place] ordered initially which showed small to moderate volume of scattered abd. ascities  UTI: Positive nitrites  - U/C pending but has > 100,000 cfu of E coli.  - WBC up today - Expand antibiotic regimen to Vanc and Zosyn at this juncture given that  patient meets sirs criteria with source of infection. - Given sensitivities and improved status will change antibiotic regimen to levaquin today  DM - Continue current regimen -stable currently.  HTN/HLD. Blood pressure stable to elevated  - Continue statin  - Patient' BP elevated today will start patient on metoprolol tartrate low dose 01/24/12  CKD with mild AKI:  - Improved with IVF's and seems to be at base line at this juncture.  Will administer IVF's and reevaluate.  Chronic pain, currently uncontrolled:  -Continue elavil, oxycodone prn   Dementia, exacerbated by acute infection, possible TIA:  -Continue memantine and donepezil   Depression/anxiety:  -Stable, continue zoloft   Hypothyroidism:  -Stable, continue synthroid.    Chronic Back pain (lumbar) - Patient has history of osteoarthritis but at this point given history of fall will go ahead and obtain lower back x ray to evaluate further.   Code Status: DNR Family Communication: Husband at bedside and grand daughter Delaney Meigs at [phone] Disposition Plan:   To SNF once WBC improved  Consultants:  none  Procedures:  MRI, US abdomen, chest x ray, CT head  Antibiotics:  Continue current antibiotic regimen.  HPI/Subjective: No new complaints today.  Denies any back pain today. No acute issues reported overnight.  Objective: Filed Vitals:   01/25/12 1408 01/25/12 2132 01/26/12 0107 01/26/12 0658  BP: 128/61 130/60 120/51 137/59  Pulse: 85 96 78 92  Temp: 98 F (36.7 C) 98.7 F (37.1 C) 98.8 F (37.1 C) 98.5 F (36.9 C)  TempSrc: Oral  Oral Oral Oral  Resp: 18 20 18 22   Height:      Weight:      SpO2: 98% 97% 100% 96%   No intake or output data in the 24 hours ending 01/26/12 0953 Filed Weights   01/22/12 1432  Weight: 92.987 kg (205 lb)    Exam:   General:  Pt in NAD, Alert and Awake  Cardiovascular: RRR, No MRG  Respiratory: Breath sounds BL, no wheezes  Abdomen: soft, NT  Data  Reviewed: Basic Metabolic Panel:  Lab 01/26/12 4540 01/25/12 1255 01/25/12 0630 01/24/12 0455 01/23/12 0600 01/22/12 1446  NA 151* -- 144 143 144 142  K 3.5 -- 3.3* 3.1* 3.7 4.6  CL 108 -- 103 103 106 105  CO2 35* -- 35* 32 29 27  GLUCOSE 89 -- 75 100* 129* 232*  BUN 19 -- 18 20 25* 26*  CREATININE 1.20* -- 1.22* 1.14* 1.33* 1.41*  CALCIUM 8.3* -- 8.5 8.3* 8.6 8.5  MG -- 2.1 -- -- -- --  PHOS -- 3.8 -- -- -- --   Liver Function Tests:  Lab 01/22/12 1646  AST 38*  ALT 16  ALKPHOS 102  BILITOT 0.6  PROT 6.9  ALBUMIN 2.8*   No results found for this basename: LIPASE:5,AMYLASE:5 in the last 168 hours  Lab 01/22/12 1645  AMMONIA 53   CBC:  Lab 01/26/12 0550 01/25/12 0630 01/24/12 0455 01/23/12 1944 01/23/12 0600 01/22/12 1646 01/22/12 1446  WBC 9.5 11.2* 11.8* -- 10.0 10.6* --  NEUTROABS -- -- -- -- -- -- 9.0*  HGB 8.9* 8.8* 8.6* 8.5* 7.5* -- --  HCT 30.2* 29.1* 27.7* 27.3* 24.6* -- --  MCV 89.1 87.7 85.5 -- 87.2 86.9 --  PLT 113* 113* 110* -- 138* 152 --   Cardiac Enzymes:  Lab 01/23/12 0600 01/23/12 0025 01/22/12 1931 01/22/12 1447  CKTOTAL -- -- -- --  CKMB -- -- -- --  CKMBINDEX -- -- -- --  TROPONINI <0.30 <0.30 <0.30 <0.30   BNP (last 3 results)  Basename 12/05/11 1354 11/07/11 1531 07/02/11 1517  PROBNP 132.0* 184.7 52.0   CBG:  Lab 01/26/12 0646 01/25/12 2125 01/25/12 1639 01/25/12 1136 01/25/12 0647  GLUCAP 86 151* 100* 102* 76    Recent Results (from the past 240 hour(s))  URINE CULTURE     Status: Normal   Collection Time   01/22/12  4:32 PM      Component Value Range Status Comment   Specimen Description URINE, CATHETERIZED   Final    Special Requests NONE   Final    Culture  Setup Time 01/22/2012 19:13   Final    Colony Count >=100,000 COLONIES/ML   Final    Culture ESCHERICHIA COLI   Final    Report Status 01/24/2012 FINAL   Final    Organism ID, Bacteria ESCHERICHIA COLI   Final      Studies: No results found.  Scheduled Meds:     . amitriptyline  10 mg Oral QHS  . donepezil  10 mg Oral QHS  . furosemide  40 mg Oral TID  . insulin aspart  0-9 Units Subcutaneous TID WC  . insulin aspart  3 Units Subcutaneous TID WC  . insulin glargine  35 Units Subcutaneous Daily  . lactulose  20 g Oral Daily  . latanoprost  1 drop Both Eyes QHS  . levofloxacin  250 mg Oral Daily  . levothyroxine  175 mcg Oral QAC breakfast  . memantine  10  mg Oral BID  . metoprolol tartrate  12.5 mg Oral BID  . sertraline  50 mg Oral BID  . simvastatin  40 mg Oral QPM  . white petrolatum      . DISCONTD: piperacillin-tazobactam (ZOSYN)  IV  3.375 g Intravenous Q8H  . DISCONTD: vancomycin  1,250 mg Intravenous Q24H   Continuous Infusions:    . sodium chloride 75 mL/hr at 01/25/12 1607    Principal Problem:  *TIA (transient ischemic attack) Active Problems:  HYPOTHYROIDISM  HYPERLIPIDEMIA  DEMENTIA  HYPERTENSION  Bell's palsy  Hepatic encephalopathy  Diabetes mellitus  Cirrhosis  Pulmonary edema  Anasarca  Lung disease, restrictive  Anemia, blood loss    Time spent: > 35 minutes    Penny Pia  [facility] Pager [phone]. If 8PM-8AM, please contact night-coverage at [email or web address], password [facility] 01/26/2012, 9:53 AM  LOS: 4 days

## 2012-01-27 ENCOUNTER — Inpatient Hospital Stay (HOSPITAL_COMMUNITY): Payer: Medicare Other

## 2012-01-27 DIAGNOSIS — S22080A Wedge compression fracture of T11-T12 vertebra, initial encounter for closed fracture: Secondary | ICD-10-CM

## 2012-01-27 DIAGNOSIS — S22009A Unspecified fracture of unspecified thoracic vertebra, initial encounter for closed fracture: Secondary | ICD-10-CM

## 2012-01-27 LAB — BASIC METABOLIC PANEL
BUN: 21 mg/dL (ref 6–23)
Calcium: 8.4 mg/dL (ref 8.4–10.5)
Chloride: 103 mEq/L (ref 96–112)
Creatinine, Ser: 1.18 mg/dL — ABNORMAL HIGH (ref 0.50–1.10)
GFR calc Af Amer: 48 mL/min — ABNORMAL LOW (ref >90)

## 2012-01-27 LAB — GLUCOSE, CAPILLARY
Glucose-Capillary: 136 mg/dL — ABNORMAL HIGH (ref 70–99)
Glucose-Capillary: 113 mg/dL — ABNORMAL HIGH (ref 70–99)

## 2012-01-27 LAB — CBC
HCT: 31.3 % — ABNORMAL LOW (ref 36.0–46.0)
MCH: 25.6 pg — ABNORMAL LOW (ref 26.0–34.0)
MCHC: 28.4 g/dL — ABNORMAL LOW (ref 30.0–36.0)
MCV: 90.2 fL (ref 78.0–100.0)
Platelets: 108 10*3/uL — ABNORMAL LOW (ref 150–400)
RDW: 20.1 % — ABNORMAL HIGH (ref 11.5–15.5)

## 2012-01-27 MED ORDER — KETOROLAC TROMETHAMINE 15 MG/ML IJ SOLN: 15.0000 mg | Freq: Four times a day (QID) | INTRAMUSCULAR | Status: DC | PRN

## 2012-01-27 MED ORDER — KETOROLAC TROMETHAMINE 15 MG/ML IJ SOLN
15.0000 mg | Freq: Four times a day (QID) | INTRAMUSCULAR | Status: AC | PRN
  Administered 2012-01-27 – 2012-01-31 (×10): 15 mg via INTRAVENOUS
  Filled 2012-01-27 (×11): qty 1

## 2012-01-27 NOTE — Progress Notes (Signed)
[facility] PROGRESS NOTE  SPIRITUAL Shannon Charles:[id number] DOB: 1927-05-03 DOA: 01/22/2012 PCP: Romero Belling, MD  Assessment/Plan: TIA versus CVA but TIA most likely given negative workup at this juncture: Patient has risk factors of HTN, HLD, DM.  - Continue to monitor on telemetry.  No red flags reported. - CT/MRI: Without acute abnormality - Carotid duplex shows no evidence of significant internal carotid artery stenosis - ECHO shows EF of 65-70% with grade 1 diastolic dysfunction - A1c is 6.4 - Lipid panel reviewed. - Hold ASA given GIB  - PT evaluation reviewed. No equipment recommendations. Recommended SNF on d/c  Symptomatic anemia with tachycardia and dypsnea. Likely due to GI blood loss with upper more likely given melena. DDx includes gastritis, PUD, AVM, portal hypertensive gastropathy  - Tx 2 units PRBC  Hemoglobin after transfusion 8.6 and has been steady at this juncture - Will need GI follow up as outpatient. - UTI may be contributing.  Will continue Ceftriaxone - hemoglobin trending up.  Tachycardia and tachypnea: Most likely due to pain, GIB and symptomatic anemia.  No evidence of wheeze or rales on exam and CXR negative for pneumonia. Restrictive lung disease likely contributing.  - Telemetry  - CE x 3 negative - Wean oxygen as tolerated  - Will obtain repeat EKG 01/24/12 - Place on IVF's and reassess next am. - TSH WNL's at this time. - improved with B blocker on board.  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 Ammonia level improved from prior, but at risk for SBP which may have contributed to weakness and fall last night.  - Continue rifaximin  - Continue lasix  - Restart lower dose lactulose  - Abd [place] ordered initially which showed small to moderate volume of scattered abd. ascities  UTI: Positive nitrites  - U/C pending but has > 100,000 cfu of E coli.  - WBC up today - Expand antibiotic regimen to Vanc and Zosyn at this juncture given that  patient meets sirs criteria with source of infection. - Given sensitivities and improved status will change antibiotic regimen to levaquin 01/26/12.  Currently improved on levaquin  DM - Continue current regimen -stable currently. - blood sugars likely exacerbated initially due to infection.  HTN/HLD. Blood pressure stable to elevated  - Continue statin  - Patient' BP elevated today will start patient on metoprolol tartrate low dose 01/24/12  CKD with mild AKI:  - Improved with IVF's and seems to be at base line at this juncture.  Will administer IVF's and reevaluate.  Chronic pain, currently uncontrolled:  -Continue elavil, oxycodone prn   Dementia, exacerbated by acute infection, possible TIA:  -Continue memantine and donepezil   Depression/anxiety:  -Stable, continue zoloft   Hypothyroidism:  -Stable, continue synthroid.    T12 compression fracture - Consulted Orthopaedic surgeon yesterday 01/26/12 for further evaluation and recommendations.   - Patient will be evaluated further for possible procedure tomorrow.  Have discussed with husband and grand daughter. - I would like to thank ortho for their input and help in this case.   Code Status: DNR Family Communication: Husband at bedside and grand daughter Delaney Meigs at [phone] Disposition Plan:   To SNF once bed available and cleared by ortho (from their standpoint after planned procedure)  Consultants:  none  Procedures:  MRI, US abdomen, chest x ray, CT head  Antibiotics:  Continue current antibiotic regimen.  HPI/Subjective: No new complaints today.  Still having back discomfort. No acute issues reported overnight.  Objective: Filed Vitals:  01/26/12 2247 01/27/12 0200 01/27/12 0600 01/27/12 1011  BP: 132/62 116/48 135/54 122/50  Pulse:  86 93 93  Temp:  98.2 F (36.8 C) 98.1 F (36.7 C) 97.8 F (36.6 C)  TempSrc:  Oral Oral Oral  Resp:  18 18 18   Height:      Weight:      SpO2:  100% 99% 100%   No  intake or output data in the 24 hours ending 01/27/12 1328 Filed Weights   01/22/12 1432  Weight: 92.987 kg (205 lb)    Exam:   General:  Pt in NAD, Alert and Awake  Cardiovascular: RRR, No MRG  Respiratory: Breath sounds BL, no wheezes  Abdomen: soft, NT  Data Reviewed: Basic Metabolic Panel:  Lab 01/27/12 1610 01/26/12 0550 01/25/12 1255 01/25/12 0630 01/24/12 0455 01/23/12 0600  NA 144 151* -- 144 143 144  K 3.3* 3.5 -- 3.3* 3.1* 3.7  CL 103 108 -- 103 103 106  CO2 35* 35* -- 35* 32 29  GLUCOSE 80 89 -- 75 100* 129*  BUN 21 19 -- 18 20 25*  CREATININE 1.18* 1.20* -- 1.22* 1.14* 1.33*  CALCIUM 8.4 8.3* -- 8.5 8.3* 8.6  MG -- -- 2.1 -- -- --  PHOS -- -- 3.8 -- -- --   Liver Function Tests:  Lab 01/22/12 1646  AST 38*  ALT 16  ALKPHOS 102  BILITOT 0.6  PROT 6.9  ALBUMIN 2.8*   No results found for this basename: LIPASE:5,AMYLASE:5 in the last 168 hours  Lab 01/22/12 1645  AMMONIA 53   CBC:  Lab 01/27/12 0615 01/26/12 0550 01/25/12 0630 01/24/12 0455 01/23/12 1944 01/23/12 0600 01/22/12 1446  WBC 7.0 9.5 11.2* 11.8* -- 10.0 --  NEUTROABS -- -- -- -- -- -- 9.0*  HGB 8.9* 8.9* 8.8* 8.6* 8.5* -- --  HCT 31.3* 30.2* 29.1* 27.7* 27.3* -- --  MCV 90.2 89.1 87.7 85.5 -- 87.2 --  PLT 108* 113* 113* 110* -- 138* --   Cardiac Enzymes:  Lab 01/23/12 0600 01/23/12 0025 01/22/12 1931 01/22/12 1447  CKTOTAL -- -- -- --  CKMB -- -- -- --  CKMBINDEX -- -- -- --  TROPONINI <0.30 <0.30 <0.30 <0.30   BNP (last 3 results)  Basename 12/05/11 1354 11/07/11 1531 07/02/11 1517  PROBNP 132.0* 184.7 52.0   CBG:  Lab 01/27/12 0635 01/26/12 2130 01/26/12 1719 01/26/12 1653 01/26/12 1638  GLUCAP 89 167* 116* 45* 34*    Recent Results (from the past 240 hour(s))  URINE CULTURE     Status: Normal   Collection Time   01/22/12  4:32 PM      Component Value Range Status Comment   Specimen Description URINE, CATHETERIZED   Final    Special Requests NONE   Final     Culture  Setup Time 01/22/2012 19:13   Final    Colony Count >=100,000 COLONIES/ML   Final    Culture ESCHERICHIA COLI   Final    Report Status 01/24/2012 FINAL   Final    Organism ID, Bacteria ESCHERICHIA COLI   Final      Studies: Dg Lumbar Spine Complete  01/26/2012  *RADIOLOGY REPORT*  Clinical Data: Lower back pain  LUMBAR SPINE - COMPLETE 4+ VIEW  Comparison: 07/06/2010  Findings: Normal alignment of the lumbar spine.  There is a moderate to severe compression deformity involving the T12 vertebra.  This is new since 07/06/2010.  The remaining visualized vertebral body heights are preserved.  Mild degenerative disc disease is noted at L4-5 and L5 S1.  IMPRESSION:  1.  T12 compression fracture is new from 07/06/2010.   Original Report Authenticated By: Signa Kell, M.D.     Scheduled Meds:    . amitriptyline  10 mg Oral QHS  . dextrose  1 ampule Intravenous Once  . [COMPLETED] dextrose      . donepezil  10 mg Oral QHS  . furosemide  40 mg Oral TID  . insulin aspart  0-9 Units Subcutaneous TID WC  . insulin glargine  35 Units Subcutaneous Daily  . [COMPLETED] ketorolac  15 mg Intravenous Once  . lactulose  20 g Oral Daily  . latanoprost  1 drop Both Eyes QHS  . levofloxacin  250 mg Oral Daily  . levothyroxine  175 mcg Oral QAC breakfast  . memantine  10 mg Oral BID  . metoprolol tartrate  12.5 mg Oral BID  . [COMPLETED] oxyCODONE  5 mg Oral Once  . rifaximin  200 mg Oral TID  . sertraline  50 mg Oral BID  . simvastatin  40 mg Oral QPM  . [DISCONTINUED] insulin aspart  3 Units Subcutaneous TID WC   Continuous Infusions:    . sodium chloride 75 mL/hr at 01/27/12 1316    Principal Problem:  *TIA (transient ischemic attack) Active Problems:  HYPOTHYROIDISM  HYPERLIPIDEMIA  DEMENTIA  HYPERTENSION  BACK PAIN, LUMBAR  Bell's palsy  Hepatic encephalopathy  Diabetes mellitus  Cirrhosis  Pulmonary edema  Anasarca  Lung disease, restrictive  Anemia, blood  loss    Time spent: > 35 minutes    Penny Pia  [facility] Pager [phone]. If 8PM-8AM, please contact night-coverage at [email or web address], password [facility] 01/27/2012, 1:28 PM  LOS: 5 days

## 2012-01-27 NOTE — Consult Note (Addendum)
Reason for Consult:   Severe back pain Referring Physician:    Dr Shannon Shannon Charles is an 78 y.o. female  Who is beset with multiple medical issues.  Has had some arthritic pain in her back for several months but was made worse recently after a fall.  At this point is basically bedridden due to extreme pain.  Able to void and control bladder but also has UTI under treatment currently.  Has not tried back brace as of yet.  Planning to transfer to nursing facility in 1-2 days.    Past Medical History  Diagnosis Date  . DEMENTIA 08/23/2006  . DEPRESSION 08/23/2006  . DIABETES MELLITUS, TYPE I 08/23/2006  . HYPERLIPIDEMIA 08/23/2006  . HYPERTENSION 08/23/2006  . HYPOTHYROIDISM 08/23/2006  . OSTEOARTHRITIS 08/23/2006  . RENAL INSUFFICIENCY 08/23/2006  . Personal history of colonic polyps 04/29/2007  . Lung disease, restrictive     Mild, by pulmonary function test in October 2006 with mildly reduce DLCO  . Pain in limb   . Chronic pain syndrome   . Lack of coordination   . Lumbago   . Primary localized osteoarthrosis, lower leg   . Disorders of bursae and tendons in shoulder region, unspecified   . Bell's palsy     per Husband  . Cirrhosis     Past Surgical History  Procedure Date  . Total abdominal hysterectomy   . Colonoscopy w/ polypectomy     Family History  Problem Relation Age of Onset  . Cancer Sister     uncertain type  . Cirrhosis Mother     Social History:  reports that she has never smoked. She does not have any smokeless tobacco history on file. She reports that she does not drink alcohol or use illicit drugs.  Allergies: No Known Allergies  Medications: I have reviewed the patient's current medications.  Results for orders placed during the hospital encounter of 01/22/12 (from the past 48 hour(s))  GLUCOSE, CAPILLARY     Status: Abnormal   Collection Time   01/25/12 11:36 AM      Component Value Range Comment   Glucose-Capillary 102 (*) 70 - 99 mg/dL   PHOSPHORUS      Status: Normal   Collection Time   01/25/12 12:55 PM      Component Value Range Comment   Phosphorus 3.8  2.3 - 4.6 mg/dL   MAGNESIUM     Status: Normal   Collection Time   01/25/12 12:55 PM      Component Value Range Comment   Magnesium 2.1  1.5 - 2.5 mg/dL   OCCULT BLOOD X 1 CARD TO LAB, STOOL     Status: Normal   Collection Time   01/25/12  3:01 PM      Component Value Range Comment   Fecal Occult Bld POSITIVE     GLUCOSE, CAPILLARY     Status: Abnormal   Collection Time   01/25/12  4:39 PM      Component Value Range Comment   Glucose-Capillary 100 (*) 70 - 99 mg/dL   GLUCOSE, CAPILLARY     Status: Abnormal   Collection Time   01/25/12  9:25 PM      Component Value Range Comment   Glucose-Capillary 151 (*) 70 - 99 mg/dL    Comment 1 Notify RN     CBC     Status: Abnormal   Collection Time   01/26/12  5:50 AM      Component Value  Range Comment   WBC 9.5  4.0 - 10.5 K/uL    RBC 3.39 (*) 3.87 - 5.11 MIL/uL    Hemoglobin 8.9 (*) 12.0 - 15.0 Shannon Charles/dL    HCT 16.1 (*) 09.6 - 46.0 %    MCV 89.1  78.0 - 100.0 fL    MCH 26.3  26.0 - 34.0 pg    MCHC 29.5 (*) 30.0 - 36.0 Shannon Charles/dL    RDW 04.5 (*) 40.9 - 15.5 %    Platelets 113 (*) 150 - 400 K/uL CONSISTENT WITH PREVIOUS RESULT  BASIC METABOLIC PANEL     Status: Abnormal   Collection Time   01/26/12  5:50 AM      Component Value Range Comment   Sodium 151 (*) 135 - 145 mEq/L    Potassium 3.5  3.5 - 5.1 mEq/L    Chloride 108  96 - 112 mEq/L    CO2 35 (*) 19 - 32 mEq/L    Glucose, Bld 89  70 - 99 mg/dL    BUN 19  6 - 23 mg/dL    Creatinine, Ser 8.11 (*) 0.50 - 1.10 mg/dL    Calcium 8.3 (*) 8.4 - 10.5 mg/dL    GFR calc non Af Amer 41 (*) >90 mL/min    GFR calc Af Amer 47 (*) >90 mL/min   GLUCOSE, CAPILLARY     Status: Normal   Collection Time   01/26/12  6:46 AM      Component Value Range Comment   Glucose-Capillary 86  70 - 99 mg/dL   GLUCOSE, CAPILLARY     Status: Abnormal   Collection Time   01/26/12 11:33 AM      Component Value  Range Comment   Glucose-Capillary 108 (*) 70 - 99 mg/dL   GLUCOSE, CAPILLARY     Status: Abnormal   Collection Time   01/26/12  4:38 PM      Component Value Range Comment   Glucose-Capillary 34 (*) 70 - 99 mg/dL    Comment 1 Documented in Chart     GLUCOSE, CAPILLARY     Status: Abnormal   Collection Time   01/26/12  4:53 PM      Component Value Range Comment   Glucose-Capillary 45 (*) 70 - 99 mg/dL   GLUCOSE, CAPILLARY     Status: Abnormal   Collection Time   01/26/12  5:19 PM      Component Value Range Comment   Glucose-Capillary 116 (*) 70 - 99 mg/dL   GLUCOSE, CAPILLARY     Status: Abnormal   Collection Time   01/26/12  9:30 PM      Component Value Range Comment   Glucose-Capillary 167 (*) 70 - 99 mg/dL    Comment 1 Documented in Chart      Comment 2 Notify RN     GLUCOSE, CAPILLARY     Status: Normal   Collection Time   01/27/12  6:35 AM      Component Value Range Comment   Glucose-Capillary 89  70 - 99 mg/dL    Comment 1 Documented in Chart      Comment 2 Notify RN       Dg Lumbar Spine Complete  01/26/2012  *RADIOLOGY REPORT*  Clinical Data: Lower back pain  LUMBAR SPINE - COMPLETE 4+ VIEW  Comparison: 07/06/2010  Findings: Normal alignment of the lumbar spine.  There is a moderate to severe compression deformity involving the T12 vertebra.  This is new since 07/06/2010.  The  remaining visualized vertebral body heights are preserved.  Mild degenerative disc disease is noted at L4-5 and L5 S1.  IMPRESSION:  1.  T12 compression fracture is new from 07/06/2010.   Original Report Authenticated By: Shannon Shannon Charles, M.D.     @ROS @ Blood pressure 135/54, pulse 93, temperature 98.1 F (36.7 C), temperature source Oral, resp. rate 18, height 5\' 2"  (1.575 m), weight 92.987 kg (205 lb), SpO2 99.00%.  PHYSICAL EXAM:   Neurologically intact ABD soft Sensation intact distally Intact pulses distally Dorsiflexion/Plantar flexion intact  ASSESSMENT:   T12 compression fracture  PLAN:    Looks new compared to films from last year.  Other vertebral bodies look well maintained.  In extreme pain and I think we have a good chance to make this somewhat better with a kyphoplasty procedure at that one level.  I don't think a brace will lead to any great improvement.  Will try to consult interventional radiology this morning to see if they might be able to do this today in hopes that she might be able to go to nursing care facility as planned in 1-2 days.  ADDENDUM: Spoke with radiology and kyphoplasties are not typically done on weekend and they need an MRI as well to get this done.  Consequently will set up with MRI today without contrast and plan kyphoplasty tomorrow by probably Dr Corliss Skains.   Shannon Shannon Charles 01/27/2012, 7:39 AM

## 2012-01-27 NOTE — Progress Notes (Signed)
Pt complaining of severe pain in mid back upon initial assessment. Pt near tears and moving in bed was "nearly unbearable", 9/10. Pt repositioned multiple times and given heat packs. Pt states that her back has never felt like this. RN reviewed the results of x-ray done earlier in day. X-ray was positive for a T12 fx. Elray Mcgregor, NP regarding pt's severe pain and the results of x-ray. Oxy IR 5mg  ordered. Pt given medicine at 2124. Upon reassessing pain, pt states that pain was unchanged. Elray Mcgregor NP paged again. Toradol IV 15mg  ordered to be given once. Pt given medicine at 2350. RN re-assessed pt and pain was "bearable" at a 6/10. Pt is now sleeping and has slept through most of night. Will continue to monitor pt's pain. Salvadore Oxford, RN 01/27/12 [phone]

## 2012-01-27 NOTE — Progress Notes (Signed)
Patient ID: Shannon Charles, female   DOB: 07-Feb-1928, 78 y.o.   MRN: [id number] Request received for potential T12 VP/KP on pt with symptomatic T12 compression fracture s/p recent fall. Pt is scheduled for MRI today to further evaluate candidacy for procedure. Pt with recent UTI and currently on antibiotic therapy. Will need to recheck culture to ensure no active infection before proceeding with case as well as insurance coverage. Will plan to review case with Dr. Corliss Skains in am and schedule if appropriate.

## 2012-01-28 ENCOUNTER — Ambulatory Visit: Payer: Medicare Other | Admitting: Endocrinology

## 2012-01-28 ENCOUNTER — Encounter (HOSPITAL_COMMUNITY): Payer: Self-pay | Admitting: Radiology

## 2012-01-28 DIAGNOSIS — Z0289 Encounter for other administrative examinations: Secondary | ICD-10-CM

## 2012-01-28 LAB — URINALYSIS, ROUTINE W REFLEX MICROSCOPIC
Glucose, UA: NEGATIVE mg/dL
Hgb urine dipstick: NEGATIVE
Specific Gravity, Urine: 1.015 (ref 1.005–1.030)
Urobilinogen, UA: 0.2 mg/dL (ref 0.0–1.0)

## 2012-01-28 LAB — BASIC METABOLIC PANEL
CO2: 34 mEq/L — ABNORMAL HIGH (ref 19–32)
Glucose, Bld: 72 mg/dL (ref 70–99)
Potassium: 3.1 mEq/L — ABNORMAL LOW (ref 3.5–5.1)
Sodium: 147 mEq/L — ABNORMAL HIGH (ref 135–145)

## 2012-01-28 LAB — APTT: aPTT: 42 seconds — ABNORMAL HIGH (ref 24–37)

## 2012-01-28 LAB — GLUCOSE, CAPILLARY
Glucose-Capillary: 70 mg/dL (ref 70–99)
Glucose-Capillary: 94 mg/dL (ref 70–99)

## 2012-01-28 LAB — CBC
HCT: 30.2 % — ABNORMAL LOW (ref 36.0–46.0)
Hemoglobin: 8.8 g/dL — ABNORMAL LOW (ref 12.0–15.0)
MCH: 25.9 pg — ABNORMAL LOW (ref 26.0–34.0)
RBC: 3.4 MIL/uL — ABNORMAL LOW (ref 3.87–5.11)

## 2012-01-28 LAB — PROTIME-INR: INR: 1.37 (ref 0.00–1.49)

## 2012-01-28 MED ORDER — KETOROLAC TROMETHAMINE 30 MG/ML IJ SOLN
INTRAMUSCULAR | Status: AC
  Administered 2012-01-28: 15 mg
  Filled 2012-01-28: qty 1

## 2012-01-28 NOTE — Progress Notes (Signed)
[facility] PROGRESS NOTE  Shannon Charles HQI:[id number] DOB: 27-Nov-1927 DOA: 01/22/2012 PCP: Romero Belling, MD  Assessment/Plan: TIA given negative work up (CT/MRI) for stroke - Continue to monitor on telemetry.  No red flags reported. - CT/MRI: Without acute abnormality - Carotid duplex shows no evidence of significant internal carotid artery stenosis - ECHO shows EF of 65-70% with grade 1 diastolic dysfunction - A1c is 6.4 - Lipid panel reviewed. - Hold ASA given GIB  - PT evaluation reviewed. No equipment recommendations. Recommended SNF on d/c  Symptomatic anemia with tachycardia and dypsnea. Likely due to GI blood loss with upper more likely given melena. DDx includes gastritis, PUD, AVM, portal hypertensive gastropathy  - Tx 2 units PRBC  Hemoglobin after transfusion 8.6 and has been steady at this juncture - Will need GI follow up as outpatient. Plan on calling GI and helping patient set up appointment once she transitions out of the hospital. - UTI may be contributing.  Will continue levaquin per sensitivities - hemoglobin trending up.  Tachycardia and tachypnea: Most likely due to pain from back and UTI.  No evidence of wheeze or rales on exam and CXR negative for pneumonia. Restrictive lung disease likely contributing.  - Telemetry  - CE x 3 negative - Wean oxygen as tolerated  - TSH WNL's at this time. - improved with B blocker on board. 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 Ammonia level improved from prior, but at risk for SBP which may have contributed to weakness and fall last night.  - Continue rifaximin  - Continue lasix  - Restart lower dose lactulose  - Abd [place] ordered initially which showed small to moderate volume of scattered abd. ascities  UTI: Positive nitrites  - U/C resulted  > 100,000 cfu of E coli, antibiotics have been adjusted accordingly. - Repeating U/A today for clearance.   Patient has been on antibiotics for the last 6 days.  Would shoot for a 7-10 day course.  DM - Continue current regimen - stable currently. - blood sugars likely exacerbated initially due to infection.  HTN/HLD. Blood pressure stable to elevated  - Continue statin  - Patient' BP elevated today will start patient on metoprolol tartrate low dose 01/24/12  CKD with mild AKI:  - Improved with IVF's and seems to be at base line at this juncture.  Will administer IVF's and reevaluate.  Chronic pain, currently uncontrolled:  -Continue elavil, oxycodone prn   Dementia, exacerbated by acute infection, possible TIA:  -Continue memantine and donepezil   Depression/anxiety:  -Stable, continue zoloft   Hypothyroidism:  -Stable, continue synthroid.    T12 compression fracture - Consulted Orthopaedic surgeon yesterday 01/26/12 for further evaluation and recommendations.   - Patient will be evaluated further for possible kyphoplasty at T 12.  Have discussed with husband. - I would like to thank ortho for their input and help in this case.   Code Status: DNR Family Communication: Husband at bedside 01/28/12.  Have indicated to husband to update grand daughter Delaney Meigs at [phone] Disposition Plan:   To SNF once bed available and cleared by ortho (from their standpoint after planned procedure)  Consultants:  none  Procedures:  MRI, US abdomen, chest x ray, CT head  Antibiotics:  Continue current antibiotic regimen.  HPI/Subjective No new complaints today.  Still having back discomfort. No acute issues reported overnight.  Objective: Filed Vitals:   01/27/12 2202 01/28/12 0200 01/28/12 0532 01/28/12  1005  BP: 126/58 123/52 151/62 151/61  Pulse: 97 81 90 98  Temp: 98 F (36.7 C) 98.2 F (36.8 C) 97.9 F (36.6 C) 98 F (36.7 C)  TempSrc: Oral Oral Axillary Oral  Resp: 20 20 20 18   Height:      Weight:      SpO2: 98% 98% 96% 98%    Intake/Output Summary (Last 24 hours) at  01/28/12 1109 Last data filed at 01/27/12 1700  Gross per 24 hour  Intake    480 ml  Output      0 ml  Net    480 ml   Filed Weights   01/22/12 1432  Weight: 92.987 kg (205 lb)    Exam:   General:  Pt in NAD, Alert and Awake  Cardiovascular: RRR, No MRG  Respiratory: Breath sounds BL, no wheezes  Abdomen: soft, NT  Data Reviewed: Basic Metabolic Panel:  Lab 01/28/12 1610 01/27/12 0615 01/26/12 0550 01/25/12 1255 01/25/12 0630 01/24/12 0455  NA 147* 144 151* -- 144 143  K 3.1* 3.3* 3.5 -- 3.3* 3.1*  CL 107 103 108 -- 103 103  CO2 34* 35* 35* -- 35* 32  GLUCOSE 72 80 89 -- 75 100*  BUN 20 21 19  -- 18 20  CREATININE 1.11* 1.18* 1.20* -- 1.22* 1.14*  CALCIUM 8.4 8.4 8.3* -- 8.5 8.3*  MG -- -- -- 2.1 -- --  PHOS -- -- -- 3.8 -- --   Liver Function Tests:  Lab 01/22/12 1646  AST 38*  ALT 16  ALKPHOS 102  BILITOT 0.6  PROT 6.9  ALBUMIN 2.8*   No results found for this basename: LIPASE:5,AMYLASE:5 in the last 168 hours  Lab 01/22/12 1645  AMMONIA 53   CBC:  Lab 01/28/12 0545 01/27/12 0615 01/26/12 0550 01/25/12 0630 01/24/12 0455 01/22/12 1446  WBC 8.2 7.0 9.5 11.2* 11.8* --  NEUTROABS -- -- -- -- -- 9.0*  HGB 8.8* 8.9* 8.9* 8.8* 8.6* --  HCT 30.2* 31.3* 30.2* 29.1* 27.7* --  MCV 88.8 90.2 89.1 87.7 85.5 --  PLT 97* 108* 113* 113* 110* --   Cardiac Enzymes:  Lab 01/23/12 0600 01/23/12 0025 01/22/12 1931 01/22/12 1447  CKTOTAL -- -- -- --  CKMB -- -- -- --  CKMBINDEX -- -- -- --  TROPONINI <0.30 <0.30 <0.30 <0.30   BNP (last 3 results)  Basename 12/05/11 1354 11/07/11 1531 07/02/11 1517  PROBNP 132.0* 184.7 52.0   CBG:  Lab 01/28/12 0651 01/27/12 2209 01/27/12 1603 01/27/12 1145 01/27/12 0635  GLUCAP 81 93 113* 136* 89    Recent Results (from the past 240 hour(s))  URINE CULTURE     Status: Normal   Collection Time   01/22/12  4:32 PM      Component Value Range Status Comment   Specimen Description URINE, CATHETERIZED   Final    Special  Requests NONE   Final    Culture  Setup Time 01/22/2012 19:13   Final    Colony Count >=100,000 COLONIES/ML   Final    Culture ESCHERICHIA COLI   Final    Report Status 01/24/2012 FINAL   Final    Organism ID, Bacteria ESCHERICHIA COLI   Final   SURGICAL PCR SCREEN     Status: Normal   Collection Time   01/28/12  5:23 AM      Component Value Range Status Comment   MRSA, PCR NEGATIVE  NEGATIVE Final    Staphylococcus aureus NEGATIVE  NEGATIVE  Final      Studies: Dg Lumbar Spine Complete  01/26/2012  *RADIOLOGY REPORT*  Clinical Data: Lower back pain  LUMBAR SPINE - COMPLETE 4+ VIEW  Comparison: 07/06/2010  Findings: Normal alignment of the lumbar spine.  There is a moderate to severe compression deformity involving the T12 vertebra.  This is new since 07/06/2010.  The remaining visualized vertebral body heights are preserved.  Mild degenerative disc disease is noted at L4-5 and L5 S1.  IMPRESSION:  1.  T12 compression fracture is new from 07/06/2010.   Original Report Authenticated By: Signa Kell, M.D.    Mr Lumbar Spine Wo Contrast  01/27/2012  *RADIOLOGY REPORT*  Clinical Data: Chronic low level back pain with marked increase in severity following a recent fall.  MRI LUMBAR SPINE WITHOUT CONTRAST  Technique:  Multiplanar and multiecho pulse sequences of the lumbar spine were obtained without intravenous contrast.  Comparison: MRI lumbar spine 07/06/2010.  Plain films 01/26/2012.  Findings: The patient had difficulty remaining motionless for the study.  Images are suboptimal.  Small or subtle lesions could be overlooked.  Five lumbar type vertebral bodies assumed.  Anatomic alignment. Scan extends from T9-10 disc space through the upper sacrum.  There is diffuse decreased signal of the visualized bone marrow. While a widespread neoplastic process is not excluded, the findings likely relate to severe chronic disease of cirrhosis along with anemia.  Correlate clinically.  There is an acute  compression fracture of T12 with biconcave deformity of the superior and inferior endplates.  Slight anterior wedging is noted.  Prolonged T1 and T2 signal within the T12 vertebral body indicative of acute bone marrow edema.  Minimal retropulsion posteriorly.  Adequate diameter of the spinal canal without epidural hematoma or conus compression.  There is no visible lumbar disc protrusion.  Mild spinal stenosis at L3-4 and L4-5 relates to facet arthropathy and ligamentum flavum hypertrophy.  Mild annular bulging L5-S1. Schmorl's nodes result in endplate reactive changes above and below L4-5.  Compared with 07/06/2010, the T12 fracture was not present. The marrow changes have progressed since that time.  IMPRESSION: Diffuse decreased signal of the visualized bone marrow likely related to chronic disease/anemia.  Acute compression fracture T12 with biconcave deformity of the superior and inferior endplates.  No significant retropulsion.   Original Report Authenticated By: Davonna Belling, M.D.     Scheduled Meds:    . amitriptyline  10 mg Oral QHS  . dextrose  1 ampule Intravenous Once  . donepezil  10 mg Oral QHS  . furosemide  40 mg Oral TID  . insulin aspart  0-9 Units Subcutaneous TID WC  . insulin glargine  35 Units Subcutaneous Daily  . [COMPLETED] ketorolac      . lactulose  20 g Oral Daily  . latanoprost  1 drop Both Eyes QHS  . levofloxacin  250 mg Oral Daily  . levothyroxine  175 mcg Oral QAC breakfast  . memantine  10 mg Oral BID  . metoprolol tartrate  12.5 mg Oral BID  . rifaximin  200 mg Oral TID  . sertraline  50 mg Oral BID  . simvastatin  40 mg Oral QPM   Continuous Infusions:    . sodium chloride 75 mL/hr at 01/27/12 1316    Principal Problem:  *TIA (transient ischemic attack) Active Problems:  HYPOTHYROIDISM  HYPERLIPIDEMIA  DEMENTIA  HYPERTENSION  BACK PAIN, LUMBAR  Bell's palsy  Hepatic encephalopathy  Diabetes mellitus  Cirrhosis  Pulmonary edema  Anasarca  Lung disease, restrictive  Anemia, blood loss  T12 compression fracture    Time spent: > 35 minutes    Penny Pia  [facility] Pager [phone]. If 8PM-8AM, please contact night-coverage at [email or web address], password [facility] 01/28/2012, 11:09 AM  LOS: 6 days

## 2012-01-28 NOTE — Progress Notes (Signed)
Patient is currently active with long-term disease management services with [facility]. Patient will continued to be followed by [facility] Care Management post discharge. [facility] Care Coordinator made aware of likely discharge to SNF.     Raiford Noble, MSN- Ed, RN,BSN [facility] Liaison [phone]

## 2012-01-28 NOTE — Progress Notes (Signed)
PT Cancellation Note  Patient Details Name: MARKITTA AUSBURN MRN: [id number] DOB: 09/23/1927   Cancelled Treatment:     Pt going for kyphoplasty today.      Verdell Face, [place] [phone] 01/28/2012

## 2012-01-28 NOTE — Progress Notes (Signed)
Spoke to someone in intervention Radiology(James) concerning the procedure, was told still waiting on insurance pre-certification which takes a day,therefore patient can be d/ced from Npo today and put her back tonight. Will do that as instructed.

## 2012-01-28 NOTE — H&P (Signed)
Shannon Charles is an 78 y.o. female.   Chief Complaint: Mid to low back pain post fall at home last week Increased confusion; dementia Hospitalized and work up reveals Thoracic 12 fracture. MRI has been reviewed by Dr Corliss Skains and is amenable to kyphoplasty procedure Pt has been being treated for UTI since admission 01/22/12 HPI: AMS/dementia; DM; HLD; KTN; hypothyroid; Cirrhosis  Past Medical History  Diagnosis Date  . DEMENTIA 08/23/2006  . DEPRESSION 08/23/2006  . DIABETES MELLITUS, TYPE I 08/23/2006  . HYPERLIPIDEMIA 08/23/2006  . HYPERTENSION 08/23/2006  . HYPOTHYROIDISM 08/23/2006  . OSTEOARTHRITIS 08/23/2006  . RENAL INSUFFICIENCY 08/23/2006  . Personal history of colonic polyps 04/29/2007  . Lung disease, restrictive     Mild, by pulmonary function test in October 2006 with mildly reduce DLCO  . Pain in limb   . Chronic pain syndrome   . Lack of coordination   . Lumbago   . Primary localized osteoarthrosis, lower leg   . Disorders of bursae and tendons in shoulder region, unspecified   . Bell's palsy     per Husband  . Cirrhosis     Past Surgical History  Procedure Date  . Total abdominal hysterectomy   . Colonoscopy w/ polypectomy     Family History  Problem Relation Age of Onset  . Cancer Sister     uncertain type  . Cirrhosis Mother    Social History:  reports that she has never smoked. She does not have any smokeless tobacco history on file. She reports that she does not drink alcohol or use illicit drugs.  Allergies: No Known Allergies  Medications Prior to Admission  Medication Sig Dispense Refill  . amitriptyline (ELAVIL) 25 MG tablet Take 25 mg by mouth at bedtime.      Marland Kitchen aspirin 81 MG tablet Take 81 mg by mouth daily.      Marland Kitchen donepezil (ARICEPT) 10 MG tablet Take 10 mg by mouth daily.        . dorzolamide-timolol (COSOPT) 22.3-6.8 MG/ML ophthalmic solution Place 1 drop into both eyes 2 (two) times daily.      . furosemide (LASIX) 40 MG tablet Take 1 tablet  (40 mg total) by mouth 3 (three) times daily.  90 tablet  3  . insulin NPH (HUMULIN N PEN) 100 UNIT/ML injection Inject 85 Units into the skin every morning.  90 mL  12  . latanoprost (XALATAN) 0.005 % ophthalmic solution Place 1 drop into both eyes at bedtime.      Marland Kitchen levothyroxine (SYNTHROID, LEVOTHROID) 200 MCG tablet Take 200 mcg by mouth daily.      Marland Kitchen LORazepam (ATIVAN) 0.5 MG tablet Take 1 tablet (0.5 mg total) by mouth 2 (two) times daily as needed. For anxiety  10 tablet  0  . losartan (COZAAR) 100 MG tablet Take 100 mg by mouth daily.      . memantine (NAMENDA) 10 MG tablet Take 1 tablet (10 mg total) by mouth 2 (two) times daily.  180 tablet  11  . oxyCODONE (OXY IR/ROXICODONE) 5 MG immediate release tablet Take 1 tablet (5 mg total) by mouth every 8 (eight) hours as needed for pain.  90 tablet  0  . potassium chloride SA (K-DUR,KLOR-CON) 20 MEQ tablet Take 20 mEq by mouth 3 (three) times daily.      . sertraline (ZOLOFT) 50 MG tablet Take 100 mg by mouth daily.      . simvastatin (ZOCOR) 40 MG tablet Take 40 mg by mouth every  evening.      . Vitamin D, Ergocalciferol, (DRISDOL) 50000 UNITS CAPS Take 50,000 Units by mouth every 7 (seven) days.        Results for orders placed during the hospital encounter of 01/22/12 (from the past 48 hour(s))  GLUCOSE, CAPILLARY     Status: Abnormal   Collection Time   01/26/12 11:33 AM      Component Value Range Comment   Glucose-Capillary 108 (*) 70 - 99 mg/dL   GLUCOSE, CAPILLARY     Status: Abnormal   Collection Time   01/26/12  4:38 PM      Component Value Range Comment   Glucose-Capillary 34 (*) 70 - 99 mg/dL    Comment 1 Documented in Chart     GLUCOSE, CAPILLARY     Status: Abnormal   Collection Time   01/26/12  4:53 PM      Component Value Range Comment   Glucose-Capillary 45 (*) 70 - 99 mg/dL   GLUCOSE, CAPILLARY     Status: Abnormal   Collection Time   01/26/12  5:19 PM      Component Value Range Comment   Glucose-Capillary 116 (*)  70 - 99 mg/dL   GLUCOSE, CAPILLARY     Status: Abnormal   Collection Time   01/26/12  9:30 PM      Component Value Range Comment   Glucose-Capillary 167 (*) 70 - 99 mg/dL    Comment 1 Documented in Chart      Comment 2 Notify RN     CBC     Status: Abnormal   Collection Time   01/27/12  6:15 AM      Component Value Range Comment   WBC 7.0  4.0 - 10.5 K/uL    RBC 3.47 (*) 3.87 - 5.11 MIL/uL    Hemoglobin 8.9 (*) 12.0 - 15.0 g/dL    HCT 09.8 (*) 11.9 - 46.0 %    MCV 90.2  78.0 - 100.0 fL    MCH 25.6 (*) 26.0 - 34.0 pg    MCHC 28.4 (*) 30.0 - 36.0 g/dL    RDW 14.7 (*) 82.9 - 15.5 %    Platelets 108 (*) 150 - 400 K/uL   BASIC METABOLIC PANEL     Status: Abnormal   Collection Time   01/27/12  6:15 AM      Component Value Range Comment   Sodium 144  135 - 145 mEq/L    Potassium 3.3 (*) 3.5 - 5.1 mEq/L    Chloride 103  96 - 112 mEq/L    CO2 35 (*) 19 - 32 mEq/L    Glucose, Bld 80  70 - 99 mg/dL    BUN 21  6 - 23 mg/dL    Creatinine, Ser 5.62 (*) 0.50 - 1.10 mg/dL    Calcium 8.4  8.4 - 13.0 mg/dL    GFR calc non Af Amer 42 (*) >90 mL/min    GFR calc Af Amer 48 (*) >90 mL/min   GLUCOSE, CAPILLARY     Status: Normal   Collection Time   01/27/12  6:35 AM      Component Value Range Comment   Glucose-Capillary 89  70 - 99 mg/dL    Comment 1 Documented in Chart      Comment 2 Notify RN     GLUCOSE, CAPILLARY     Status: Abnormal   Collection Time   01/27/12 11:45 AM      Component Value Range Comment  Glucose-Capillary 136 (*) 70 - 99 mg/dL   GLUCOSE, CAPILLARY     Status: Abnormal   Collection Time   01/27/12  4:03 PM      Component Value Range Comment   Glucose-Capillary 113 (*) 70 - 99 mg/dL   GLUCOSE, CAPILLARY     Status: Normal   Collection Time   01/27/12 10:09 PM      Component Value Range Comment   Glucose-Capillary 93  70 - 99 mg/dL   SURGICAL PCR SCREEN     Status: Normal   Collection Time   01/28/12  5:23 AM      Component Value Range Comment   MRSA, PCR NEGATIVE   NEGATIVE    Staphylococcus aureus NEGATIVE  NEGATIVE   CBC     Status: Abnormal   Collection Time   01/28/12  5:45 AM      Component Value Range Comment   WBC 8.2  4.0 - 10.5 K/uL    RBC 3.40 (*) 3.87 - 5.11 MIL/uL    Hemoglobin 8.8 (*) 12.0 - 15.0 g/dL    HCT 86.5 (*) 78.4 - 46.0 %    MCV 88.8  78.0 - 100.0 fL    MCH 25.9 (*) 26.0 - 34.0 pg    MCHC 29.1 (*) 30.0 - 36.0 g/dL    RDW 69.6 (*) 29.5 - 15.5 %    Platelets 97 (*) 150 - 400 K/uL CONSISTENT WITH PREVIOUS RESULT  BASIC METABOLIC PANEL     Status: Abnormal   Collection Time   01/28/12  5:45 AM      Component Value Range Comment   Sodium 147 (*) 135 - 145 mEq/L    Potassium 3.1 (*) 3.5 - 5.1 mEq/L    Chloride 107  96 - 112 mEq/L    CO2 34 (*) 19 - 32 mEq/L    Glucose, Bld 72  70 - 99 mg/dL    BUN 20  6 - 23 mg/dL    Creatinine, Ser 2.84 (*) 0.50 - 1.10 mg/dL    Calcium 8.4  8.4 - 13.2 mg/dL    GFR calc non Af Amer 45 (*) >90 mL/min    GFR calc Af Amer 52 (*) >90 mL/min   PROTIME-INR     Status: Abnormal   Collection Time   01/28/12  5:45 AM      Component Value Range Comment   Prothrombin Time 16.5 (*) 11.6 - 15.2 seconds    INR 1.37  0.00 - 1.49   APTT     Status: Abnormal   Collection Time   01/28/12  5:45 AM      Component Value Range Comment   aPTT 42 (*) 24 - 37 seconds   GLUCOSE, CAPILLARY     Status: Normal   Collection Time   01/28/12  6:51 AM      Component Value Range Comment   Glucose-Capillary 81  70 - 99 mg/dL    Dg Lumbar Spine Complete  01/26/2012  *RADIOLOGY REPORT*  Clinical Data: Lower back pain  LUMBAR SPINE - COMPLETE 4+ VIEW  Comparison: 07/06/2010  Findings: Normal alignment of the lumbar spine.  There is a moderate to severe compression deformity involving the T12 vertebra.  This is new since 07/06/2010.  The remaining visualized vertebral body heights are preserved.  Mild degenerative disc disease is noted at L4-5 and L5 S1.  IMPRESSION:  1.  T12 compression fracture is new from 07/06/2010.    Original Report Authenticated By: Ladona Ridgel  Bradly Chris, M.D.    Mr Lumbar Spine Wo Contrast  01/27/2012  *RADIOLOGY REPORT*  Clinical Data: Chronic low level back pain with marked increase in severity following a recent fall.  MRI LUMBAR SPINE WITHOUT CONTRAST  Technique:  Multiplanar and multiecho pulse sequences of the lumbar spine were obtained without intravenous contrast.  Comparison: MRI lumbar spine 07/06/2010.  Plain films 01/26/2012.  Findings: The patient had difficulty remaining motionless for the study.  Images are suboptimal.  Small or subtle lesions could be overlooked.  Five lumbar type vertebral bodies assumed.  Anatomic alignment. Scan extends from T9-10 disc space through the upper sacrum.  There is diffuse decreased signal of the visualized bone marrow. While a widespread neoplastic process is not excluded, the findings likely relate to severe chronic disease of cirrhosis along with anemia.  Correlate clinically.  There is an acute compression fracture of T12 with biconcave deformity of the superior and inferior endplates.  Slight anterior wedging is noted.  Prolonged T1 and T2 signal within the T12 vertebral body indicative of acute bone marrow edema.  Minimal retropulsion posteriorly.  Adequate diameter of the spinal canal without epidural hematoma or conus compression.  There is no visible lumbar disc protrusion.  Mild spinal stenosis at L3-4 and L4-5 relates to facet arthropathy and ligamentum flavum hypertrophy.  Mild annular bulging L5-S1. Schmorl's nodes result in endplate reactive changes above and below L4-5.  Compared with 07/06/2010, the T12 fracture was not present. The marrow changes have progressed since that time.  IMPRESSION: Diffuse decreased signal of the visualized bone marrow likely related to chronic disease/anemia.  Acute compression fracture T12 with biconcave deformity of the superior and inferior endplates.  No significant retropulsion.   Original Report Authenticated By: Davonna Belling, M.D.     Review of Systems  Constitutional: Negative for fever.  Cardiovascular: Negative for chest pain.  Gastrointestinal: Negative for nausea and vomiting.  Musculoskeletal: Positive for back pain.  Neurological: Positive for weakness.    Blood pressure 151/62, pulse 90, temperature 97.9 F (36.6 C), temperature source Axillary, resp. rate 20, height 5\' 2"  (1.575 m), weight 205 lb (92.987 kg), SpO2 96.00%. Physical Exam  Constitutional: She is oriented to person, place, and time. She appears well-developed.  Cardiovascular: Normal rate, regular rhythm and normal heart sounds.   No murmur heard. Respiratory: Effort normal and breath sounds normal. She has no wheezes.  GI: Soft. Bowel sounds are normal. There is no tenderness.  Musculoskeletal: Normal range of motion.       Back pain; mid to low back  Neurological: She is alert and oriented to person, place, and time.  Psychiatric: She has a normal mood and affect. Her behavior is normal. Judgment and thought content normal.       Pt with dementia but seems to be aware of procedure and risks; consented husband also in room     Assessment/Plan T 12 fx; back pain MRI shows acute edema and amenable to KP per Dr Corliss Skains Tentatively scheduled for T12 KP - awaiting UA results and precertification of insurance Pt and husband aware of procedure benefits and risks and agreeable to proceed. Will keep NPO for now and intend to move forward when all labs and precert are in Will call when can  Lincoln Kleiner A 01/28/2012, 9:07 AM

## 2012-01-28 NOTE — Progress Notes (Signed)
Subjective:    T12 comp fx  Activity level:  In bed  Diet tolerance:  npo Voiding:  ok Patient reports pain as 8 on 0-10 scale.    Objective: Vital signs in last 24 hours: Temp:  [97.8 F (36.6 C)-98.3 F (36.8 C)] 97.9 F (36.6 C) (11/04 0532) Pulse Rate:  [81-100] 90  (11/04 0532) Resp:  [18-20] 20  (11/04 0532) BP: (122-151)/(50-65) 151/62 mmHg (11/04 0532) SpO2:  [96 %-100 %] 96 % (11/04 0532)  Labs:  [facility] 01/28/12 0545 01/27/12 0615 01/26/12 0550  HGB 8.8* 8.9* 8.9*    Basename 01/28/12 0545 01/27/12 0615  WBC 8.2 7.0  RBC 3.40* 3.47*  HCT 30.2* 31.3*  PLT 97* 108*    Basename 01/28/12 0545 01/27/12 0615  NA 147* 144  K 3.1* 3.3*  CL 107 103  CO2 34* 35*  BUN 20 21  CREATININE 1.11* 1.18*  GLUCOSE 72 80  CALCIUM 8.4 8.4    Basename 01/28/12 0545  LABPT --  INR 1.37    Physical Exam:  Neurologically intact ABD soft Neurovascular intact Sensation intact distally Intact pulses distally Dorsiflexion/Plantar flexion intact No cellulitis present  Assessment/Plan: T 12 comp fx Due for MRI  Today with possible t 12 kyphoplasty. If / when pain improves most likely will go to SNF         Virgal Warmuth R 01/28/2012, 9:20 AM

## 2012-01-28 NOTE — Progress Notes (Signed)
Family at bedside- they are agreeable to SNF at [facility] (tentatively tomorrow) for rehab. Plan is for kyphoplasty today- updated SNF and [employer] Rep and all is in place for d/c tomorrow if stable- patient pleased and agreeable to plans- optimistic for positive results after back procedure. Reece Levy, MSW, Theresia Majors [phone]

## 2012-01-29 DIAGNOSIS — R609 Edema, unspecified: Secondary | ICD-10-CM

## 2012-01-29 LAB — PROTIME-INR
INR: 1.29 (ref 0.00–1.49)
Prothrombin Time: 15.8 seconds — ABNORMAL HIGH (ref 11.6–15.2)

## 2012-01-29 LAB — GLUCOSE, CAPILLARY
Glucose-Capillary: 102 mg/dL — ABNORMAL HIGH (ref 70–99)
Glucose-Capillary: 119 mg/dL — ABNORMAL HIGH (ref 70–99)
Glucose-Capillary: 152 mg/dL — ABNORMAL HIGH (ref 70–99)

## 2012-01-29 LAB — CBC
Hemoglobin: 9.2 g/dL — ABNORMAL LOW (ref 12.0–15.0)
MCHC: 29.6 g/dL — ABNORMAL LOW (ref 30.0–36.0)
Platelets: 90 10*3/uL — ABNORMAL LOW (ref 150–400)
RBC: 3.51 MIL/uL — ABNORMAL LOW (ref 3.87–5.11)

## 2012-01-29 LAB — URINE CULTURE

## 2012-01-29 LAB — BASIC METABOLIC PANEL
BUN: 20 mg/dL (ref 6–23)
Calcium: 8.5 mg/dL (ref 8.4–10.5)
GFR calc Af Amer: 54 mL/min — ABNORMAL LOW (ref >90)
GFR calc non Af Amer: 46 mL/min — ABNORMAL LOW (ref >90)
Potassium: 3.1 mEq/L — ABNORMAL LOW (ref 3.5–5.1)

## 2012-01-29 MED ORDER — KETOROLAC TROMETHAMINE 30 MG/ML IJ SOLN
INTRAMUSCULAR | Status: AC
  Administered 2012-01-29: 15 mg
  Filled 2012-01-29: qty 1

## 2012-01-29 MED ORDER — POTASSIUM CHLORIDE CRYS ER 20 MEQ PO TBCR
40.0000 meq | EXTENDED_RELEASE_TABLET | Freq: Once | ORAL | Status: AC
  Administered 2012-01-29: 40 meq via ORAL
  Filled 2012-01-29 (×2): qty 2

## 2012-01-29 NOTE — Progress Notes (Signed)
PT Cancellation Note  Patient Details Name: SALLE BRANDLE MRN: [id number] DOB: [birth year]   Cancelled Treatment:    Reason Eval/Treat Not Completed:  (Pt on bedrest until pt undergoes kyphoplasty per Lindwood Qua, PA. Hopefully pt to go for procedure today.   Marcene Brawn 01/29/2012, 12:37 PM  Lewis Shock, PT, DPT Pager #: [phone] Office #: [phone]

## 2012-01-29 NOTE — Progress Notes (Signed)
Attempted to see pt today.  Pt on bedrest awaiting kyphoplasty hopefully today pending insurance approval.  Will check back 11/6.   Tory Emerald, [place] [phone]

## 2012-01-29 NOTE — Progress Notes (Signed)
Patient ID: Shannon Charles, female   DOB: [birth year], 78 y.o.   MRN: [id number]   Pt awaiting T12 VP/KP Unfortunately, we are still waiting for Insurance precertification  Will resume her diet now Will call RN when we hear from insurance  Will not hold NPO til we have news we can move forward Radiology will continue to monitor for news from [employer]

## 2012-01-29 NOTE — Progress Notes (Signed)
Subjective:      Activity level:  On hold for kyphoplasty due to insurance Diet tolerance:  NPO Voiding:  ok Patient reports pain as 5 on 0-10 scale.    Objective: Vital signs in last 24 hours: Temp:  [98 F (36.7 C)-98.8 F (37.1 C)] 98 F (36.7 C) (11/05 0507) Pulse Rate:  [80-93] 91  (11/05 0507) Resp:  [18-22] 22  (11/05 0507) BP: (123-149)/(55-65) 149/65 mmHg (11/05 0507) SpO2:  [93 %-98 %] 93 % (11/05 0507)  Labs:  [facility] 01/29/12 1610 01/28/12 0545 01/27/12 0615  HGB 9.2* 8.8* 8.9*    Basename 01/29/12 0637 01/28/12 0545  WBC 10.3 8.2  RBC 3.51* 3.40*  HCT 31.1* 30.2*  PLT 90* 97*    Basename 01/29/12 0637 01/28/12 0545  NA 145 147*  K 3.1* 3.1*  CL 106 107  CO2 31 34*  BUN 20 20  CREATININE 1.08 1.11*  GLUCOSE 116* 72  CALCIUM 8.5 8.4    Basename 01/29/12 0637 01/28/12 0545  LABPT -- --  INR 1.29 1.37    Physical Exam:  Neurologically intact Neurovascular intact Sensation intact distally Intact pulses distally Dorsiflexion/Plantar flexion intact  Assessment/Plan:      Hopefully kyphoplasty will work out today .  When pain is better --SNF      Raliegh Scobie R 01/29/2012, 10:14 AM

## 2012-01-29 NOTE — Progress Notes (Signed)
ANTIBIOTIC CONSULT NOTE - FOLLOW UP  Pharmacy Consult for Levaquin Indication: Ecoli UTI  No Known Allergies  Patient Measurements: Height: 5\' 2"  (157.5 cm) Weight: 205 lb (92.987 kg) IBW/kg (Calculated) : 50.1  Adjusted Body Weight:   Vital Signs: Temp: 98 F (36.7 C) (11/05 0507) Temp src: Oral (11/05 0507) BP: 149/65 mmHg (11/05 0507) Pulse Rate: 91  (11/05 0507) Intake/Output from previous day: 11/04 0701 - 11/05 0700 In: 600 [P.O.:600] Out: 200 [Urine:200] Intake/Output from this shift:    Labs:  [facility] 01/29/12 0637 01/28/12 0545 01/27/12 0615  WBC 10.3 8.2 7.0  HGB 9.2* 8.8* 8.9*  PLT 90* 97* 108*  LABCREA -- -- --  CREATININE 1.08 1.11* 1.18*   Estimated Creatinine Clearance: 42.7 ml/min (by C-G formula based on Cr of 1.08). No results found for this basename: VANCOTROUGH:2,VANCOPEAK:2,VANCORANDOM:2,GENTTROUGH:2,GENTPEAK:2,GENTRANDOM:2,TOBRATROUGH:2,TOBRAPEAK:2,TOBRARND:2,AMIKACINPEAK:2,AMIKACINTROU:2,AMIKACIN:2, in the last 72 hours   Microbiology: Recent Results (from the past 720 hour(s))  URINE CULTURE     Status: Normal   Collection Time   01/22/12  4:32 PM      Component Value Range Status Comment   Specimen Description URINE, CATHETERIZED   Final    Special Requests NONE   Final    Culture  Setup Time 01/22/2012 19:13   Final    Colony Count >=100,000 COLONIES/ML   Final    Culture ESCHERICHIA COLI   Final    Report Status 01/24/2012 FINAL   Final    Organism ID, Bacteria ESCHERICHIA COLI   Final   URINE CULTURE     Status: Normal   Collection Time   01/28/12  2:00 AM      Component Value Range Status Comment   Specimen Description URINE, CLEAN CATCH   Final    Special Requests levaquin, rifaximin Normal   Final    Culture  Setup Time 01/28/2012 03:05   Final    Colony Count NO GROWTH   Final    Culture NO GROWTH   Final    Report Status 01/29/2012 FINAL   Final   SURGICAL PCR SCREEN     Status: Normal   Collection Time   01/28/12  5:23 AM       Component Value Range Status Comment   MRSA, PCR NEGATIVE  NEGATIVE Final    Staphylococcus aureus NEGATIVE  NEGATIVE Final     Anti-infectives     Start     Dose/Rate Route Frequency Ordered Stop   01/26/12 1015   rifaximin (XIFAXAN) tablet 200 mg        200 mg Oral 3 times daily 01/26/12 1009     01/26/12 1000   levofloxacin (LEVAQUIN) tablet 250 mg        250 mg Oral Daily 01/26/12 0730     01/24/12 0900   piperacillin-tazobactam (ZOSYN) IVPB 3.375 g  Status:  Discontinued        3.375 g 12.5 mL/hr over 240 Minutes Intravenous Every 8 hours 01/24/12 0731 01/26/12 0720   01/24/12 0830   vancomycin (VANCOCIN) 1,250 mg in sodium chloride 0.9 % 250 mL IVPB  Status:  Discontinued        1,250 mg 166.7 mL/hr over 90 Minutes Intravenous Every 24 hours 01/24/12 0731 01/26/12 0720   01/22/12 2200   rifaximin (XIFAXAN) tablet 200 mg  Status:  Discontinued        200 mg Oral 3 times daily 01/22/12 1834 01/24/12 0727   01/22/12 2200   cefTRIAXone (ROCEPHIN) 1 g in dextrose 5 % 50  mL IVPB  Status:  Discontinued        1 g 100 mL/hr over 30 Minutes Intravenous Every 24 hours 01/22/12 2136 01/24/12 0727          Assessment: 84yof on Levaquin Day 4 (total antibiotics Day 6) for Ecoli UTI. Patient has remained afebrile, WBC wnl and repeat urine culture reported no growth. Patient's renal function has continued to improve (Crcl 43 ml/min). Noted plans to limit total antibiotic duration to 7-10 days.  Plan:  1. Continue Levaquin 250mg  PO daily - appropriate for renal function 2. Pharmacy will sign off. Please reconsult if additional assistance is needed.   Cleon Dew [phone] 01/29/2012,10:03 AM

## 2012-01-29 NOTE — Progress Notes (Signed)
Follow up call made again to IR to find out if they have heard anything from the insurance company. Was told still waiting on them and i will be contacted if they hear anything. Will continue to monitor patient for hypoglycemic episode since she is a diabetic.

## 2012-01-29 NOTE — Progress Notes (Signed)
[facility] PROGRESS NOTE  Shannon Charles WUJ:[id number] DOB: 09/20/1927 DOA: 01/22/2012 PCP: Romero Belling, MD  Brief HPI: Patient is an 78 y/o with h/o NASH with hepatic encephalopathy, dementia, chronic pain, hypothyroidism, HTN, DM, CKD, that presented to the hospital after developing weakness and sudden onset facial droop.  Reportedly patient fell while trying to go to the bathroom and at that time husband reported that patient had slurring of speech and R facial droop.  She was taken to the hospital for further work up.  While in house work up for stroke was negative.  Patient symptoms completely resolved and patient continued to complain of back discomfort.  X ray of back was obtained which showed a new compression fracture at T 12.  Orthopaedic surgeon was consulted and patient is underwaiting insurance clearance prior to planned Kyphoplasty  Assessment/Plan: TIA given negative work up (CT/MRI) for stroke - Continue to monitor on telemetry.  No red flags reported. - CT/MRI: Without acute abnormality - Carotid duplex shows no evidence of significant internal carotid artery stenosis - ECHO shows EF of 65-70% with grade 1 diastolic dysfunction - A1c is 6.4 - Lipid panel reviewed. - Hold ASA given GIB  - PT evaluation reviewed. No equipment recommendations. Recommended SNF on d/c - SNF once cleared from Orthopaedic surgeon standpoint.  Already discussed with Social worker and SNF will be at [facility].  Symptomatic anemia: Likely due to GI blood loss with upper more likely given melena. DDx includes gastritis, PUD, AVM, portal hypertensive gastropathy  -  Resolved after Tx 2 units PRBC  Hemoglobin after transfusion 8.6 and has been steady at this juncture with last hgb up from 8.8 to 9.2. - Will need GI follow up as outpatient. Plan on calling GI and helping patient set up appointment once she transitions out of the hospital. - hemoglobin trending up.  Tachycardia and tachypnea: Most  likely due to pain from back and UTI.  No evidence of wheeze or rales on exam and CXR negative for pneumonia. Restrictive lung disease likely contributing.  - CE x 3 negative - TSH WNL's at this time. 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 Ammonia level improved from prior  - Continue rifaximin  - Continue lasix  - Restart lower dose lactulose  - Abd [place] ordered initially which showed small to moderate volume of scattered abd. ascities  UTI: Positive nitrites  - U/C resulted  > 100,000 cfu of E coli, antibiotics have been adjusted accordingly. - Repeating U/A today for clearance.  Patient has been on antibiotics for the last 7 days.  Would shoot for a prolonged course of antibiotic therapy favoring 10 days of total treatment.  DM - Continue current regimen - stable currently. - blood sugars likely exacerbated initially due to infection.  HTN/HLD. Blood pressure stable to elevated  - Continue statin  - Patient' BP elevated today will start patient on metoprolol tartrate low dose 01/24/12  CKD with mild AKI:  - Improved with IVF's and seems to be at base line at this juncture.  Will administer IVF's and reevaluate.  Chronic pain, currently uncontrolled:  -Continue elavil, oxycodone prn   Dementia, exacerbated by acute infection, possible TIA:  -Continue memantine and donepezil   Depression/anxiety:  -Stable, continue zoloft   Hypothyroidism:  -Stable, continue synthroid.    T12 compression fracture - Consulted Orthopaedic surgeon  01/26/12 for further evaluation and recommendations.   - Patient will be  evaluated further for possible kyphoplasty at T 12.  Have discussed with husband. - I would like to thank ortho for their input and help in this case. - 01/29/12 Patient waiting to get clearance for kyphoplasty procedure   Code Status: DNR Family Communication: Husband at bedside 01/28/12.  Have  indicated to husband to update grand daughter Delaney Meigs at [phone] Disposition Plan:   To SNF [facility] once bed available and cleared by ortho (from their standpoint after planned procedure)  Consultants:  none  Procedures:  MRI, US abdomen, chest x ray, CT head  Antibiotics:  Continue current antibiotic regimen.  HPI/Subjective No new complaints today.  Still having back discomfort. No acute issues reported overnight.  Objective: Filed Vitals:   01/29/12 0150 01/29/12 0507 01/29/12 1048 01/29/12 1406  BP: 131/55 149/65 130/61 132/61  Pulse: 81 91 93 84  Temp: 98.2 F (36.8 C) 98 F (36.7 C) 98 F (36.7 C) 98 F (36.7 C)  TempSrc: Oral Oral Oral Oral  Resp: 20 22 20 18   Height:      Weight:      SpO2: 96% 93% 94% 97%    Intake/Output Summary (Last 24 hours) at 01/29/12 1746 Last data filed at 01/28/12 1830  Gross per 24 hour  Intake    240 ml  Output      0 ml  Net    240 ml   Filed Weights   01/22/12 1432  Weight: 92.987 kg (205 lb)    Exam:   General:  Pt in NAD, Alert and Awake  Cardiovascular: RRR, No MRG  Respiratory: Breath sounds BL, no wheezes  Abdomen: soft, NT  Data Reviewed: Basic Metabolic Panel:  Lab 01/29/12 7846 01/28/12 0545 01/27/12 0615 01/26/12 0550 01/25/12 1255 01/25/12 0630  NA 145 147* 144 151* -- 144  K 3.1* 3.1* 3.3* 3.5 -- 3.3*  CL 106 107 103 108 -- 103  CO2 31 34* 35* 35* -- 35*  GLUCOSE 116* 72 80 89 -- 75  BUN 20 20 21 19  -- 18  CREATININE 1.08 1.11* 1.18* 1.20* -- 1.22*  CALCIUM 8.5 8.4 8.4 8.3* -- 8.5  MG -- -- -- -- 2.1 --  PHOS -- -- -- -- 3.8 --   Liver Function Tests: No results found for this basename: AST:5,ALT:5,ALKPHOS:5,BILITOT:5,PROT:5,ALBUMIN:5 in the last 168 hours No results found for this basename: LIPASE:5,AMYLASE:5 in the last 168 hours No results found for this basename: AMMONIA:5 in the last 168 hours CBC:  Lab 01/29/12 0637 01/28/12 0545 01/27/12 0615 01/26/12 0550 01/25/12 0630  WBC  10.3 8.2 7.0 9.5 11.2*  NEUTROABS -- -- -- -- --  HGB 9.2* 8.8* 8.9* 8.9* 8.8*  HCT 31.1* 30.2* 31.3* 30.2* 29.1*  MCV 88.6 88.8 90.2 89.1 87.7  PLT 90* 97* 108* 113* 113*   Cardiac Enzymes:  Lab 01/23/12 0600 01/23/12 0025 01/22/12 1931  CKTOTAL -- -- --  CKMB -- -- --  CKMBINDEX -- -- --  TROPONINI <0.30 <0.30 <0.30   BNP (last 3 results)  Basename 12/05/11 1354 11/07/11 1531 07/02/11 1517  PROBNP 132.0* 184.7 52.0   CBG:  Lab 01/29/12 1659 01/29/12 1157 01/29/12 0704 01/28/12 2232 01/28/12 1642  GLUCAP 119* 102* 115* 161* 94    Recent Results (from the past 240 hour(s))  URINE CULTURE     Status: Normal   Collection Time   01/22/12  4:32 PM      Component Value Range Status Comment   Specimen Description URINE, CATHETERIZED  Final    Special Requests NONE   Final    Culture  Setup Time 01/22/2012 19:13   Final    Colony Count >=100,000 COLONIES/ML   Final    Culture ESCHERICHIA COLI   Final    Report Status 01/24/2012 FINAL   Final    Organism ID, Bacteria ESCHERICHIA COLI   Final   URINE CULTURE     Status: Normal   Collection Time   01/28/12  2:00 AM      Component Value Range Status Comment   Specimen Description URINE, CLEAN CATCH   Final    Special Requests levaquin, rifaximin Normal   Final    Culture  Setup Time 01/28/2012 03:05   Final    Colony Count NO GROWTH   Final    Culture NO GROWTH   Final    Report Status 01/29/2012 FINAL   Final   SURGICAL PCR SCREEN     Status: Normal   Collection Time   01/28/12  5:23 AM      Component Value Range Status Comment   MRSA, PCR NEGATIVE  NEGATIVE Final    Staphylococcus aureus NEGATIVE  NEGATIVE Final      Studies: No results found.  Scheduled Meds:    . amitriptyline  10 mg Oral QHS  . dextrose  1 ampule Intravenous Once  . donepezil  10 mg Oral QHS  . furosemide  40 mg Oral TID  . insulin aspart  0-9 Units Subcutaneous TID WC  . insulin glargine  35 Units Subcutaneous Daily  . [COMPLETED]  ketorolac      . lactulose  20 g Oral Daily  . latanoprost  1 drop Both Eyes QHS  . levofloxacin  250 mg Oral Daily  . levothyroxine  175 mcg Oral QAC breakfast  . memantine  10 mg Oral BID  . metoprolol tartrate  12.5 mg Oral BID  . [COMPLETED] potassium chloride  40 mEq Oral Once  . rifaximin  200 mg Oral TID  . sertraline  50 mg Oral BID  . simvastatin  40 mg Oral QPM   Continuous Infusions:    Principal Problem:  *T12 compression fracture Active Problems:  HYPOTHYROIDISM  HYPERLIPIDEMIA  DEMENTIA  HYPERTENSION  BACK PAIN, LUMBAR  Bell's palsy  Hepatic encephalopathy  Hypokalemia  Diabetes mellitus  Cirrhosis  Pulmonary edema  Anasarca  Lung disease, restrictive  TIA (transient ischemic attack)  Anemia, blood loss    Time spent: > 35 minutes    Penny Pia  [facility] Pager [phone]. If 8PM-8AM, please contact night-coverage at [email or web address], password [facility] 01/29/2012, 5:46 PM  LOS: 7 days

## 2012-01-30 ENCOUNTER — Telehealth (HOSPITAL_COMMUNITY): Payer: Self-pay | Admitting: Interventional Radiology

## 2012-01-30 LAB — GLUCOSE, CAPILLARY: Glucose-Capillary: 130 mg/dL — ABNORMAL HIGH (ref 70–99)

## 2012-01-30 MED ORDER — POTASSIUM CHLORIDE CRYS ER 20 MEQ PO TBCR
40.0000 meq | EXTENDED_RELEASE_TABLET | Freq: Once | ORAL | Status: AC
  Administered 2012-01-30: 40 meq via ORAL
  Filled 2012-01-30 (×2): qty 2

## 2012-01-30 MED ORDER — OXYCODONE HCL ER 10 MG PO T12A
10.0000 mg | EXTENDED_RELEASE_TABLET | Freq: Two times a day (BID) | ORAL | Status: DC
  Administered 2012-01-30 – 2012-02-01 (×4): 10 mg via ORAL
  Filled 2012-01-30 (×4): qty 1

## 2012-01-30 NOTE — Clinical Social Work Note (Signed)
Clinical Social Work  CSW updated Social research officer, government on discharge status. Bed is still available for when pt is ready for discharge. Blue Medicare Berkley Harvey has been obtained for discharge and will be updated.   CSW met with pt and family to update pt. Pt and family were thankful that bed at [facility] is still available. CSW will continue to follow.   Dede Query, MSW, Theresia Majors [phone]

## 2012-01-30 NOTE — Progress Notes (Signed)
Patient ID: Shannon Charles, female   DOB: 1927-06-09, 78 y.o.   MRN: [id number]    Unfortunately, Insurance has denied KP procedure. Requirements apparently need to be 2-4 weeks of medical treatment before will approve procedure  RN aware and will inform MD If pt continues with pain or worsens Please call scheduler at [phone] We can scheduled procedure as OP if procedure necessary

## 2012-01-30 NOTE — Progress Notes (Signed)
[facility] PROGRESS NOTE  Shannon Charles VOZ:[id number] DOB: 1927-11-08 DOA: 01/22/2012 PCP: Romero Belling, MD  Brief HPI: Patient is an 78 y/o with h/o NASH with hepatic encephalopathy, dementia, chronic pain, hypothyroidism, HTN, DM, CKD, that presented to the hospital after developing weakness and sudden onset facial droop.  Reportedly patient fell while trying to go to the bathroom and at that time husband reported that patient had slurring of speech and R facial droop.  She was taken to the hospital for further work up.  While in house work up for stroke was negative.  Patient symptoms completely resolved and patient continued to complain of back discomfort.  X ray of back was obtained which showed a new compression fracture at T 12.  Orthopaedic surgeon was consulted and patient is underwaiting insurance clearance prior to planned Kyphoplasty  Assessment/Plan: TIA given negative work up (CT/MRI) for stroke - Continue to monitor on telemetry.  No red flags reported. - CT/MRI: Without acute abnormality - Carotid duplex shows no evidence of significant internal carotid artery stenosis - ECHO shows EF of 65-70% with grade 1 diastolic dysfunction - A1c is 6.4 - Lipid panel reviewed. - Hold ASA given GIB  - PT evaluation reviewed. No equipment recommendations. Recommended SNF on d/c - SNF once cleared from Orthopaedic surgeon standpoint.  Already discussed with Social worker and SNF will be at [place].  Symptomatic anemia:  Likely due to GI blood loss with upper more likely given melena. DDx includes gastritis, PUD, AVM, portal hypertensive gastropathy  -  Resolved after Tx 2 units PRBC  Hemoglobin after transfusion 8.6 and has been steady at this juncture with last hgb up from 8.8 to 9.2. - Will need GI follow up as outpatient. Plan on calling GI and helping patient set up appointment once she transitions out of the hospital. - hemoglobin trending up.  Tachycardia and tachypnea:    Most likely due to pain from back and UTI.  No evidence of wheeze or rales on exam and CXR negative for pneumonia. Restrictive lung disease likely contributing.  - CE x 3 negative - TSH WNL's at this time. 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 Ammonia level improved from prior  - Continue rifaximin  - Continue lasix  - Restart lower dose lactulose  - Abd [place] ordered initially which showed small to moderate volume of scattered abd. ascities  UTI: Positive nitrites  - U/C resulted  > 100,000 cfu of E coli, antibiotics have been adjusted accordingly. - Repeating U/A today for clearance.  Patient has been on antibiotics for the last 7 days.   Would shoot for a prolonged course of antibiotic therapy favoring 10 days of total treatment.  DM - Continue current regimen - stable currently. - blood sugars likely exacerbated initially due to infection.  HTN/HLD.  Blood pressure stable to elevated  - Continue statin  - Patient' BP elevated today will start patient on metoprolol tartrate low dose 01/24/12  CKD with mild AKI:  - Improved with IVF's and seems to be at base line at this juncture.  Will administer IVF's and reevaluate.  Chronic pain, currently uncontrolled:  Will start her on oxycontin 10 mg po q 12 hr  Dementia, exacerbated by acute infection, possible TIA:  -Continue memantine and donepezil   Depression/anxiety:  -Stable, continue zoloft   Hypothyroidism:  -Stable, continue synthroid.    T12 compression fracture - Consulted Orthopaedic surgeon  01/26/12  for further evaluation and recommendations.   - Patient did not get clearance from insurance for the kyphoplasty.   Code Status: DNR Family Communication: Husband at bedside 01/28/12.  Have indicated to husband to update grand daughter Shannon Charles at [phone] Disposition Plan:   To SNF at [facility] once bed available    Consultants:  none  Procedures:  MRI, US abdomen, chest x ray, CT head  Antibiotics:  Continue current antibiotic regimen.  HPI/Subjective No new complaints today.  Still having back discomfort. Says it is 8/10 in intensity.  Objective: Filed Vitals:   01/29/12 2101 01/29/12 2200 01/30/12 0159 01/30/12 0600  BP: 130/61 130/61 144/61 135/62  Pulse: 88 88 85 88  Temp:  98.3 F (36.8 C) 98.1 F (36.7 C) 98.1 F (36.7 C)  TempSrc:      Resp:  22 22 22   Height:      Weight:      SpO2:  94% 100% 96%    Intake/Output Summary (Last 24 hours) at 01/30/12 1758 Last data filed at 01/30/12 0526  Gross per 24 hour  Intake      0 ml  Output    650 ml  Net   -650 ml   Filed Weights   01/22/12 1432  Weight: 92.987 kg (205 lb)    Exam:   General:  Pt in NAD, Alert and Awake  Cardiovascular: RRR, No MRG  Respiratory: Breath sounds BL, no wheezes  Abdomen: soft, NT  Data Reviewed: Basic Metabolic Panel:  Lab 01/30/12 4540 01/29/12 0637 01/28/12 0545 01/27/12 0615 01/26/12 0550 01/25/12 1255 01/25/12 0630  NA -- 145 147* 144 151* -- 144  K 3.3* 3.1* 3.1* 3.3* 3.5 -- --  CL -- 106 107 103 108 -- 103  CO2 -- 31 34* 35* 35* -- 35*  GLUCOSE -- 116* 72 80 89 -- 75  BUN -- 20 20 21 19  -- 18  CREATININE -- 1.08 1.11* 1.18* 1.20* -- 1.22*  CALCIUM -- 8.5 8.4 8.4 8.3* -- 8.5  MG -- -- -- -- -- 2.1 --  PHOS -- -- -- -- -- 3.8 --   Liver Function Tests: No results found for this basename: AST:5,ALT:5,ALKPHOS:5,BILITOT:5,PROT:5,ALBUMIN:5 in the last 168 hours No results found for this basename: LIPASE:5,AMYLASE:5 in the last 168 hours No results found for this basename: AMMONIA:5 in the last 168 hours CBC:  Lab 01/29/12 0637 01/28/12 0545 01/27/12 0615 01/26/12 0550 01/25/12 0630  WBC 10.3 8.2 7.0 9.5 11.2*  NEUTROABS -- -- -- -- --  HGB 9.2* 8.8* 8.9* 8.9* 8.8*  HCT 31.1* 30.2* 31.3* 30.2* 29.1*  MCV 88.6 88.8 90.2 89.1 87.7  PLT 90* 97* 108* 113* 113*    Cardiac Enzymes: No results found for this basename: CKTOTAL:5,CKMB:5,CKMBINDEX:5,TROPONINI:5 in the last 168 hours BNP (last 3 results)  Basename 12/05/11 1354 11/07/11 1531 07/02/11 1517  PROBNP 132.0* 184.7 52.0   CBG:  Lab 01/30/12 1614 01/30/12 1128 01/30/12 0646 01/29/12 2159 01/29/12 1659  GLUCAP 107* 146* 130* 152* 119*    Recent Results (from the past 240 hour(s))  URINE CULTURE     Status: Normal   Collection Time   01/22/12  4:32 PM      Component Value Range Status Comment   Specimen Description URINE, CATHETERIZED   Final    Special Requests NONE   Final    Culture  Setup Time 01/22/2012 19:13   Final    Colony Count >=100,000 COLONIES/ML   Final    Culture ESCHERICHIA  COLI   Final    Report Status 01/24/2012 FINAL   Final    Organism ID, Bacteria ESCHERICHIA COLI   Final   URINE CULTURE     Status: Normal   Collection Time   01/28/12  2:00 AM      Component Value Range Status Comment   Specimen Description URINE, CLEAN CATCH   Final    Special Requests levaquin, rifaximin Normal   Final    Culture  Setup Time 01/28/2012 03:05   Final    Colony Count NO GROWTH   Final    Culture NO GROWTH   Final    Report Status 01/29/2012 FINAL   Final   SURGICAL PCR SCREEN     Status: Normal   Collection Time   01/28/12  5:23 AM      Component Value Range Status Comment   MRSA, PCR NEGATIVE  NEGATIVE Final    Staphylococcus aureus NEGATIVE  NEGATIVE Final      Studies: No results found.  Scheduled Meds:    . amitriptyline  10 mg Oral QHS  . dextrose  1 ampule Intravenous Once  . donepezil  10 mg Oral QHS  . furosemide  40 mg Oral TID  . insulin aspart  0-9 Units Subcutaneous TID WC  . insulin glargine  35 Units Subcutaneous Daily  . lactulose  20 g Oral Daily  . latanoprost  1 drop Both Eyes QHS  . levofloxacin  250 mg Oral Daily  . levothyroxine  175 mcg Oral QAC breakfast  . memantine  10 mg Oral BID  . metoprolol tartrate  12.5 mg Oral BID  . OxyCODONE   10 mg Oral Q12H  . potassium chloride  40 mEq Oral Once  . rifaximin  200 mg Oral TID  . sertraline  50 mg Oral BID  . simvastatin  40 mg Oral QPM   Continuous Infusions:    Principal Problem:  *T12 compression fracture Active Problems:  HYPOTHYROIDISM  HYPERLIPIDEMIA  DEMENTIA  HYPERTENSION  BACK PAIN, LUMBAR  Bell's palsy  Hepatic encephalopathy  Hypokalemia  Diabetes mellitus  Cirrhosis  Pulmonary edema  Anasarca  Lung disease, restrictive  TIA (transient ischemic attack)  Anemia, blood loss    Time spent: > 35 minutes    [facility]  [facility] Pager [phone]. If 8PM-8AM, please contact night-coverage at [email or web address], password [facility] 01/30/2012, 5:58 PM  LOS: 8 days

## 2012-01-30 NOTE — Progress Notes (Signed)
PT Cancellation Note (late entry for a.m.)  Patient Details Name: Shannon Charles MRN: [id number] DOB: November 13, 1927   Cancelled Treatment:   A.m. Pt with continued pain prohibiting OOB. **Noted as of this p.m. pt has been denied by insurance for kyphoplasty. Will coordinate attempt at OOB with pain medicine schedule on 01/31/12     [name] 01/30/2012, 4:58 PM Pager [phone]

## 2012-01-30 NOTE — Progress Notes (Signed)
OT Cancellation Note  Patient Details Name: Shannon Charles MRN: [id number] DOB: 1928-03-24   Cancelled Treatment:    Reason Eval/Treat Not Completed: Pain limiting ability to participate. Per PT order, pt may ambulate and sit in chair if comfortable.  However, pt in significant pain this morning per RN report.  Awaiting kyphoplasty pending insurance approval.  Will continue to follow.  01/30/2012 Cipriano Mile OTR/L Pager [phone] Office [phone]

## 2012-01-31 LAB — GLUCOSE, CAPILLARY
Glucose-Capillary: 115 mg/dL — ABNORMAL HIGH (ref 70–99)
Glucose-Capillary: 157 mg/dL — ABNORMAL HIGH (ref 70–99)
Glucose-Capillary: 140 mg/dL — ABNORMAL HIGH (ref 70–99)
Glucose-Capillary: 127 mg/dL — ABNORMAL HIGH (ref 70–99)

## 2012-01-31 LAB — CBC
MCH: 26.2 pg (ref 26.0–34.0)
MCHC: 28.8 g/dL — ABNORMAL LOW (ref 30.0–36.0)
MCV: 91.2 fL (ref 78.0–100.0)
Platelets: 108 10*3/uL — ABNORMAL LOW (ref 150–400)
RDW: 19.5 % — ABNORMAL HIGH (ref 11.5–15.5)

## 2012-01-31 LAB — BASIC METABOLIC PANEL
CO2: 35 mEq/L — ABNORMAL HIGH (ref 19–32)
Calcium: 8.9 mg/dL (ref 8.4–10.5)
Creatinine, Ser: 1.1 mg/dL (ref 0.50–1.10)
GFR calc non Af Amer: 45 mL/min — ABNORMAL LOW (ref >90)
Glucose, Bld: 163 mg/dL — ABNORMAL HIGH (ref 70–99)

## 2012-01-31 MED ORDER — SENNOSIDES-DOCUSATE SODIUM 8.6-50 MG PO TABS
1.0000 | ORAL_TABLET | Freq: Every day | ORAL | Status: DC
  Administered 2012-01-31: 1 via ORAL
  Filled 2012-01-31: qty 1

## 2012-01-31 NOTE — Progress Notes (Signed)
Orthopedic Tech Progress Note Patient Details:  Shannon Charles Jul 24, 1927 [id number]  Patient ID: Shannon Charles, female   DOB: [birth year], 78 y.o.   MRN: [id number]   [name] 01/31/2012, 5:17 PM T-12 COMPRESSION BRACE COMPLETED BY B[employer]

## 2012-01-31 NOTE — Evaluation (Signed)
Physical Therapy Re-Evaluation Patient Details Name: Shannon Charles MRN: [id number] DOB: [birth year] Today's Date: 01/31/2012 Time: 7253-6644 PT Time Calculation (min): 27 min  PT Assessment / Plan / Recommendation Clinical Impression  Pt admitted for possible TIA following fall. Pt found to have thoracic fx awaiting kyphoplasty, which was denied by insurance. Pt plan to discharge to SNF once pain under control. Order discontinued and then reordered to attempt mobility. Pt extremely limited by pain. Discussed possible use of brace to ease comfort with Dr. Sharl Ma and also contacted Dr. Yisroel Ramming in ortho and spoke with the secretary. Pt will benefit from skilled PT in the acute care setting in order to maximize functional mobility and safety as well as minimize atrophy from prolonged bed rest.     PT Assessment  Patient needs continued PT services    Follow Up Recommendations  SNF    Does the patient have the potential to tolerate intense rehabilitation   No, Recommend SNF  Barriers to Discharge        Equipment Recommendations  None recommended by OT    Recommendations for Other Services     Frequency Min 3X/week    Precautions / Restrictions Precautions Precautions: Back;Fall Precaution Comments: Educated on log roll technique to assist in minimizing pain during bed mobility.   Pertinent Vitals/Pain 8/10 pain throughout session. Pain meds given prior to session. RN aware      Mobility  Bed Mobility Bed Mobility: Rolling Right;Rolling Left;Left Sidelying to Sit;Sitting - Scoot to Edge of Bed;Sit to Sidelying Left Rolling Right: 4: Min assist Rolling Left: 4: Min assist Left Sidelying to Sit: 1: +2 Total assist;HOB flat;With rails Left Sidelying to Sit: Patient Percentage: 40% Sitting - Scoot to Edge of Bed: 3: Mod assist Sit to Sidelying Left: 1: +2 Total assist;HOB flat;With rail Sit to Sidelying Left: Patient Percentage: 30% Details for Bed Mobility Assistance: Verbal and  tactile cueing for log roll technique.  Limited by pain, Transfers Details for Transfer Assistance: pt refused this session Ambulation/Gait Ambulation/Gait Assistance: Not tested (comment)    Shoulder Instructions     Exercises General Exercises - Lower Extremity Gluteal Sets: AROM;Strengthening;Both;10 reps;Supine Heel Slides: AROM;Strengthening;Both;10 reps;Supine;Limitations Heel Slides Limitations: unable to achieve full range secondary to back pain Straight Leg Raises: AROM;Strengthening;Both;10 reps;Supine;Limitations Straight Leg Raises Limitations: unable to achieve full range secondary to back pain   PT Diagnosis: Difficulty walking;Generalized weakness;Acute pain  PT Problem List: Decreased strength;Decreased activity tolerance;Decreased balance;Decreased mobility;Decreased cognition;Decreased knowledge of use of DME;Decreased safety awareness;Obesity;Pain PT Treatment Interventions: DME instruction;Gait training;Functional mobility training;Therapeutic activities;Therapeutic exercise;Cognitive remediation;Patient/family education   PT Goals Acute Rehab PT Goals PT Goal Formulation: With patient/family Time For Goal Achievement: 02/14/12 Potential to Achieve Goals: Fair Pt will Roll Supine to Left Side: with supervision PT Goal: Rolling Supine to Left Side - Progress: Goal set today Pt will go Supine/Side to Sit: with supervision PT Goal: Supine/Side to Sit - Progress: Goal set today Pt will Sit at Edge of Bed: with supervision;3-5 min;with unilateral upper extremity support PT Goal: Sit at Edge Of Bed - Progress: Goal set today Pt will go Sit to Supine/Side: with supervision;with HOB 0 degrees PT Goal: Sit to Supine/Side - Progress: Goal set today Pt will go Sit to Stand: with min assist;with upper extremity assist PT Goal: Sit to Stand - Progress: Goal set today Pt will go Stand to Sit: with min assist;with upper extremity assist PT Goal: Stand to Sit - Progress: Goal  set today Pt will Ambulate: 16 -  50 feet;with min assist;with least restrictive assistive device PT Goal: Ambulate - Progress: Goal set today Pt will Perform Home Exercise Program: with supervision, verbal cues required/provided PT Goal: Perform Home Exercise Program - Progress: Goal set today  Visit Information  Last PT Received On: 01/31/12 Assistance Needed: +2 PT/OT Co-Evaluation/Treatment: Yes    Subjective Data  Patient Stated Goal: to be in less pain   Prior Functioning  Home Living Lives With: Spouse Available Help at Discharge: Family;Available 24 hours/day Type of Home: House Home Access: Ramped entrance Home Layout: One level Bathroom Shower/Tub: Other (comment) Bathroom Toilet: Standard Bathroom Accessibility: Yes How Accessible: Accessible via walker Home Adaptive Equipment: Bedside commode/3-in-1;Walker - four wheeled;Walker - rolling Prior Function Level of Independence: Needs assistance Needs Assistance: Bathing;Dressing;Toileting;Gait;Transfers Able to Take Stairs?: No Vocation: Retired    Probation officer Status: History of cognitive impairments - at baseline Arousal/Alertness: Awake/alert Behavior During Session: Restless Cognition - Other Comments: Often relies on husband to answer questions for her, such as her pain level.    Extremity/Trunk Assessment Right Lower Extremity Assessment RLE ROM/Strength/Tone: Deficits RLE ROM/Strength/Tone Deficits: Grossly 3+-4/5 Left Lower Extremity Assessment LLE ROM/Strength/Tone: Deficits LLE ROM/Strength/Tone Deficits: Grossly 3+-4/5   Balance Static Sitting Balance Static Sitting - Balance Support: Bilateral upper extremity supported;Feet supported Static Sitting - Level of Assistance: 5: Stand by assistance;3: Mod assist Static Sitting - Comment/# of Minutes: Pt initally able to sit EOB with stand by for ~5 min. Pt then began to fatigue and lean posteriorly on therapist for support to minimize pain  rather than for balance support.  End of Session PT - End of Session Activity Tolerance: Patient limited by pain Patient left: in bed;with call bell/phone within reach;with family/visitor present Nurse Communication: Mobility status  GP     Milana Kidney 01/31/2012, 10:31 AM

## 2012-01-31 NOTE — Progress Notes (Signed)
[facility] PROGRESS NOTE  Shannon Charles AVW:[id number] DOB: [birth year] DOA: 01/22/2012 PCP: Romero Belling, MD  Brief HPI: Patient is an 78 y/o with h/o NASH with hepatic encephalopathy, dementia, chronic pain, hypothyroidism, HTN, DM, CKD, that presented to the hospital after developing weakness and sudden onset facial droop.  Reportedly patient fell while trying to go to the bathroom and at that time husband reported that patient had slurring of speech and R facial droop.  She was taken to the hospital for further work up.  While in house work up for stroke was negative.  Patient symptoms completely resolved and patient continued to complain of back discomfort.  X ray of back was obtained which showed a new compression fracture at T 12.  Orthopaedic surgeon was consulted and patient is underwaiting insurance clearance prior to planned Kyphoplasty  Assessment/Plan: TIA given negative work up (CT/MRI) for stroke - Continue to monitor on telemetry.  No red flags reported. - CT/MRI: Without acute abnormality - Carotid duplex shows no evidence of significant internal carotid artery stenosis - ECHO shows EF of 65-70% with grade 1 diastolic dysfunction - A1c is 6.4 - Lipid panel reviewed. - Hold ASA given GIB  - PT evaluation reviewed. No equipment recommendations. Recommended SNF on d/c - SNF once cleared from Orthopaedic surgeon standpoint.  Already discussed with Social worker and SNF will be at [place].  Symptomatic anemia:  Likely due to GI blood loss with upper more likely given melena. DDx includes gastritis, PUD, AVM, portal hypertensive gastropathy  -  Resolved after Tx 2 units PRBC  Hemoglobin after transfusion 8.6 and has been steady at this juncture with last hgb up from 8.8 to 9.2. - Will need GI follow up as outpatient.  - Plan on calling GI and helping patient set up appointment once she transitions out of the hospital. - hemoglobin trending up.  Tachycardia and  tachypnea:  Most likely due to pain from back and UTI.  No evidence of wheeze or rales on exam and CXR negative for pneumonia. Restrictive lung disease likely contributing.  - CE x 3 negative - TSH WNL's at this time. 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 Ammonia level improved from prior  - Continue rifaximin  - Continue lasix  - Restart lower dose lactulose  - Abd [place] ordered initially which showed small to moderate volume of scattered abd. ascities  UTI: Positive nitrites  - U/C resulted  > 100,000 cfu of E coli, antibiotics have been adjusted accordingly. - Repeating U/A today for clearance.  Patient has been on antibiotics for the last 7 days.   Would shoot for a prolonged course of antibiotic therapy favoring 10 days of total treatment.  DM - Continue current regimen - stable currently. - blood sugars likely exacerbated initially due to infection.  HTN/HLD.  Blood pressure stable to elevated  - Continue statin  - Patient' BP elevated today will start patient on metoprolol tartrate low dose 01/24/12  CKD with mild AKI:  - Improved with IVF's and seems to be at base line at this juncture.  Will administer IVF's and reevaluate.  Chronic pain, currently uncontrolled:  Will start her on oxycontin 10 mg po q 12 hr  Dementia, exacerbated by acute infection, possible TIA:  -Continue memantine and donepezil   Depression/anxiety:  -Stable, continue zoloft   Hypothyroidism:  -Stable, continue synthroid.    T12 compression fracture - Consulted Orthopaedic surgeon  01/26/12 for further evaluation and recommendations.   - Patient did not get clearance from insurance for the kyphoplasty.   Code Status: DNR Family Communication: Husband at bedside 01/31/12.  Have indicated to husband to update grand daughter Delaney Meigs at [phone] Disposition Plan:   To SNF at [facility] once bed available    Consultants:  none  Procedures:  MRI, US abdomen, chest x ray, CT head  Antibiotics:  Continue current antibiotic regimen.  HPI/Subjective No new complaints today.  Still having back discomfort. Though she was able to sleep last night.  Objective: Filed Vitals:   01/30/12 2200 01/31/12 0200 01/31/12 0600 01/31/12 0603  BP: 153/61 147/65 130/50   Pulse: 82 82 85   Temp: 97.9 F (36.6 C) 98.6 F (37 C) 98.3 F (36.8 C)   TempSrc:      Resp: 20 20 20    Height:      Weight:    91.9 kg (202 lb 9.6 oz)  SpO2: 92% 96% 97%     Intake/Output Summary (Last 24 hours) at 01/31/12 1034 Last data filed at 01/31/12 0130  Gross per 24 hour  Intake      0 ml  Output    350 ml  Net   -350 ml   Filed Weights   01/22/12 1432 01/31/12 0603  Weight: 92.987 kg (205 lb) 91.9 kg (202 lb 9.6 oz)    Exam:   General:  Pt in NAD, Alert and Awake  Cardiovascular: RRR, No MRG  Respiratory: Breath sounds BL, no wheezes  Abdomen: Soft, Nontender  Data Reviewed: Basic Metabolic Panel:  Lab 01/30/12 4540 01/29/12 0637 01/28/12 0545 01/27/12 0615 01/26/12 0550 01/25/12 1255 01/25/12 0630  NA -- 145 147* 144 151* -- 144  K 3.3* 3.1* 3.1* 3.3* 3.5 -- --  CL -- 106 107 103 108 -- 103  CO2 -- 31 34* 35* 35* -- 35*  GLUCOSE -- 116* 72 80 89 -- 75  BUN -- 20 20 21 19  -- 18  CREATININE -- 1.08 1.11* 1.18* 1.20* -- 1.22*  CALCIUM -- 8.5 8.4 8.4 8.3* -- 8.5  MG -- -- -- -- -- 2.1 --  PHOS -- -- -- -- -- 3.8 --   CBC:  Lab 01/29/12 0637 01/28/12 0545 01/27/12 0615 01/26/12 0550 01/25/12 0630  WBC 10.3 8.2 7.0 9.5 11.2*  NEUTROABS -- -- -- -- --  HGB 9.2* 8.8* 8.9* 8.9* 8.8*  HCT 31.1* 30.2* 31.3* 30.2* 29.1*  MCV 88.6 88.8 90.2 89.1 87.7  PLT 90* 97* 108* 113* 113*   Cardiac Enzymes: No results found for this basename: CKTOTAL:5,CKMB:5,CKMBINDEX:5,TROPONINI:5 in the last 168 hours BNP (last 3 results)  Basename 12/05/11 1354 11/07/11 1531 07/02/11 1517  PROBNP 132.0* 184.7  52.0   CBG:  Lab 01/30/12 2136 01/30/12 1614 01/30/12 1128 01/30/12 0646 01/29/12 2159  GLUCAP 134* 107* 146* 130* 152*    Recent Results (from the past 240 hour(s))  URINE CULTURE     Status: Normal   Collection Time   01/22/12  4:32 PM      Component Value Range Status Comment   Specimen Description URINE, CATHETERIZED   Final    Special Requests NONE   Final    Culture  Setup Time 01/22/2012 19:13   Final    Colony Count >=100,000 COLONIES/ML   Final    Culture ESCHERICHIA COLI   Final    Report Status 01/24/2012 FINAL   Final    Organism ID, Bacteria ESCHERICHIA COLI  Final   URINE CULTURE     Status: Normal   Collection Time   01/28/12  2:00 AM      Component Value Range Status Comment   Specimen Description URINE, CLEAN CATCH   Final    Special Requests levaquin, rifaximin Normal   Final    Culture  Setup Time 01/28/2012 03:05   Final    Colony Count NO GROWTH   Final    Culture NO GROWTH   Final    Report Status 01/29/2012 FINAL   Final   SURGICAL PCR SCREEN     Status: Normal   Collection Time   01/28/12  5:23 AM      Component Value Range Status Comment   MRSA, PCR NEGATIVE  NEGATIVE Final    Staphylococcus aureus NEGATIVE  NEGATIVE Final      Studies: No results found.  Scheduled Meds:    . amitriptyline  10 mg Oral QHS  . dextrose  1 ampule Intravenous Once  . donepezil  10 mg Oral QHS  . furosemide  40 mg Oral TID  . insulin aspart  0-9 Units Subcutaneous TID WC  . insulin glargine  35 Units Subcutaneous Daily  . lactulose  20 g Oral Daily  . latanoprost  1 drop Both Eyes QHS  . levofloxacin  250 mg Oral Daily  . levothyroxine  175 mcg Oral QAC breakfast  . memantine  10 mg Oral BID  . metoprolol tartrate  12.5 mg Oral BID  . OxyCODONE  10 mg Oral Q12H  . [COMPLETED] potassium chloride  40 mEq Oral Once  . rifaximin  200 mg Oral TID  . senna-docusate  1 tablet Oral QHS  . sertraline  50 mg Oral BID  . simvastatin  40 mg Oral QPM   Continuous  Infusions:    Principal Problem:  *T12 compression fracture Active Problems:  HYPOTHYROIDISM  HYPERLIPIDEMIA  DEMENTIA  HYPERTENSION  BACK PAIN, LUMBAR  Bell's palsy  Hepatic encephalopathy  Hypokalemia  Diabetes mellitus  Cirrhosis  Pulmonary edema  Anasarca  Lung disease, restrictive  TIA (transient ischemic attack)  Anemia, blood loss    Time spent: > 35 minutes    [facility]  [facility] Pager [phone]. If 8PM-8AM, please contact night-coverage at [email or web address], password [facility] 01/31/2012, 10:34 AM  LOS: 9 days

## 2012-01-31 NOTE — Progress Notes (Signed)
Orthopedic Tech Progress Note Patient Details:  Shannon Charles 09-28-1927 [id number]  Patient ID: Shannon Charles, female   DOB: [birth year], 78 y.o.   MRN: [id number]   Shannon Charles 01/31/2012, 4:53 PM  CHRNAGHI ( PA) CALLED [employer] FOR  COMPRESSION FX BRACE

## 2012-01-31 NOTE — Progress Notes (Addendum)
Insurance company denied coverage for kyphoplasty. Recommend continuing physical therapy as tolerated. Patient can be out of bed sitting and ambulating if pain level is reasonable. Patient most likely will go to [facility] skilled nursing facility. We will sign off for now. I have ordered a comp fx brace from [employer] today

## 2012-01-31 NOTE — Progress Notes (Signed)
Patient ID: Shannon Charles, female   DOB: 22-Oct-1927, 78 y.o.   MRN: [id number]   Will cx IR request for KP in computer Ins has denied coverage  May re order in another few weeks and we can  Re apply for coverage at that time if want

## 2012-01-31 NOTE — Progress Notes (Signed)
Occupational Therapy Treatment Patient Details Name: Shannon Charles MRN: [id number] DOB: 1927-05-13 Today's Date: 01/31/2012 Time: 7829-5621 OT Time Calculation (min): 27 min  OT Assessment / Plan / Recommendation Comments on Treatment Session Pt making slow progress due to pain.  Insurance did not approve kyphoplasty.   Would however benefit from back brace (possibly TLSO?) to help minimize pain during mobility.  Pt planning to go to [facility] for d/c.    Follow Up Recommendations  SNF    Barriers to Discharge       Equipment Recommendations  None recommended by OT    Recommendations for Other Services    Frequency Min 2X/week   Plan Discharge plan remains appropriate    Precautions / Restrictions Precautions Precautions: Back;Fall Precaution Comments: Educated on log roll technique to assist in minimizing pain during bed mobility.   Pertinent Vitals/Pain See vitals    ADL  Grooming: Performed;Wash/dry face;Set up Where Assessed - Grooming: Supine, head of bed up Equipment Used:  (none) Transfers/Ambulation Related to ADLs: Pt refusing OOB transfer due to pain. ADL Comments: Pt limited by back pain.  Pt motivated to participate in therapy.  Performed bed mobility using log roll technique. Pt sat EOB ~7 min (static balance) but continued to experience pain.  Encouraged pt to perform sit<>stand with therapist but pt refused due to pain.  Once supine in bed, pt performed rolling L/R for use of bed pain.      OT Diagnosis:    OT Problem List:   OT Treatment Interventions:     OT Goals ADL Goals Pt Will Perform Grooming: with supervision;Sitting at sink;Standing at sink ADL Goal: Grooming - Progress: Progressing toward goals Miscellaneous OT Goals Miscellaneous OT Goal #1: Pt will perform bed mobility with supervision in prep for EOB ADLs. OT Goal: Miscellaneous Goal #1 - Progress: Progressing toward goals Miscellaneous OT Goal #2: Pt will tolerate basic sit<>stand transfer  with mod assist as precursor for toilet transfer. OT Goal: Miscellaneous Goal #2 - Progress: Goal set today  Visit Information  Last OT Received On: 01/31/12 Assistance Needed: +2    Subjective Data      Prior Functioning       Cognition  Overall Cognitive Status: History of cognitive impairments - at baseline Arousal/Alertness: Awake/alert Behavior During Session: Restless Cognition - Other Comments: Often relies on husband to answer questions for her, such as her pain level.    Mobility  Shoulder Instructions Bed Mobility Bed Mobility: Rolling Right;Rolling Left;Left Sidelying to Sit;Sitting - Scoot to Edge of Bed;Sit to Sidelying Left Rolling Right: 4: Min assist Rolling Left: 4: Min assist Left Sidelying to Sit: 1: +2 Total assist;HOB flat;With rails Left Sidelying to Sit: Patient Percentage: 40% Sitting - Scoot to Edge of Bed: 3: Mod assist Sit to Sidelying Left: 1: +2 Total assist;HOB flat;With rail Sit to Sidelying Left: Patient Percentage: 30% Details for Bed Mobility Assistance: Verbal and tactile cueing for log roll technique.  Limited by pain, Transfers Transfers: Not assessed       Exercises      Balance Static Sitting Balance Static Sitting - Balance Support: Bilateral upper extremity supported;Feet supported Static Sitting - Level of Assistance: 5: Stand by assistance;3: Mod assist Static Sitting - Comment/# of Minutes: Pt initally able to sit EOB with stand by for ~5 min. Pt then began to fatigue and lean posteriorly on therapist for support to minimize pain rather than for balance support.   End of Session OT - End of Session  Equipment Utilized During Treatment:  (none) Activity Tolerance: Patient limited by pain Patient left: in bed;with nursing in room;with family/visitor present Nurse Communication: Mobility status;Patient requests pain meds  GO   01/31/2012 Cipriano Mile OTR/L Pager [phone] Office [phone]   Cipriano Mile 01/31/2012, 10:27 AM

## 2012-02-01 LAB — GLUCOSE, CAPILLARY
Glucose-Capillary: 81 mg/dL (ref 70–99)
Glucose-Capillary: 117 mg/dL — ABNORMAL HIGH (ref 70–99)

## 2012-02-01 MED ORDER — ASPIRIN 81 MG PO TBEC: 81.0000 mg | DELAYED_RELEASE_TABLET | Freq: Every day | ORAL | Status: DC

## 2012-02-01 MED ORDER — INSULIN GLARGINE 100 UNIT/ML ~~LOC~~ SOLN: 35.0000 [IU] | Freq: Every day | SUBCUTANEOUS | Status: AC

## 2012-02-01 MED ORDER — INSULIN ASPART 100 UNIT/ML ~~LOC~~ SOLN: 0.0000 [IU] | Freq: Three times a day (TID) | SUBCUTANEOUS | Status: AC

## 2012-02-01 MED ORDER — LACTULOSE 10 GM/15ML PO SOLN: 20.0000 g | Freq: Every day | ORAL | Status: DC

## 2012-02-01 MED ORDER — OXYCODONE HCL ER 10 MG PO T12A: 10.0000 mg | EXTENDED_RELEASE_TABLET | Freq: Two times a day (BID) | ORAL | Status: DC

## 2012-02-01 MED ORDER — METOPROLOL TARTRATE 12.5 MG HALF TABLET: 12.5000 mg | ORAL_TABLET | Freq: Two times a day (BID) | ORAL | Status: AC

## 2012-02-01 MED ORDER — OXYCODONE HCL 5 MG PO TABS: 5.0000 mg | ORAL_TABLET | Freq: Three times a day (TID) | ORAL | Status: AC | PRN

## 2012-02-01 MED ORDER — SENNOSIDES-DOCUSATE SODIUM 8.6-50 MG PO TABS: 1.0000 | ORAL_TABLET | Freq: Every day | ORAL | Status: DC

## 2012-02-01 NOTE — Care Management Note (Signed)
    Page 1 of 1   02/01/2012     11:44:57 AM   CARE MANAGEMENT NOTE 02/01/2012  Patient:  Shannon Charles, Shannon Charles   Account Number:  [id number]  Date Initiated:  01/23/2012  Documentation initiated by:  [facility]  Subjective/Objective Assessment:   Admitted with hgb 6.2, TIA.     Action/Plan:   PT/OT evals-PT recommending SNF   Anticipated DC Date:  02/01/2012   Anticipated DC Plan:  SKILLED NURSING FACILITY  In-house referral  Clinical Social Worker      DC Planning Services  CM consult      Choice offered to / List presented to:             Status of service:  Completed, signed off Medicare Important Message given?   (If response is "NO", the following Medicare IM given date fields will be blank) Date Medicare IM given:   Date Additional Medicare IM given:    Discharge Disposition:  SKILLED NURSING FACILITY  Per UR Regulation:  Reviewed for med. necessity/level of care/duration of stay  If discussed at Long Length of Stay Meetings, dates discussed:   01/29/2012  01/31/2012    Comments:  02/01/12 Dr. Sharl Ma requested follow up appt with GI due to positive guiac. Made appt with Jarold Song with Dr. Joanell Rising for 02/05/12 at 11:15 [phone]. Faxed Hand P and d/c summary to [phone] Harrell Gave.Placed appt in d/c instructions and informed patient's RN. Contacted patient's granddaughter Kirin Brandenburger and informed her of appt per patient's request. Dede Query CSW informed of appt. Patient to discharge to [facility] today. Jacquelynn Cree RN, BSN, CCM

## 2012-02-01 NOTE — Clinical Social Work Note (Signed)
Clinical Social Work  Pt is ready for discharge to [facility]. Facility has received discharge summary and is ready to admit pt. Blue Medicare prior Berkley Harvey has been obtained, as well as ambulance auth. Pt and family are agreeable to discharge plan. [facility] will provide transportation, as pt requires continuous oxygen and is unable to ambulate. CSW is signing off as no further needs identified.   Dede Query, MSW, [facility]ors [phone]

## 2012-02-01 NOTE — Progress Notes (Signed)
Called to give report on patient, assessments remained unchanged prior to discharge,[facility] here to pick patient up to the facility.

## 2012-02-01 NOTE — Discharge Summary (Signed)
Physician Discharge Summary  Shannon Charles:[id number] DOB: [birth year] DOA: 01/22/2012  PCP: Romero Belling, MD  Admit date: 01/22/2012 Discharge date: 02/01/2012  Time spent: 45  minutes  Recommendations for Outpatient Follow-up:  1. Follow up PCP in 4 weeks 2. Outpatient GI follow up for guaic positive stools  Discharge Diagnoses:  Principal Problem:  *T12 compression fracture Active Problems:  HYPOTHYROIDISM  HYPERLIPIDEMIA  DEMENTIA  HYPERTENSION  BACK PAIN, LUMBAR  Bell's palsy  Hepatic encephalopathy  Hypokalemia  Diabetes mellitus  Cirrhosis  Pulmonary edema  Anasarca  Lung disease, restrictive  TIA (transient ischemic attack)  Anemia, blood loss   Discharge Condition: Stable  Diet recommendation: Carb modified diet  Filed Weights   01/22/12 1432 01/31/12 0603  Weight: 92.987 kg (205 lb) 91.9 kg (202 lb 9.6 oz)    History of present illness:  Patient is an 78 y/o with h/o NASH with hepatic encephalopathy, dementia, chronic pain, hypothyroidism, HTN, DM, CKD, that presented to the hospital after developing weakness and sudden onset facial droop. Reportedly patient fell while trying to go to the bathroom and at that time husband reported that patient had slurring of speech and R facial droop. She was taken to the hospital for further work up. While in house work up for stroke was negative. Patient symptoms completely resolved and patient continued to complain of back discomfort. X ray of back was obtained which showed a new compression fracture at T 12. Orthopaedic surgeon was consulted  planned Kyphoplasty   Hospital Course:   TIA given negative work up (CT/MRI) for stroke  Patient's neurological symptoms improved in the hospital, CT?MRI head was negative.- Carotid duplex shows no evidence of significant internal carotid artery stenosis.Patient's aspirin was held in the hospital duet to GI Bleed, we are going to resume aspirin 81  Mg EC   po daily.  ECHO shows  EF of 65-70% with grade 1 diastolic dysfunction.  Symptomatic anemia:  Likely due to GI blood loss with upper more likely given melena. DDx includes gastritis, PUD, AVM, portal hypertensive gastropathy  - Resolved after Tx 2 units PRBC Hemoglobin after transfusion 8.6 and has been steady at this juncture with last hgb up from 8.8 to 9.2.  - Will need GI follow up as outpatient.   Tachycardia and tachypnea:  Most likely due to pain from back and UTI. No evidence of wheeze or rales on exam and CXR negative for pneumonia. Restrictive lung disease likely contributing.  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 Ammonia level improved from prior  -was started on Rifaximin, and low dose lactulose. She is stable will discontinue rifaximin and continue with lactulose. Abd [place] ordered initially which showed small to moderate volume of scattered abd. ascities   UTI: Positive nitrites  Patient had UTI with urine culture growing E coli on 01/22/12. She was started on levaquin, completed the antibiotic course, she does not need any more antibiotics at this time.   Diabetes Mellitus Have been started on lantus and SSI. Blood glucose is controlled.  Hypertension Patient was started on metoprolol in the hospital, will also continue cozaar.   Chronic pain, currently uncontrolled:  Pain controlled on oxycontin 10 mg po q 12 hr, also continue oxycodone prn   Dementia, exacerbated by acute infection, possible TIA:  -Continue memantine and donepezil   Depression/anxiety:  -Stable, continue zoloft  Hypothyroidism:  -Stable, continue synthroid.   T12 compression fracture    - Patient did not get clearance from insurance for the kyphoplasty.Will continue on conservative management before re appealing the denial. We have also got back brace to help with physical therapy.     Procedures: MRI, US abdomen, chest x ray, CT head  Consultations:  Orthopedics  Discharge Exam: Filed Vitals:   01/31/12 1809 01/31/12 2126 02/01/12 0221 02/01/12 0546  BP: 115/58 159/62 121/57 136/74  Pulse: 85 91 98 98  Temp: 98.2 F (36.8 C) 98.2 F (36.8 C) 98.3 F (36.8 C) 98.2 F (36.8 C)  TempSrc: Oral Oral Oral Oral  Resp: 20 18 17 18   Height:      Weight:      SpO2: 94% 97% 98% 100%    General: appear in no acute distress Cardiovascular: s1s2 RRR Respiratory:  Clear bilaterally Ext; no edema  Discharge Instructions  Discharge Orders    Future Appointments: Provider: Department: Dept Phone: Center:   02/06/2012 11:30 AM Ashok Cordia, MD [facility] [phone] [facility]     Future Orders Please Complete By Expires   Diet - low sodium heart healthy      Increase activity slowly      Discharge instructions      Comments:   Follow up GI as outpatient for guaic positive stools       Medication List     As of 02/01/2012  9:43 AM    STOP taking these medications         insulin NPH 100 UNIT/ML injection   Commonly known as: HUMULIN N,NOVOLIN N      TAKE these medications         amitriptyline 25 MG tablet   Commonly known as: ELAVIL   Take 25 mg by mouth at bedtime.      aspirin 81 MG tablet   Take 81 mg by mouth daily.      donepezil 10 MG tablet   Commonly known as: ARICEPT   Take 10 mg by mouth daily.      dorzolamide-timolol 22.3-6.8 MG/ML ophthalmic solution   Commonly known as: COSOPT   Place 1 drop into both eyes 2 (two) times daily.      furosemide 40 MG tablet   Commonly known as: LASIX   Take 1 tablet (40 mg total) by mouth 3 (three) times daily.      insulin aspart 100 UNIT/ML injection   Commonly known as: novoLOG   Inject 0-9 Units into the skin 3 (three) times daily with meals.      insulin glargine 100 UNIT/ML injection   Commonly known as: LANTUS   Inject 35 Units into the skin  daily.      latanoprost 0.005 % ophthalmic solution   Commonly known as: XALATAN   Place 1 drop into both eyes at bedtime.      levothyroxine 200 MCG tablet   Commonly known as: SYNTHROID, LEVOTHROID   Take 200 mcg by mouth daily.      LORazepam 0.5 MG tablet   Commonly known as: ATIVAN   Take 1 tablet (0.5 mg total) by mouth 2 (two) times daily as needed. For anxiety      losartan 100 MG tablet   Commonly known as: COZAAR   Take 100 mg by mouth daily.      memantine 10 MG tablet   Commonly known as: NAMENDA   Take 1 tablet (10 mg total)  by mouth 2 (two) times daily.      metoprolol tartrate 12.5 mg Tabs   Commonly known as: LOPRESSOR   Take 0.5 tablets (12.5 mg total) by mouth 2 (two) times daily.      OxyCODONE 10 mg Tb12   Commonly known as: OXYCONTIN   Take 1 tablet (10 mg total) by mouth every 12 (twelve) hours.      oxyCODONE 5 MG immediate release tablet   Commonly known as: Oxy IR/ROXICODONE   Take 1 tablet (5 mg total) by mouth every 8 (eight) hours as needed for pain.      potassium chloride SA 20 MEQ tablet   Commonly known as: K-DUR,KLOR-CON   Take 20 mEq by mouth 3 (three) times daily.      senna-docusate 8.6-50 MG per tablet   Commonly known as: Senokot-S   Take 1 tablet by mouth at bedtime.      sertraline 50 MG tablet   Commonly known as: ZOLOFT   Take 100 mg by mouth daily.      simvastatin 40 MG tablet   Commonly known as: ZOCOR   Take 40 mg by mouth every evening.      Vitamin D (Ergocalciferol) 50000 UNITS Caps   Commonly known as: DRISDOL   Take 50,000 Units by mouth every 7 (seven) days.          The results of significant diagnostics from this hospitalization (including imaging, microbiology, ancillary and laboratory) are listed below for reference.    Significant Diagnostic Studies: Dg Chest 1 View  01/24/2012  *RADIOLOGY REPORT*  Clinical Data: Shortness of breath, fever, leukocytosis  CHEST - 1 VIEW  Comparison: 01/22/2012   Findings: Cardiomegaly again noted.  There is poor inspiration. Persistent elevation of the right hemidiaphragm.  Persistent small right pleural effusion with right basilar atelectasis or infiltrate.  Stable left basilar atelectasis.  Central vascular congestion without convincing pulmonary edema.  IMPRESSION:  Persistent elevation of the right hemidiaphragm.  Persistent small right pleural effusion with right basilar atelectasis or infiltrate.  Stable left basilar atelectasis.  Central vascular congestion without convincing pulmonary edema.   Original Report Authenticated By: Natasha Mead, M.D.    Dg Chest 1 View  01/22/2012  *RADIOLOGY REPORT*  Clinical Data: Altered mental status.  Shortness of breath.  CHEST - 1 VIEW  Comparison: Chest x-ray 11/07/2011.  Findings: Lung volumes are very low, and there is persistent elevation of the right hemidiaphragm (noted on prior).  Even allowing for these low lung volumes, there is blunting of the right costophrenic sulcus, suggesting a small right pleural effusion. Crowding of the pulmonary vasculature is noted, without frank pulmonary edema.  Bibasilar opacities are favored to represent subsegmental atelectasis, however, underlying airspace consolidation is difficult to exclude.  Mild cardiomegaly is unchanged. The patient is rotated to the right on today's exam, resulting in distortion of the mediastinal contours and reduced diagnostic sensitivity and specificity for mediastinal pathology. Atherosclerosis in the thoracic aorta.  Calcified mediastinal and bilateral hilar lymph nodes suggesting old granulomatous disease.  IMPRESSION: 1.  Low lung volumes with probable bibasilar subsegmental atelectasis and a small right pleural effusion. 2.  Cardiomegaly with pulmonary venous congestion, but no frank pulmonary edema at this time. 3.  Atherosclerosis. 4.  Sequelae of old granulomatous disease.   Original Report Authenticated By: Florencia Reasons, M.D.    Dg Lumbar Spine  Complete  01/26/2012  *RADIOLOGY REPORT*  Clinical Data: Lower back pain  LUMBAR SPINE - COMPLETE 4+  VIEW  Comparison: 07/06/2010  Findings: Normal alignment of the lumbar spine.  There is a moderate to severe compression deformity involving the T12 vertebra.  This is new since 07/06/2010.  The remaining visualized vertebral body heights are preserved.  Mild degenerative disc disease is noted at L4-5 and L5 S1.  IMPRESSION:  1.  T12 compression fracture is new from 07/06/2010.   Original Report Authenticated By: Signa Kell, M.D.    Ct Head Wo Contrast  01/22/2012  *RADIOLOGY REPORT*  Clinical Data: Facial droop.  CT HEAD WITHOUT CONTRAST  Technique:  Contiguous axial images were obtained from the base of the skull through the vertex without contrast.  Comparison: July 14, 2011.  Findings: Bony calvarium is intact.  No mass effect or midline shift is noted.  Ventricular size is within normal limits.  There is no evidence of mass lesion, hemorrhage or acute infarction.  IMPRESSION: No acute intracranial abnormality is noted.   Original Report Authenticated By: Venita Sheffield., M.D.    Mr Brain Wo Contrast  01/23/2012  *RADIOLOGY REPORT*  Clinical Data: Altered mental status.  Rule out stroke.  MRI HEAD WITHOUT CONTRAST  Technique:  Multiplanar, multiecho pulse sequences of the brain and surrounding structures were obtained according to standard protocol without intravenous contrast.  Comparison: CT 01/22/2012  Findings: Image quality degraded by a moderate amount of motion. The patient was not able to perform MRI at this time due to inability to hold still.  Generalized atrophy.  Negative for acute infarct. Mild chronic microvascular ischemia in the cerebral white matter.  Small chronic infarct in the right thalamus.  Negative for hemorrhage or mass lesion.  No shift of the midline structures.  Paranasal sinuses are clear.  IMPRESSION: Image quality degraded by motion.  No acute abnormality is  identified.   Original Report Authenticated By: Camelia Phenes, M.D.    Mr Lumbar Spine Wo Contrast  01/27/2012  *RADIOLOGY REPORT*  Clinical Data: Chronic low level back pain with marked increase in severity following a recent fall.  MRI LUMBAR SPINE WITHOUT CONTRAST  Technique:  Multiplanar and multiecho pulse sequences of the lumbar spine were obtained without intravenous contrast.  Comparison: MRI lumbar spine 07/06/2010.  Plain films 01/26/2012.  Findings: The patient had difficulty remaining motionless for the study.  Images are suboptimal.  Small or subtle lesions could be overlooked.  Five lumbar type vertebral bodies assumed.  Anatomic alignment. Scan extends from T9-10 disc space through the upper sacrum.  There is diffuse decreased signal of the visualized bone marrow. While a widespread neoplastic process is not excluded, the findings likely relate to severe chronic disease of cirrhosis along with anemia.  Correlate clinically.  There is an acute compression fracture of T12 with biconcave deformity of the superior and inferior endplates.  Slight anterior wedging is noted.  Prolonged T1 and T2 signal within the T12 vertebral body indicative of acute bone marrow edema.  Minimal retropulsion posteriorly.  Adequate diameter of the spinal canal without epidural hematoma or conus compression.  There is no visible lumbar disc protrusion.  Mild spinal stenosis at L3-4 and L4-5 relates to facet arthropathy and ligamentum flavum hypertrophy.  Mild annular bulging L5-S1. Schmorl's nodes result in endplate reactive changes above and below L4-5.  Compared with 07/06/2010, the T12 fracture was not present. The marrow changes have progressed since that time.  IMPRESSION: Diffuse decreased signal of the visualized bone marrow likely related to chronic disease/anemia.  Acute compression fracture T12 with biconcave  deformity of the superior and inferior endplates.  No significant retropulsion.   Original Report  Authenticated By: Davonna Belling, M.D.    US Abdomen Complete  01/22/2012  *RADIOLOGY REPORT*  Clinical Data:  Cirrhosis.  Abdominal distention.  COMPLETE ABDOMINAL ULTRASOUND  Comparison:  Ultrasound 07/15/2011.  Findings:  Gallbladder:  There are some small stones and sludge within the gallbladder.  No wall thickening is identified.  Common bile duct:  Measures 0.5 cm.  Liver:  The liver is shrunken with a nodular contour consistent with cirrhosis.  Small to moderate volume of ascites is present scattered through the abdomen. Sonographer reports right upper quadrant tenderness.  IVC:  Appears normal.  Pancreas:  No focal abnormality seen.  Spleen:  Measures 7.2 cm and appears normal.  Right Kidney:  Measures 9.3 cm and appears normal.  Left Kidney:  Measures 9.8 cm and appears normal.  Abdominal aorta:  No aneurysm identified.  IMPRESSION:  1.  Cirrhosis with a small to moderate volume of scattered abdominal ascites. 2.  Small gallstones without gallbladder wall thickening.   Original Report Authenticated By: Bernadene Bell. Maricela Curet, M.D.     Microbiology: Recent Results (from the past 240 hour(s))  URINE CULTURE     Status: Normal   Collection Time   01/22/12  4:32 PM      Component Value Range Status Comment   Specimen Description URINE, CATHETERIZED   Final    Special Requests NONE   Final    Culture  Setup Time 01/22/2012 19:13   Final    Colony Count >=100,000 COLONIES/ML   Final    Culture ESCHERICHIA COLI   Final    Report Status 01/24/2012 FINAL   Final    Organism ID, Bacteria ESCHERICHIA COLI   Final   URINE CULTURE     Status: Normal   Collection Time   01/28/12  2:00 AM      Component Value Range Status Comment   Specimen Description URINE, CLEAN CATCH   Final    Special Requests levaquin, rifaximin Normal   Final    Culture  Setup Time 01/28/2012 03:05   Final    Colony Count NO GROWTH   Final    Culture NO GROWTH   Final    Report Status 01/29/2012 FINAL   Final   SURGICAL PCR  SCREEN     Status: Normal   Collection Time   01/28/12  5:23 AM      Component Value Range Status Comment   MRSA, PCR NEGATIVE  NEGATIVE Final    Staphylococcus aureus NEGATIVE  NEGATIVE Final      Labs: Basic Metabolic Panel:  Lab 01/31/12 1610 01/30/12 0548 01/29/12 0637 01/28/12 0545 01/27/12 0615 01/26/12 0550 01/25/12 1255  NA 146* -- 145 147* 144 151* --  K 3.9 3.3* 3.1* 3.1* 3.3* -- --  CL 105 -- 106 107 103 108 --  CO2 35* -- 31 34* 35* 35* --  GLUCOSE 163* -- 116* 72 80 89 --  BUN 17 -- 20 20 21 19  --  CREATININE 1.10 -- 1.08 1.11* 1.18* 1.20* --  CALCIUM 8.9 -- 8.5 8.4 8.4 8.3* --  MG -- -- -- -- -- -- 2.1  PHOS -- -- -- -- -- -- 3.8   Liver Function Tests: No results found for this basename: AST:5,ALT:5,ALKPHOS:5,BILITOT:5,PROT:5,ALBUMIN:5 in the last 168 hours No results found for this basename: LIPASE:5,AMYLASE:5 in the last 168 hours No results found for this basename: AMMONIA:5 in the last 168  hours CBC:  Lab 01/31/12 1051 01/29/12 0637 01/28/12 0545 01/27/12 0615 01/26/12 0550  WBC 9.3 10.3 8.2 7.0 9.5  NEUTROABS -- -- -- -- --  HGB 9.5* 9.2* 8.8* 8.9* 8.9*  HCT 33.0* 31.1* 30.2* 31.3* 30.2*  MCV 91.2 88.6 88.8 90.2 89.1  PLT 108* 90* 97* 108* 113*   Cardiac Enzymes: No results found for this basename: CKTOTAL:5,CKMB:5,CKMBINDEX:5,TROPONINI:5 in the last 168 hours BNP: BNP (last 3 results)  Basename 12/05/11 1354 11/07/11 1531 07/02/11 1517  PROBNP 132.0* 184.7 52.0   CBG:  Lab 02/01/12 0652 01/31/12 2126 01/31/12 1629 01/31/12 1134 01/31/12 0651  GLUCAP 81 127* 140* 157* 115*       Signed:  Amnah Breuer S  [facility] 02/01/2012, 9:43 AM

## 2012-02-04 ENCOUNTER — Telehealth: Payer: Self-pay | Admitting: Physical Medicine and Rehabilitation

## 2012-02-04 ENCOUNTER — Other Ambulatory Visit: Payer: Self-pay | Admitting: *Deleted

## 2012-02-04 NOTE — Telephone Encounter (Signed)
Encounter open in error 

## 2012-02-04 NOTE — Telephone Encounter (Signed)
Patient is now in nursing home.  Please call with any questions.

## 2012-02-06 ENCOUNTER — Encounter: Payer: Medicare Other | Admitting: Physical Medicine and Rehabilitation

## 2012-02-06 NOTE — Telephone Encounter (Signed)
Pt now in nursing home.  Please clarify whether she will still come as outpt.  Cancel upcoming appt if not.

## 2012-02-07 ENCOUNTER — Inpatient Hospital Stay (HOSPITAL_COMMUNITY)
Admission: EM | Admit: 2012-02-07 | Discharge: 2012-02-24 | DRG: 871 | Disposition: E | Payer: Medicare Other | Attending: Internal Medicine | Admitting: Internal Medicine

## 2012-02-07 ENCOUNTER — Emergency Department (HOSPITAL_COMMUNITY): Payer: Medicare Other

## 2012-02-07 ENCOUNTER — Encounter (HOSPITAL_COMMUNITY): Payer: Self-pay

## 2012-02-07 DIAGNOSIS — Z90710 Acquired absence of both cervix and uterus: Secondary | ICD-10-CM

## 2012-02-07 DIAGNOSIS — R0600 Dyspnea, unspecified: Secondary | ICD-10-CM

## 2012-02-07 DIAGNOSIS — N179 Acute kidney failure, unspecified: Secondary | ICD-10-CM

## 2012-02-07 DIAGNOSIS — G309 Alzheimer's disease, unspecified: Secondary | ICD-10-CM | POA: Diagnosis present

## 2012-02-07 DIAGNOSIS — K7460 Unspecified cirrhosis of liver: Secondary | ICD-10-CM | POA: Diagnosis present

## 2012-02-07 DIAGNOSIS — Z66 Do not resuscitate: Secondary | ICD-10-CM | POA: Diagnosis present

## 2012-02-07 DIAGNOSIS — E785 Hyperlipidemia, unspecified: Secondary | ICD-10-CM | POA: Diagnosis present

## 2012-02-07 DIAGNOSIS — K7682 Hepatic encephalopathy: Secondary | ICD-10-CM | POA: Diagnosis present

## 2012-02-07 DIAGNOSIS — R4182 Altered mental status, unspecified: Secondary | ICD-10-CM

## 2012-02-07 DIAGNOSIS — M1710 Unilateral primary osteoarthritis, unspecified knee: Secondary | ICD-10-CM | POA: Diagnosis present

## 2012-02-07 DIAGNOSIS — K7290 Hepatic failure, unspecified without coma: Secondary | ICD-10-CM

## 2012-02-07 DIAGNOSIS — G894 Chronic pain syndrome: Secondary | ICD-10-CM | POA: Diagnosis present

## 2012-02-07 DIAGNOSIS — E870 Hyperosmolality and hypernatremia: Secondary | ICD-10-CM | POA: Diagnosis not present

## 2012-02-07 DIAGNOSIS — F329 Major depressive disorder, single episode, unspecified: Secondary | ICD-10-CM | POA: Diagnosis present

## 2012-02-07 DIAGNOSIS — D649 Anemia, unspecified: Secondary | ICD-10-CM | POA: Diagnosis present

## 2012-02-07 DIAGNOSIS — I10 Essential (primary) hypertension: Secondary | ICD-10-CM | POA: Diagnosis present

## 2012-02-07 DIAGNOSIS — R627 Adult failure to thrive: Secondary | ICD-10-CM | POA: Diagnosis present

## 2012-02-07 DIAGNOSIS — F3289 Other specified depressive episodes: Secondary | ICD-10-CM | POA: Diagnosis present

## 2012-02-07 DIAGNOSIS — E669 Obesity, unspecified: Secondary | ICD-10-CM | POA: Diagnosis present

## 2012-02-07 DIAGNOSIS — Z6839 Body mass index (BMI) 39.0-39.9, adult: Secondary | ICD-10-CM

## 2012-02-07 DIAGNOSIS — E875 Hyperkalemia: Secondary | ICD-10-CM | POA: Diagnosis present

## 2012-02-07 DIAGNOSIS — M1990 Unspecified osteoarthritis, unspecified site: Secondary | ICD-10-CM | POA: Diagnosis present

## 2012-02-07 DIAGNOSIS — F411 Generalized anxiety disorder: Secondary | ICD-10-CM | POA: Diagnosis present

## 2012-02-07 DIAGNOSIS — E109 Type 1 diabetes mellitus without complications: Secondary | ICD-10-CM | POA: Diagnosis present

## 2012-02-07 DIAGNOSIS — Z8673 Personal history of transient ischemic attack (TIA), and cerebral infarction without residual deficits: Secondary | ICD-10-CM

## 2012-02-07 DIAGNOSIS — R601 Generalized edema: Secondary | ICD-10-CM

## 2012-02-07 DIAGNOSIS — Z515 Encounter for palliative care: Secondary | ICD-10-CM

## 2012-02-07 DIAGNOSIS — Z794 Long term (current) use of insulin: Secondary | ICD-10-CM

## 2012-02-07 DIAGNOSIS — K769 Liver disease, unspecified: Secondary | ICD-10-CM | POA: Diagnosis present

## 2012-02-07 DIAGNOSIS — E119 Type 2 diabetes mellitus without complications: Secondary | ICD-10-CM | POA: Diagnosis present

## 2012-02-07 DIAGNOSIS — N170 Acute kidney failure with tubular necrosis: Secondary | ICD-10-CM | POA: Diagnosis present

## 2012-02-07 DIAGNOSIS — Z87440 Personal history of urinary (tract) infections: Secondary | ICD-10-CM

## 2012-02-07 DIAGNOSIS — M8448XA Pathological fracture, other site, initial encounter for fracture: Secondary | ICD-10-CM | POA: Diagnosis present

## 2012-02-07 DIAGNOSIS — G9340 Encephalopathy, unspecified: Secondary | ICD-10-CM | POA: Diagnosis present

## 2012-02-07 DIAGNOSIS — Z79899 Other long term (current) drug therapy: Secondary | ICD-10-CM

## 2012-02-07 DIAGNOSIS — Z7982 Long term (current) use of aspirin: Secondary | ICD-10-CM

## 2012-02-07 DIAGNOSIS — J189 Pneumonia, unspecified organism: Secondary | ICD-10-CM | POA: Diagnosis present

## 2012-02-07 DIAGNOSIS — A419 Sepsis, unspecified organism: Secondary | ICD-10-CM | POA: Diagnosis present

## 2012-02-07 DIAGNOSIS — J45909 Unspecified asthma, uncomplicated: Secondary | ICD-10-CM | POA: Diagnosis present

## 2012-02-07 DIAGNOSIS — E039 Hypothyroidism, unspecified: Secondary | ICD-10-CM | POA: Diagnosis present

## 2012-02-07 DIAGNOSIS — G9341 Metabolic encephalopathy: Secondary | ICD-10-CM | POA: Diagnosis present

## 2012-02-07 DIAGNOSIS — R9431 Abnormal electrocardiogram [ECG] [EKG]: Secondary | ICD-10-CM

## 2012-02-07 DIAGNOSIS — F0280 Dementia in other diseases classified elsewhere without behavioral disturbance: Secondary | ICD-10-CM | POA: Diagnosis present

## 2012-02-07 LAB — LACTIC ACID, PLASMA: Lactic Acid, Venous: 1.2 mmol/L (ref 0.5–2.2)

## 2012-02-07 LAB — COMPREHENSIVE METABOLIC PANEL
ALT: 13 U/L (ref 0–35)
AST: 38 U/L — ABNORMAL HIGH (ref 0–37)
Albumin: 2.3 g/dL — ABNORMAL LOW (ref 3.5–5.2)
Alkaline Phosphatase: 142 U/L — ABNORMAL HIGH (ref 39–117)
BUN: 60 mg/dL — ABNORMAL HIGH (ref 6–23)
Chloride: 107 mEq/L (ref 96–112)
Potassium: 6.6 mEq/L (ref 3.5–5.1)
Sodium: 142 mEq/L (ref 135–145)
Total Bilirubin: 0.4 mg/dL (ref 0.3–1.2)
Total Protein: 6.7 g/dL (ref 6.0–8.3)

## 2012-02-07 LAB — CBC WITH DIFFERENTIAL/PLATELET
Basophils Absolute: 0.1 10*3/uL (ref 0.0–0.1)
Basophils Relative: 1 % (ref 0–1)
Eosinophils Absolute: 0.3 10*3/uL (ref 0.0–0.7)
Hemoglobin: 9.5 g/dL — ABNORMAL LOW (ref 12.0–15.0)
MCHC: 28.5 g/dL — ABNORMAL LOW (ref 30.0–36.0)
Monocytes Relative: 9 % (ref 3–12)
Neutro Abs: 8.5 10*3/uL — ABNORMAL HIGH (ref 1.7–7.7)
Neutrophils Relative %: 79 % — ABNORMAL HIGH (ref 43–77)
Platelets: 368 10*3/uL (ref 150–400)

## 2012-02-07 LAB — URINALYSIS, ROUTINE W REFLEX MICROSCOPIC
Bilirubin Urine: NEGATIVE
Glucose, UA: NEGATIVE mg/dL
Hgb urine dipstick: NEGATIVE
Protein, ur: NEGATIVE mg/dL
Urobilinogen, UA: 1 mg/dL (ref 0.0–1.0)

## 2012-02-07 MED ORDER — SODIUM CHLORIDE 0.9 % IV BOLUS (SEPSIS)
1000.0000 mL | Freq: Once | INTRAVENOUS | Status: AC
  Administered 2012-02-07: 1000 mL via INTRAVENOUS

## 2012-02-07 MED ORDER — SODIUM POLYSTYRENE SULFONATE 15 GM/60ML PO SUSP
30.0000 g | Freq: Once | ORAL | Status: AC
  Administered 2012-02-07: 30 g via ORAL
  Filled 2012-02-07: qty 120

## 2012-02-07 MED ORDER — VANCOMYCIN HCL 1000 MG IV SOLR
1500.0000 mg | Freq: Once | INTRAVENOUS | Status: AC
  Administered 2012-02-07: 1500 mg via INTRAVENOUS
  Filled 2012-02-07: qty 1500

## 2012-02-07 MED ORDER — PIPERACILLIN-TAZOBACTAM 3.375 G IVPB
3.3750 g | Freq: Once | INTRAVENOUS | Status: AC
  Administered 2012-02-07: 3.375 g via INTRAVENOUS
  Filled 2012-02-07: qty 50

## 2012-02-07 MED ORDER — DEXTROSE 50 % IV SOLN
1.0000 | Freq: Once | INTRAVENOUS | Status: AC
  Administered 2012-02-07: 50 mL via INTRAVENOUS
  Filled 2012-02-07: qty 50

## 2012-02-07 MED ORDER — LACTULOSE 10 GM/15ML PO SOLN
20.0000 g | Freq: Once | ORAL | Status: AC
  Administered 2012-02-07: 20 g via ORAL
  Filled 2012-02-07: qty 30

## 2012-02-07 MED ORDER — INSULIN ASPART 100 UNIT/ML ~~LOC~~ SOLN
10.0000 [IU] | Freq: Once | SUBCUTANEOUS | Status: AC
  Administered 2012-02-07: 10 [IU] via INTRAVENOUS
  Filled 2012-02-07: qty 1

## 2012-02-07 NOTE — Code Documentation (Signed)
Called to Code Stroke in ED at 1716.  See ED notes for Code Stroke times.  On arrival patient drowsy - will open eyes and moans - no following commands.  Skin hot to touch.  To CT scan.  On exam patient with bilateral weakness noted - has tone in both arms but very weak - little movement noted - wiggles fingers on both hands but very minimal.  Will occasionally nod to questions.  Recently discharged from hospital s/p thoracic spine fx from a fall- also being treated times one week with IV Rocephin for E. Coli UTI.  Weakness noted bil - does have right facial weakness but also has history of Bell's palsy. Currently resides in SNF - staff there report seeing her last at 1400 - she was at her neuro baseline.  BP 116/52 HR 72 RR 24 and labored - O2 sat 100% on NRB mask.   98.7 oral temp - Needs rectal temp - mouth breathing. CBG in field was 85.  NS infusing Left PIV.  Labs drawn.  No true focal deficiets noted - global weakness -altered mental status -  also with hx hepatic encephalopathy.  Dr. Roseanne Reno present - code stroke cancelled.  Dr. Lynelle Doctor present - aware.

## 2012-02-07 NOTE — ED Provider Notes (Signed)
I saw and evaluated the patient, reviewed the resident's note and I agree with the findings and plan. I reviewed and interpreted the EKG during the patient's evaluation in the ED and agree with the resident's interpretation.  Pt with recurrent AMS.  Came in as a code stroke but that was cancelled.  Pt's symptoms are consistent with recurrent hepatic encephalopathy.  Treated for HCAP. She has remained agitated in the ED but stable.    Celene Kras, MD 02/08/2012 [phone]

## 2012-02-07 NOTE — ED Notes (Signed)
Pt from [place], LSN at 1400, pt with right facial droop, right weakness in hand, right facial droop garbaled speech

## 2012-02-07 NOTE — H&P (Addendum)
Shannon Charles is an 78 y.o. female.   Patient was seen and examined on February 07, 2012. PCP - Dr. Romero Belling. History obtained from ER physician family and previous records as patient is confused. Chief Complaint: Altered mental status. HPI: 78 year old female who was just recently discharged after being worked up for TIA and treated for UTI and had T12 compression fracture with history of cirrhosis secondary to [name] was found to be confused at around 4 PM today and per that was normal. Patient was initially brought to the ER as a code stroke but the patient did not have any focal deficits and CT was negative code stroke was canceled. Subsequent labs showed increased ammonia levels and chest x-ray was showing infiltrates compatible with pneumonia. In addition patient's creatinine has increased to 3 from normal last. Patient has been admitted for encephalopathy probably secondary to hepatic and metabolic reasons with pneumonia and renal failure. Patient at this time is alert and is oriented to his name and follows commands but looks irritable. As per family patient did not have any nausea vomiting chest pain or any diarrhea. Patient did have mild shortness of breath.  Past Medical History  Diagnosis Date  . DEMENTIA 08/23/2006  . DEPRESSION 08/23/2006  . DIABETES MELLITUS, TYPE I 08/23/2006  . HYPERLIPIDEMIA 08/23/2006  . HYPERTENSION 08/23/2006  . HYPOTHYROIDISM 08/23/2006  . OSTEOARTHRITIS 08/23/2006  . RENAL INSUFFICIENCY 08/23/2006  . Personal history of colonic polyps 04/29/2007  . Lung disease, restrictive     Mild, by pulmonary function test in October 2006 with mildly reduce DLCO  . Pain in limb   . Chronic pain syndrome   . Lack of coordination   . Lumbago   . Primary localized osteoarthrosis, lower leg   . Disorders of bursae and tendons in shoulder region, unspecified   . Bell's palsy     per Husband  . Cirrhosis     Past Surgical History  Procedure Date  . Total abdominal  hysterectomy   . Colonoscopy w/ polypectomy     Family History  Problem Relation Age of Onset  . Cancer Sister     uncertain type  . Cirrhosis Mother    Social History:  reports that she has never smoked. She does not have any smokeless tobacco history on file. She reports that she does not drink alcohol or use illicit drugs.  Allergies: No Known Allergies   (Not in a hospital admission)  Results for orders placed during the hospital encounter of 02/03/2012 (from the past 48 hour(s))  CBC WITH DIFFERENTIAL     Status: Abnormal   Collection Time   02/13/2012  5:45 PM      Component Value Range Comment   WBC 10.8 (*) 4.0 - 10.5 K/uL    RBC 3.67 (*) 3.87 - 5.11 MIL/uL    Hemoglobin 9.5 (*) 12.0 - 15.0 g/dL    HCT 16.1 (*) 09.6 - 46.0 %    MCV 90.7  78.0 - 100.0 fL    MCH 25.9 (*) 26.0 - 34.0 pg    MCHC 28.5 (*) 30.0 - 36.0 g/dL    RDW 04.5 (*) 40.9 - 15.5 %    Platelets 368  150 - 400 K/uL    Neutrophils Relative 79 (*) 43 - 77 %    Neutro Abs 8.5 (*) 1.7 - 7.7 K/uL    Lymphocytes Relative 8 (*) 12 - 46 %    Lymphs Abs 0.9  0.7 - 4.0 K/uL  Monocytes Relative 9  3 - 12 %    Monocytes Absolute 1.0  0.1 - 1.0 K/uL    Eosinophils Relative 3  0 - 5 %    Eosinophils Absolute 0.3  0.0 - 0.7 K/uL    Basophils Relative 1  0 - 1 %    Basophils Absolute 0.1  0.0 - 0.1 K/uL   COMPREHENSIVE METABOLIC PANEL     Status: Abnormal   Collection Time   2012/02/19  5:45 PM      Component Value Range Comment   Sodium 142  135 - 145 mEq/L    Potassium 6.6 (*) 3.5 - 5.1 mEq/L    Chloride 107  96 - 112 mEq/L    CO2 29  19 - 32 mEq/L    Glucose, Bld 82  70 - 99 mg/dL    BUN 60 (*) 6 - 23 mg/dL    Creatinine, Ser 4.78 (*) 0.50 - 1.10 mg/dL    Calcium 8.7  8.4 - 29.5 mg/dL    Total Protein 6.7  6.0 - 8.3 g/dL    Albumin 2.3 (*) 3.5 - 5.2 g/dL    AST 38 (*) 0 - 37 U/L    ALT 13  0 - 35 U/L    Alkaline Phosphatase 142 (*) 39 - 117 U/L    Total Bilirubin 0.4  0.3 - 1.2 mg/dL    GFR calc non Af  Amer 11 (*) >90 mL/min    GFR calc Af Amer 13 (*) >90 mL/min   AMMONIA     Status: Abnormal   Collection Time   02/19/2012  6:36 PM      Component Value Range Comment   Ammonia 100 (*) 11 - 60 umol/L   LACTIC ACID, PLASMA     Status: Normal   Collection Time   2012/02/19  7:00 PM      Component Value Range Comment   Lactic Acid, Venous 1.2  0.5 - 2.2 mmol/L   URINALYSIS, ROUTINE W REFLEX MICROSCOPIC     Status: Abnormal   Collection Time   02-19-12  7:41 PM      Component Value Range Comment   Color, Urine YELLOW  YELLOW    APPearance CLOUDY (*) CLEAR    Specific Gravity, Urine 1.014  1.005 - 1.030    pH 5.0  5.0 - 8.0    Glucose, UA NEGATIVE  NEGATIVE mg/dL    Hgb urine dipstick NEGATIVE  NEGATIVE    Bilirubin Urine NEGATIVE  NEGATIVE    Ketones, ur NEGATIVE  NEGATIVE mg/dL    Protein, ur NEGATIVE  NEGATIVE mg/dL    Urobilinogen, UA 1.0  0.0 - 1.0 mg/dL    Nitrite NEGATIVE  NEGATIVE    Leukocytes, UA NEGATIVE  NEGATIVE MICROSCOPIC NOT DONE ON URINES WITH NEGATIVE PROTEIN, BLOOD, LEUKOCYTES, NITRITE, OR GLUCOSE <1000 mg/dL.   Ct Head Wo Contrast  19-Feb-2012  *RADIOLOGY REPORT*  Clinical Data: Altered mental status, code stroke  CT HEAD WITHOUT CONTRAST  Technique:  Contiguous axial images were obtained from the base of the skull through the vertex without contrast.  Comparison: 01/22/2012, 01/23/2012  Findings: Stable mild brain atrophy.  No acute intracranial hemorrhage, acute infarction, mass lesion, midline shift, herniation, hydrocephalus, or extra-axial fluid collection.  Gray- white matter differentiation maintained.  Cisterns patent.  No cerebellar abnormality.  Mastoid sinuses clear.  IMPRESSION: No acute intracranial finding.  Critical Value/emergent results were called by telephone at the time of interpretation on 02/19/12 at 5:45  p.m. to Dr.Knapp, who verbally acknowledged these results.   Original Report Authenticated By: Judie Petit. Miles Costain, M.D.    Dg Chest Port 1 View  02/21/2012   *RADIOLOGY REPORT*  Clinical Data: Weakness history of dementia  PORTABLE CHEST - 1 VIEW  Comparison: 01/24/2012  Findings: Heart size is normal.  Lung volumes are low.  Bilateral airspace opacities are noted right greater than left.  No effusions identified.  IMPRESSION:  1.  Bilateral airspace opacities which may represent edema or multifocal infection.   Original Report Authenticated By: Signa Kell, M.D.     Review of Systems  Constitutional: Negative.   HENT: Negative.   Eyes: Negative.   Respiratory: Positive for shortness of breath.   Cardiovascular: Negative.   Gastrointestinal: Negative.   Genitourinary: Negative.   Musculoskeletal: Negative.   Skin: Negative.   Neurological:       Confusion.  Endo/Heme/Allergies: Negative.   Psychiatric/Behavioral: Negative.     Blood pressure 103/52, pulse 70, temperature 98.4 F (36.9 C), resp. rate 13, SpO2 99.00%. Physical Exam  Constitutional: She appears well-developed and well-nourished.  HENT:  Head: Normocephalic and atraumatic.  Right Ear: External ear normal.  Left Ear: External ear normal.  Mouth/Throat: No oropharyngeal exudate.  Eyes: Conjunctivae normal are normal. Pupils are equal, round, and reactive to light. Right eye exhibits no discharge.  Neck: Normal range of motion. Neck supple.  Cardiovascular: Normal rate and regular rhythm.   Respiratory: Effort normal and breath sounds normal. No respiratory distress. She has no wheezes. She has no rales.  GI: Soft. Bowel sounds are normal. She exhibits no distension. There is no tenderness. There is no rebound and no guarding.  Musculoskeletal: She exhibits no edema and no tenderness.  Neurological: She is alert.       Oriented to name. Follows commands and moves extremities.But patient looks irritable.   Skin: Skin is warm and dry.     Assessment/Plan #1. Acute encephalopathy probably secondary to hepatic encephalopathy and metabolic reasons - patient has been started  on lactulose for her hepatic encephalopathy. I have ordered 30 g every 6 hourly for now recheck ammonia levels to make sure it's showing an increasing trend. Her encephalopathy also been worsened because of the pneumonia and the renal failure. If mental status does not improve with measures for the above reasons and consider MRI brain. #2. Acute renal failure with hyperkalemia - patient was given Kayexalate 30 g by mouth along with D50 one ampule and insulin IV. Patient's renal failure is probably segmented dehydration and medications. At this time we will continue to hydrate closely follow metabolic panel and intake output. Hold off diuretics and Cozaar. Given that patient has cirrhosis is concerning that patient can develop hepatorenal syndrome. Check urine sodium. #3. Pneumonia - treat this as health care associated pneumonia. #4. Diabetes mellitus type 2 - continue home medications with slight scale coverage. #5. Hypothyroidism -  Check TSH and continue Synthroid. #6. Hypertension - hold Cozaar for now. #7. T12 compression fracture - patient is on pain relief medications. I have placed patient on low-dose fentanyl for now. #8. Depression and anxiety - for now holding off antidepressant and antianxiety medications which can be restarted once her mental status improves. #9. Anemia - closely follow CBC.  CODE STATUS - DO NOT RESUSCITATE as discussed with patient's family.   Nuriyah Hanline N. 02/18/2012, 10:50 PM Addendum - patient's potassium has come back still increased and creatinine still increased despite giving fluids. Patient still lethargic. I have ordered  stat dose of Kayexalate rectally along with D50 one ampoule, Novolog 10 units iv and calcium gluconate one gram iv. Patients repeat ammonia level is pending. If patient remains lethargic may need NGT. May Consider Palliative care consult for goals of care. I did discuss with patients family about the critical nature of patients  condition.  Midge Minium

## 2012-02-07 NOTE — ED Notes (Signed)
Code stroke Log times:  Encoded: 1718 Code stroke called: 1718 Patient arrival: 1719 EDP exam: 1719 Stroke team arrival 1719 Last seen normal: 1400 Patient arrival in CT: 1720 Phlebotomist arrived: 1719 Code stroke cancelled:  1745 per Dr. Roseanne Reno

## 2012-02-07 NOTE — ED Provider Notes (Signed)
History     CSN: [id number]  Arrival date & time 02/16/2012  1720   First MD Initiated Contact with Patient 02/22/2012 1724      No chief complaint on file.   (Consider location/radiation/quality/duration/timing/severity/associated sxs/prior treatment) HPI Comments: The nursing home staff run on the patient at 2:00 and she was at her baseline. They checked on her again at 4:00 and she was altered from her baseline, confused and making incomprehensible sounds.  Patient is a 78 y.o. female presenting with altered mental status. The history is provided by the EMS personnel. The history is limited by the condition of the patient. No language interpreter was used.  Altered Mental Status This is a recurrent problem. The current episode started today. The problem occurs constantly. The problem has been unchanged. Nothing aggravates the symptoms. She has tried nothing for the symptoms. The treatment provided no relief.    Past Medical History  Diagnosis Date  . DEMENTIA 08/23/2006  . DEPRESSION 08/23/2006  . DIABETES MELLITUS, TYPE I 08/23/2006  . HYPERLIPIDEMIA 08/23/2006  . HYPERTENSION 08/23/2006  . HYPOTHYROIDISM 08/23/2006  . OSTEOARTHRITIS 08/23/2006  . RENAL INSUFFICIENCY 08/23/2006  . Personal history of colonic polyps 04/29/2007  . Lung disease, restrictive     Mild, by pulmonary function test in October 2006 with mildly reduce DLCO  . Pain in limb   . Chronic pain syndrome   . Lack of coordination   . Lumbago   . Primary localized osteoarthrosis, lower leg   . Disorders of bursae and tendons in shoulder region, unspecified   . Bell's palsy     per Husband  . Cirrhosis     Past Surgical History  Procedure Date  . Total abdominal hysterectomy   . Colonoscopy w/ polypectomy     Family History  Problem Relation Age of Onset  . Cancer Sister     uncertain type  . Cirrhosis Mother     History  Substance Use Topics  . Smoking status: Never Smoker   . Smokeless tobacco: Not  on file  . Alcohol Use: No    OB History    Grav Para Term Preterm Abortions TAB SAB Ect Mult Living                  Review of Systems  Unable to perform ROS: Mental status change  Psychiatric/Behavioral: Positive for altered mental status.    Allergies  Review of patient's allergies indicates no known allergies.  Home Medications   Current Outpatient Rx  Name  Route  Sig  Dispense  Refill  . AMITRIPTYLINE HCL 25 MG PO TABS   Oral   Take 25 mg by mouth at bedtime.         . ASPIRIN 81 MG PO TBEC   Oral   Take 1 tablet (81 mg total) by mouth daily. Swallow whole.   30 tablet   12   . DONEPEZIL HCL 10 MG PO TABS   Oral   Take 10 mg by mouth daily.           . DORZOLAMIDE HCL-TIMOLOL MAL 22.3-6.8 MG/ML OP SOLN   Both Eyes   Place 1 drop into both eyes 2 (two) times daily.         . FUROSEMIDE 40 MG PO TABS   Oral   Take 1 tablet (40 mg total) by mouth 3 (three) times daily.   90 tablet   3   . INSULIN ASPART 100 UNIT/ML [place] SOLN  Subcutaneous   Inject 0-9 Units into the skin 3 (three) times daily with meals.   1 vial   1   . INSULIN GLARGINE 100 UNIT/ML [place] SOLN   Subcutaneous   Inject 35 Units into the skin daily.   10 mL   0   . LACTULOSE 10 GM/15ML PO SOLN   Oral   Take 30 mLs (20 g total) by mouth daily.   240 mL   0   . LATANOPROST 0.005 % OP SOLN   Both Eyes   Place 1 drop into both eyes at bedtime.         Marland Kitchen LEVOTHYROXINE SODIUM 200 MCG PO TABS   Oral   Take 200 mcg by mouth daily.         Marland Kitchen LORAZEPAM 0.5 MG PO TABS   Oral   Take 1 tablet (0.5 mg total) by mouth 2 (two) times daily as needed. For anxiety   10 tablet   0   . LOSARTAN POTASSIUM 100 MG PO TABS   Oral   Take 100 mg by mouth daily.         Marland Kitchen MEMANTINE HCL 10 MG PO TABS   Oral   Take 1 tablet (10 mg total) by mouth 2 (two) times daily.   180 tablet   11   . METOPROLOL TARTRATE 12.5 MG HALF TABLET   Oral   Take 0.5 tablets (12.5 mg total) by mouth 2  (two) times daily.   30 tablet   0   . OXYCODONE HCL 5 MG PO TABS   Oral   Take 1 tablet (5 mg total) by mouth every 8 (eight) hours as needed for pain.   30 tablet   0   . OXYCODONE HCL ER 10 MG PO T12A   Oral   Take 1 tablet (10 mg total) by mouth every 12 (twelve) hours.   60 tablet   0   . POTASSIUM CHLORIDE CRYS ER 20 MEQ PO TBCR   Oral   Take 20 mEq by mouth 3 (three) times daily.         Bernadette Hoit SODIUM 8.6-50 MG PO TABS   Oral   Take 1 tablet by mouth at bedtime.   30 tablet   0   . SERTRALINE HCL 50 MG PO TABS   Oral   Take 100 mg by mouth daily.         Marland Kitchen SIMVASTATIN 40 MG PO TABS   Oral   Take 40 mg by mouth every evening.         Marland Kitchen VITAMIN D (ERGOCALCIFEROL) 50000 UNITS PO CAPS   Oral   Take 50,000 Units by mouth every 7 (seven) days.           There were no vitals taken for this visit.  Physical Exam  Nursing note and vitals reviewed. Constitutional: She appears well-developed and well-nourished. No distress.  HENT:  Head: Normocephalic and atraumatic.  Mouth/Throat: No oropharyngeal exudate.  Eyes: EOM are normal. Pupils are equal, round, and reactive to light. Right eye exhibits no discharge. Left eye exhibits no discharge.  Neck: Normal range of motion. Neck supple. No JVD present.  Cardiovascular: Normal rate, regular rhythm and normal heart sounds.   Pulmonary/Chest: No stridor. No respiratory distress. She has no wheezes. She has rales (bibasilar). She exhibits no tenderness.       Tachypnea  Abdominal: Soft. Bowel sounds are normal. She exhibits no distension. There is no  tenderness. There is no guarding.  Musculoskeletal: Normal range of motion. She exhibits no edema and no tenderness.  Neurological:       gcs 4-2-6. Able to follow simple commands. When asked to squeeze to assess grip strength she was able to minimally move her arm. Also able to minimally move her lower extremities on command.  Skin: Skin is warm and  dry. No rash noted. She is not diaphoretic.    ED Course  Procedures (including critical care time)  Labs Reviewed  CBC WITH DIFFERENTIAL - Abnormal; Notable for the following:    WBC 10.8 (*)     RBC 3.67 (*)     Hemoglobin 9.5 (*)     HCT 33.3 (*)     MCH 25.9 (*)     MCHC 28.5 (*)     RDW 20.0 (*)     Neutrophils Relative 79 (*)     Neutro Abs 8.5 (*)     Lymphocytes Relative 8 (*)     All other components within normal limits  COMPREHENSIVE METABOLIC PANEL - Abnormal; Notable for the following:    Potassium 6.6 (*)     BUN 60 (*)     Creatinine, Ser 3.47 (*)     Albumin 2.3 (*)     AST 38 (*)     Alkaline Phosphatase 142 (*)     GFR calc non Af Amer 11 (*)     GFR calc Af Amer 13 (*)     All other components within normal limits  URINALYSIS, ROUTINE W REFLEX MICROSCOPIC - Abnormal; Notable for the following:    APPearance CLOUDY (*)     All other components within normal limits  AMMONIA - Abnormal; Notable for the following:    Ammonia 100 (*)     All other components within normal limits  LACTIC ACID, PLASMA  CULTURE, BLOOD (ROUTINE X 2)  CULTURE, BLOOD (ROUTINE X 2)  URINE CULTURE  BLOOD GAS, ARTERIAL   Ct Head Wo Contrast  02/12/2012  *RADIOLOGY REPORT*  Clinical Data: Altered mental status, code stroke  CT HEAD WITHOUT CONTRAST  Technique:  Contiguous axial images were obtained from the base of the skull through the vertex without contrast.  Comparison: 01/22/2012, 01/23/2012  Findings: Stable mild brain atrophy.  No acute intracranial hemorrhage, acute infarction, mass lesion, midline shift, herniation, hydrocephalus, or extra-axial fluid collection.  Gray- white matter differentiation maintained.  Cisterns patent.  No cerebellar abnormality.  Mastoid sinuses clear.  IMPRESSION: No acute intracranial finding.  Critical Value/emergent results were called by telephone at the time of interpretation on 02/01/2012 at 5:45 p.m. to Dr.Knapp, who verbally acknowledged  these results.   Original Report Authenticated By: Judie Petit. Miles Costain, M.D.    Dg Chest Port 1 View  02/15/2012  *RADIOLOGY REPORT*  Clinical Data: Weakness history of dementia  PORTABLE CHEST - 1 VIEW  Comparison: 01/24/2012  Findings: Heart size is normal.  Lung volumes are low.  Bilateral airspace opacities are noted right greater than left.  No effusions identified.  IMPRESSION:  1.  Bilateral airspace opacities which may represent edema or multifocal infection.   Original Report Authenticated By: Signa Kell, M.D.      1. HCAP (healthcare-associated pneumonia)   2. Hepatic encephalopathy   3. Altered mental status      Date: 01/27/2012  Rate: 66  Rhythm: normal sinus rhythm  QRS Axis: normal  Intervals: normal  ST/T Wave abnormalities: normal  Conduction Disutrbances: none  Narrative Interpretation: nml,  QW inf leads  Old EKG Reviewed: No significant changes noted, old Q waves   MDM  5:26 PM code stroke, in CT. Awaiting return.  5:54 PM code canceled since the patient is not a TPA candidate and she has no focal neurological findings. Her presentation is more consistent with altered metal status that is undifferentiated at this point. Will get labs including CBC, CMP, ammonia, blood and urine cultures. CT head without any acute intracranial findings. She does have liver disease and may have hepatic encephalopathy. She was recently hospitalized and was in a nursing home and is at risk for a health care prior infection. She is getting Rocephin for a UTI currently. Will recheck her urine. Chest x-ray to evaluate for pneumonia. She does have a new oxygen requirement of 2 L nasal cannula, she is currently 98%. She is mildly tachypneic. She is able to follow very simple commands. I observed her moving each individual extremity on command so she is able to follow commands somewhat. She continues to [employer] incomprehensible sounds. gcs currently 4-2-6.  8:06 PM tx for HCAP. This plus her hepatic  encephalopathy are likely contributing to her altered mental status. She also has new-onset kidney failure with hyperkalemia without any EKG changes she was given insulin and dextrose and Kayexalate to temporize. Will admit to medicine step down.  Admitted in stable condition     Warrick Parisian, MD 03-07-12 2311

## 2012-02-08 DIAGNOSIS — R609 Edema, unspecified: Secondary | ICD-10-CM

## 2012-02-08 DIAGNOSIS — R4182 Altered mental status, unspecified: Secondary | ICD-10-CM

## 2012-02-08 LAB — GLUCOSE, CAPILLARY
Glucose-Capillary: 125 mg/dL — ABNORMAL HIGH (ref 70–99)
Glucose-Capillary: 82 mg/dL (ref 70–99)
Glucose-Capillary: 68 mg/dL — ABNORMAL LOW (ref 70–99)

## 2012-02-08 LAB — BASIC METABOLIC PANEL
BUN: 60 mg/dL — ABNORMAL HIGH (ref 6–23)
Chloride: 105 mEq/L (ref 96–112)
Glucose, Bld: 89 mg/dL (ref 70–99)
Potassium: 6.1 mEq/L — ABNORMAL HIGH (ref 3.5–5.1)

## 2012-02-08 LAB — SODIUM, URINE, RANDOM: Sodium, Ur: 24 mEq/L

## 2012-02-08 LAB — MRSA PCR SCREENING: MRSA by PCR: NEGATIVE

## 2012-02-08 LAB — CBC WITH DIFFERENTIAL/PLATELET
Eosinophils Absolute: 0.2 10*3/uL (ref 0.0–0.7)
Eosinophils Relative: 2 % (ref 0–5)
Hemoglobin: 9.4 g/dL — ABNORMAL LOW (ref 12.0–15.0)
Lymphocytes Relative: 7 % — ABNORMAL LOW (ref 12–46)
Lymphs Abs: 0.7 10*3/uL (ref 0.7–4.0)
MCH: 25.8 pg — ABNORMAL LOW (ref 26.0–34.0)
MCV: 90.1 fL (ref 78.0–100.0)
Monocytes Relative: 7 % (ref 3–12)
Neutrophils Relative %: 85 % — ABNORMAL HIGH (ref 43–77)
RBC: 3.64 MIL/uL — ABNORMAL LOW (ref 3.87–5.11)

## 2012-02-08 MED ORDER — DIPHENHYDRAMINE HCL 50 MG/ML IJ SOLN
INTRAMUSCULAR | Status: AC
  Filled 2012-02-08: qty 1

## 2012-02-08 MED ORDER — LEVOTHYROXINE SODIUM 200 MCG PO TABS
200.0000 ug | ORAL_TABLET | Freq: Every day | ORAL | Status: DC
  Filled 2012-02-08 (×3): qty 1

## 2012-02-08 MED ORDER — LATANOPROST 0.005 % OP SOLN
1.0000 [drp] | Freq: Every day | OPHTHALMIC | Status: DC
  Administered 2012-02-08 – 2012-02-10 (×3): 1 [drp] via OPHTHALMIC
  Filled 2012-02-08: qty 2.5

## 2012-02-08 MED ORDER — LEVOFLOXACIN IN D5W 500 MG/100ML IV SOLN
500.0000 mg | INTRAVENOUS | Status: DC
  Administered 2012-02-10: 500 mg via INTRAVENOUS
  Filled 2012-02-08: qty 100

## 2012-02-08 MED ORDER — HALOPERIDOL LACTATE 5 MG/ML IJ SOLN
2.0000 mg | Freq: Four times a day (QID) | INTRAMUSCULAR | Status: DC | PRN
  Administered 2012-02-08 – 2012-02-09 (×3): 5 mg via INTRAVENOUS
  Filled 2012-02-08 (×2): qty 1

## 2012-02-08 MED ORDER — MORPHINE SULFATE 4 MG/ML IJ SOLN
4.0000 mg | INTRAMUSCULAR | Status: DC | PRN
  Administered 2012-02-09 (×4): 4 mg via INTRAVENOUS
  Filled 2012-02-08 (×4): qty 1

## 2012-02-08 MED ORDER — SODIUM CHLORIDE 0.9 % IV SOLN
INTRAVENOUS | Status: AC
  Administered 2012-02-08: 100 mL/h via INTRAVENOUS
  Administered 2012-02-08: via INTRAVENOUS

## 2012-02-08 MED ORDER — DORZOLAMIDE HCL-TIMOLOL MAL 2-0.5 % OP SOLN
1.0000 [drp] | Freq: Two times a day (BID) | OPHTHALMIC | Status: DC
  Administered 2012-02-08 – 2012-02-10 (×5): 1 [drp] via OPHTHALMIC
  Filled 2012-02-08: qty 10

## 2012-02-08 MED ORDER — FENTANYL CITRATE 0.05 MG/ML IJ SOLN
INTRAMUSCULAR | Status: AC
  Filled 2012-02-08: qty 2

## 2012-02-08 MED ORDER — MORPHINE SULFATE 2 MG/ML IJ SOLN
2.0000 mg | INTRAMUSCULAR | Status: DC | PRN
  Administered 2012-02-08 (×2): 2 mg via INTRAVENOUS
  Filled 2012-02-08 (×2): qty 1

## 2012-02-08 MED ORDER — LEVOFLOXACIN IN D5W 750 MG/150ML IV SOLN
750.0000 mg | Freq: Once | INTRAVENOUS | Status: AC
  Administered 2012-02-08: 750 mg via INTRAVENOUS
  Filled 2012-02-08: qty 150

## 2012-02-08 MED ORDER — INSULIN ASPART 100 UNIT/ML ~~LOC~~ SOLN
10.0000 [IU] | Freq: Once | SUBCUTANEOUS | Status: AC
  Administered 2012-02-08: 10 [IU] via INTRAVENOUS

## 2012-02-08 MED ORDER — INSULIN GLARGINE 100 UNIT/ML ~~LOC~~ SOLN
20.0000 [IU] | Freq: Every day | SUBCUTANEOUS | Status: DC
  Administered 2012-02-09: 20 [IU] via SUBCUTANEOUS

## 2012-02-08 MED ORDER — DEXTROSE 5 % IV SOLN
500.0000 mg | Freq: Once | INTRAVENOUS | Status: AC
  Administered 2012-02-08: 500 mg via INTRAVENOUS
  Filled 2012-02-08: qty 0.5

## 2012-02-08 MED ORDER — SIMVASTATIN 40 MG PO TABS
40.0000 mg | ORAL_TABLET | Freq: Every evening | ORAL | Status: DC
  Filled 2012-02-08: qty 1

## 2012-02-08 MED ORDER — VANCOMYCIN HCL IN DEXTROSE 1-5 GM/200ML-% IV SOLN
1000.0000 mg | INTRAVENOUS | Status: DC
  Administered 2012-02-10: 1000 mg via INTRAVENOUS
  Filled 2012-02-08: qty 200

## 2012-02-08 MED ORDER — SODIUM POLYSTYRENE SULFONATE 15 GM/60ML PO SUSP
60.0000 g | ORAL | Status: AC
  Administered 2012-02-08: 60 g via RECTAL
  Filled 2012-02-08: qty 240

## 2012-02-08 MED ORDER — SODIUM CHLORIDE 0.9 % IV SOLN
INTRAVENOUS | Status: DC
  Administered 2012-02-08: 10 mL/h via INTRAVENOUS

## 2012-02-08 MED ORDER — HALOPERIDOL LACTATE 5 MG/ML IJ SOLN
INTRAMUSCULAR | Status: AC
  Filled 2012-02-08: qty 1

## 2012-02-08 MED ORDER — SENNOSIDES-DOCUSATE SODIUM 8.6-50 MG PO TABS
1.0000 | ORAL_TABLET | Freq: Every day | ORAL | Status: DC
  Filled 2012-02-08 (×2): qty 1

## 2012-02-08 MED ORDER — DIPHENHYDRAMINE HCL 50 MG/ML IJ SOLN
25.0000 mg | Freq: Once | INTRAMUSCULAR | Status: AC
  Administered 2012-02-08: 25 mg via INTRAVENOUS

## 2012-02-08 MED ORDER — FUROSEMIDE 10 MG/ML IJ SOLN
40.0000 mg | Freq: Once | INTRAMUSCULAR | Status: AC
  Administered 2012-02-08: 40 mg via INTRAVENOUS
  Filled 2012-02-08: qty 4

## 2012-02-08 MED ORDER — LACTULOSE 10 GM/15ML PO SOLN
30.0000 g | Freq: Four times a day (QID) | ORAL | Status: DC
  Administered 2012-02-08 (×2): 30 g via ORAL
  Filled 2012-02-08 (×10): qty 45

## 2012-02-08 MED ORDER — FENTANYL CITRATE 0.05 MG/ML IJ SOLN: 12.5000 ug | INTRAMUSCULAR | Status: DC | PRN

## 2012-02-08 MED ORDER — SODIUM CHLORIDE 0.9 % IJ SOLN
3.0000 mL | Freq: Two times a day (BID) | INTRAMUSCULAR | Status: DC
  Administered 2012-02-08 – 2012-02-09 (×3): 3 mL via INTRAVENOUS

## 2012-02-08 MED ORDER — FENTANYL CITRATE 0.05 MG/ML IJ SOLN
12.5000 ug | INTRAMUSCULAR | Status: DC | PRN
  Administered 2012-02-08 (×2): 12.5 ug via INTRAVENOUS
  Filled 2012-02-08: qty 2

## 2012-02-08 MED ORDER — SODIUM CHLORIDE 0.9 % IV SOLN
1.0000 g | Freq: Once | INTRAVENOUS | Status: AC
  Administered 2012-02-08: 1 g via INTRAVENOUS
  Filled 2012-02-08: qty 10

## 2012-02-08 MED ORDER — INSULIN ASPART 100 UNIT/ML ~~LOC~~ SOLN
0.0000 [IU] | Freq: Three times a day (TID) | SUBCUTANEOUS | Status: DC
  Administered 2012-02-08: 1 [IU] via SUBCUTANEOUS

## 2012-02-08 MED ORDER — DEXTROSE 50 % IV SOLN
INTRAVENOUS | Status: AC
  Filled 2012-02-08: qty 50

## 2012-02-08 MED ORDER — SODIUM CHLORIDE 0.9 % IV BOLUS (SEPSIS)
250.0000 mL | Freq: Once | INTRAVENOUS | Status: AC
  Administered 2012-02-08: via INTRAVENOUS

## 2012-02-08 MED ORDER — ACETAMINOPHEN 325 MG PO TABS: 650.0000 mg | ORAL_TABLET | Freq: Four times a day (QID) | ORAL | Status: DC | PRN

## 2012-02-08 MED ORDER — INSULIN GLARGINE 100 UNIT/ML ~~LOC~~ SOLN: 35.0000 [IU] | Freq: Every day | SUBCUTANEOUS | Status: DC

## 2012-02-08 MED ORDER — ONDANSETRON HCL 4 MG PO TABS: 4.0000 mg | ORAL_TABLET | Freq: Four times a day (QID) | ORAL | Status: DC | PRN

## 2012-02-08 MED ORDER — ACETAMINOPHEN 650 MG RE SUPP: 650.0000 mg | Freq: Four times a day (QID) | RECTAL | Status: DC | PRN

## 2012-02-08 MED ORDER — ONDANSETRON HCL 4 MG/2ML IJ SOLN: 4.0000 mg | Freq: Four times a day (QID) | INTRAMUSCULAR | Status: DC | PRN

## 2012-02-08 MED ORDER — DEXTROSE 50 % IV SOLN
1.0000 | Freq: Once | INTRAVENOUS | Status: AC
  Administered 2012-02-08: 50 mL via INTRAVENOUS
  Filled 2012-02-08: qty 50

## 2012-02-08 MED ORDER — LORAZEPAM 2 MG/ML IJ SOLN
0.5000 mg | Freq: Once | INTRAMUSCULAR | Status: AC
  Administered 2012-02-08: 0.5 mg via INTRAVENOUS
  Filled 2012-02-08: qty 1

## 2012-02-08 MED ORDER — METOPROLOL TARTRATE 12.5 MG HALF TABLET
12.5000 mg | ORAL_TABLET | Freq: Two times a day (BID) | ORAL | Status: DC
  Filled 2012-02-08 (×4): qty 1

## 2012-02-08 MED ORDER — ONDANSETRON HCL 4 MG/2ML IJ SOLN: 4.0000 mg | Freq: Three times a day (TID) | INTRAMUSCULAR | Status: DC | PRN

## 2012-02-08 MED ORDER — DEXTROSE 5 % IV SOLN
500.0000 mg | INTRAVENOUS | Status: DC
  Administered 2012-02-08: 500 mg via INTRAVENOUS
  Filled 2012-02-08 (×2): qty 0.5

## 2012-02-08 NOTE — Clinical Social Work Psychosocial (Signed)
     Clinical Social Work Department BRIEF PSYCHOSOCIAL ASSESSMENT 02/08/2012  Patient:  Shannon Charles, Shannon Charles     Account Number:  [id number]     Admit date:  02/20/12  Clinical Social Worker:  Lourdes Sledge  Date/Time:  02/08/2012 03:16 PM  Referred by:  RN  Date Referred:  02/08/2012 Referred for  SNF Placement   Other Referral:   Interview type:  Other - See comment Other interview type:   CSW reviewed progress notes and spoke to [place] at [facility].    PSYCHOSOCIAL DATA Living Status:  FACILITY Admitted from facility:  [facility] Level of care:  Skilled Nursing Facility Primary support name:  Gracelee Stemmler Primary support relationship to patient:  SPOUSE Degree of support available:   Pt spouse appears to be actively involved in pt care.    CURRENT CONCERNS Current Concerns  Post-Acute Placement   Other Concerns:    SOCIAL WORK ASSESSMENT / PLAN CSW informed that pt admitted from [place].    CSW unable to speak to pt who presents AMS and no family present in pt room. CSW left a message for pt spouse to confirm dc plans.    CSW spoke to [place] at [facility] who confirmed that pt is a resident of the facility and able to accept pt when she is medically stable.   Assessment/plan status:  Psychosocial Support/Ongoing Assessment of Needs Other assessment/ plan:   Information/referral to community resources:   Pt admitted from facility and currently not in need of additional resources.    PATIENTS/FAMILYS RESPONSE TO PLAN OF CARE: Pt presents with AMS and family not present in pt room. CSW will await a call back from pt spouse who is main contact.    CSW did speak to [facility] who confirmed pt can return at dc.    CSW will facilitate with dc planning pending pt spouse agrees for pt to return to facility.

## 2012-02-08 NOTE — Progress Notes (Signed)
[facility] Progress Note [place] TEAM 1 - Stepdown/ICU TEAM   Shannon Charles ZOX:[id number] DOB: Jan 22, 1928 DOA: 02/20/2012 PCP: Romero Belling, MD  Brief narrative: 78 year old female SNF resident who was just recently discharged after being worked up for TIA and treated for UTI and had T12 compression fracture with history of cirrhosis secondary to [name] was found to be confused at around 4 PM today and prior to that was normal. Patient was initially brought to the ER as a code stroke but the patient did not have any focal deficits and CT was negative code stroke was canceled. Subsequent labs showed increased ammonia levels and chest x-ray was showing infiltrates compatible with pneumonia. In addition patient's creatinine has increased to 3 from normal last. Patient has been admitted for encephalopathy probably secondary to hepatic and metabolic reasons with pneumonia and renal failure. Patient at this time is alert and is oriented to name and follows commands but looks irritable. As per family patient did not have any nausea vomiting chest pain or any diarrhea. Patient did have mild shortness of breath.  Assessment/Plan:  Haptic encephalopathy CT head negative for acute findings - cont lactulose  Healthcare acquired Pneumonia vs/ edema Cont empiric antibiotics - low dose diuretic  Acute renal failure follow trend - urine sodium is not c/w hepatorenal syndrome - give trial of diuresis  hyperkalemia Improving w/ kayexalate - follow  Cirrhosis due to NASH Cont lactulose  Alzheimer's dementia Husband reports tht pt has signif cognitive deficits at baseline  T12 compression fracture  DM Reasonably controlled at present - follow trend  HTN Well controlled at present   Hypothyroid TSH modestly elevated - no change in synthroid at this time  Code Status: DNR/NO CODE BLUE Family Communication: spoke w/ husband at bedside Disposition Plan:  SDU  Consultants: none  Procedures: none  Antibiotics: maxepime  11/15 >> levaquin  11/15 >> vanc  11/15 >>  DVT prophylaxis: SCDs  HPI/Subjective: Pt is yelling out.  She is delirious.  She is not able to provide a reliable hx.  Her husband is at the bedside.     Objective: Blood pressure 117/47, pulse 87, temperature 98.6 F (37 C), temperature source Oral, resp. rate 25, height 5' 1.81" (1.57 m), weight 94.9 kg (209 lb 3.5 oz), SpO2 98.00%.  Intake/Output Summary (Last 24 hours) at 02/08/12 1303 Last data filed at 02/08/12 1228  Gross per 24 hour  Intake   1103 ml  Output    300 ml  Net    803 ml     Exam: General: No acute respiratory distress - very agitated and delirious Lungs: diffuse crackles - no wheeze  Cardiovascular: tachycardic but regular - no appreciable G or R Abdomen: obese, distended, soft, no appreciable mass, no rebound Extremities: anasarca rated at 2+  Data Reviewed: Basic Metabolic Panel:  Lab 02/08/12 8119 01/27/2012 1745  NA 139 142  K 6.1* 6.6*  CL 105 107  CO2 26 29  GLUCOSE 89 82  BUN 60* 60*  CREATININE 3.44* 3.47*  CALCIUM 8.5 8.7  MG -- --  PHOS -- --   Liver Function Tests:  Lab 02/03/2012 1745  AST 38*  ALT 13  ALKPHOS 142*  BILITOT 0.4  PROT 6.7  ALBUMIN 2.3*    Lab 02/08/12 0200 02/05/2012 1836  AMMONIA RESULTS UNAVAILABLE DUE TO INTERFERING SUBSTANCE 100*   CBC:  Lab 02/08/12 0200 01/26/2012 1745  WBC 9.9 10.8*  NEUTROABS 8.4* 8.5*  HGB 9.4* 9.5*  HCT  32.8* 33.3*  MCV 90.1 90.7  PLT 351 368   CBG:  Lab 02/08/12 1205 02/08/12 0757 02/08/12 0013  GLUCAP 82 125* 71    Recent Results (from the past 240 hour(s))  MRSA PCR SCREENING     Status: Normal   Collection Time   02/08/12 12:01 AM      Component Value Range Status Comment   MRSA by PCR NEGATIVE  NEGATIVE Final      Studies:  Recent x-ray studies have been reviewed in detail by the Attending Physician  Scheduled Meds:  Reviewed in detail by  the Attending Physician   Lonia Blood, MD [facility] Office  [phone] Pager [phone]  On-Call/Text Page:      [email or web address]      password [email or web address]  If 7PM-7AM, please contact night-coverage [email or web address] Password [email or web address] 02/08/2012, 1:03 PM   LOS: 1 day

## 2012-02-08 NOTE — Progress Notes (Addendum)
cbg 68, dextrose 50 % half amp given given, patient lethargic, continue to watch.

## 2012-02-08 NOTE — Progress Notes (Signed)
Patient is currently active with long-term disease management services with [facility] as benefit of her [facility] insurance. Spoke with husband to make aware [facility] Care Management following. Patient was admitted from [facility]. Plan is likely to return there upon discharge. Left contact information. Appreciative of visit.   Raiford Noble, RN,BSN, MSN  [facility] Liaison  [phone]

## 2012-02-08 NOTE — Progress Notes (Addendum)
Pt's BP 90/34 HR 87  RR 20 SpO2 95% 3L [place]. UOP has been 100 cc in last 4 hours. Claiborne Billings, NP with [facility], notified. New orders received for 250 cc NS bolus. Will begin bolus and continue to monitor.  [place] Jesiel Garate, RN

## 2012-02-08 NOTE — Progress Notes (Signed)
CBG now 108.  Continue to watch

## 2012-02-08 NOTE — Progress Notes (Signed)
ANTIBIOTIC CONSULT NOTE - INITIAL  Pharmacy Consult for levofloxacin, cefepime, vancomycin  Indication: rule out pneumonia  No Known Allergies  Patient Measurements: Height: 5' 1.81" (157 cm) Weight: 202 lb 9.6 oz (91.9 kg) (Per 01/31/12 documentation) IBW/kg (Calculated) : 49.67   Vital Signs: Temp: 98.4 F (36.9 C) (11/14 1743) BP: 103/52 mmHg (11/14 2226) Pulse Rate: 70  (11/14 2226) Intake/Output from previous day:   Intake/Output from this shift:    Labs:  Basename 2012/03/08 1745  WBC 10.8*  HGB 9.5*  PLT 368  LABCREA --  CREATININE 3.47*   Estimated Creatinine Clearance: 13.1 ml/min (by C-G formula based on Cr of 3.47). No results found for this basename: VANCOTROUGH:2,VANCOPEAK:2,VANCORANDOM:2,GENTTROUGH:2,GENTPEAK:2,GENTRANDOM:2,TOBRATROUGH:2,TOBRAPEAK:2,TOBRARND:2,AMIKACINPEAK:2,AMIKACINTROU:2,AMIKACIN:2, in the last 72 hours   Microbiology: Recent Results (from the past 720 hour(s))  URINE CULTURE     Status: Normal   Collection Time   01/22/12  4:32 PM      Component Value Range Status Comment   Specimen Description URINE, CATHETERIZED   Final    Special Requests NONE   Final    Culture  Setup Time 01/22/2012 19:13   Final    Colony Count >=100,000 COLONIES/ML   Final    Culture ESCHERICHIA COLI   Final    Report Status 01/24/2012 FINAL   Final    Organism ID, Bacteria ESCHERICHIA COLI   Final   URINE CULTURE     Status: Normal   Collection Time   01/28/12  2:00 AM      Component Value Range Status Comment   Specimen Description URINE, CLEAN CATCH   Final    Special Requests levaquin, rifaximin Normal   Final    Culture  Setup Time 01/28/2012 03:05   Final    Colony Count NO GROWTH   Final    Culture NO GROWTH   Final    Report Status 01/29/2012 FINAL   Final   SURGICAL PCR SCREEN     Status: Normal   Collection Time   01/28/12  5:23 AM      Component Value Range Status Comment   MRSA, PCR NEGATIVE  NEGATIVE Final    Staphylococcus aureus  NEGATIVE  NEGATIVE Final     Medical History: Past Medical History  Diagnosis Date  . DEMENTIA 08/23/2006  . DEPRESSION 08/23/2006  . DIABETES MELLITUS, TYPE I 08/23/2006  . HYPERLIPIDEMIA 08/23/2006  . HYPERTENSION 08/23/2006  . HYPOTHYROIDISM 08/23/2006  . OSTEOARTHRITIS 08/23/2006  . RENAL INSUFFICIENCY 08/23/2006  . Personal history of colonic polyps 04/29/2007  . Lung disease, restrictive     Mild, by pulmonary function test in October 2006 with mildly reduce DLCO  . Pain in limb   . Chronic pain syndrome   . Lack of coordination   . Lumbago   . Primary localized osteoarthrosis, lower leg   . Disorders of bursae and tendons in shoulder region, unspecified   . Bell's palsy     per Husband  . Cirrhosis     Medications:  Scheduled:    . [COMPLETED] dextrose  1 ampule Intravenous Once  . dorzolamide-timolol  1 drop Both Eyes BID  . [COMPLETED] fentaNYL      . insulin aspart  0-9 Units Subcutaneous TID WC  . [COMPLETED] insulin aspart  10 Units Intravenous Once  . insulin glargine  35 Units Subcutaneous Daily  . [COMPLETED] lactulose  20 g Oral Once  . lactulose  30 g Oral Q6H  . latanoprost  1 drop Both Eyes QHS  . levothyroxine  200 mcg Oral QAC breakfast  . metoprolol tartrate  12.5 mg Oral BID  . [COMPLETED] piperacillin-tazobactam (ZOSYN)  IV  3.375 g Intravenous Once  . senna-docusate  1 tablet Oral QHS  . simvastatin  40 mg Oral QPM  . [COMPLETED] sodium chloride  1,000 mL Intravenous Once  . sodium chloride  3 mL Intravenous Q12H  . [COMPLETED] sodium polystyrene  30 g Oral Once  . [COMPLETED] vancomycin  1,500 mg Intravenous Once   Assessment: 78 yo female with recent hospitalization (10/29-11/8) presented with altered mental status. Pharmacy consulted to manage vancomycin, cefepime and levofloxacin for possible pneumonia. Patient has already received 1.5gm IV vancomycin x 1. Of note, SrCr (3.47 mg/dL) is increased compared to previous admit (~ 1.1 mg/dL).  Goal of  Therapy:  Vancomycin trough level 15-20 mcg/ml  Plan:  1. Levofloxacin 750mg  IV x 1, then 500mg  IV Q48H. 2. Cefepime 500mg  IV Q24H. 3. Follow-up renal function trend for further vancomycin dosing.   Shannon Charles 02/08/2012,12:19 AM

## 2012-02-08 NOTE — Progress Notes (Signed)
Utilization review completed.  

## 2012-02-08 NOTE — Progress Notes (Signed)
Patient unable to take po meds.  DR. Sharon Seller notified and to hold Lactose for now.

## 2012-02-09 ENCOUNTER — Inpatient Hospital Stay (HOSPITAL_COMMUNITY): Payer: Medicare Other

## 2012-02-09 DIAGNOSIS — N179 Acute kidney failure, unspecified: Secondary | ICD-10-CM

## 2012-02-09 DIAGNOSIS — J189 Pneumonia, unspecified organism: Secondary | ICD-10-CM

## 2012-02-09 DIAGNOSIS — G9340 Encephalopathy, unspecified: Secondary | ICD-10-CM

## 2012-02-09 DIAGNOSIS — R9431 Abnormal electrocardiogram [ECG] [EKG]: Secondary | ICD-10-CM

## 2012-02-09 LAB — CBC
HCT: 28.8 % — ABNORMAL LOW (ref 36.0–46.0)
HCT: 30.2 % — ABNORMAL LOW (ref 36.0–46.0)
Hemoglobin: 8.1 g/dL — ABNORMAL LOW (ref 12.0–15.0)
Hemoglobin: 8.4 g/dL — ABNORMAL LOW (ref 12.0–15.0)
MCH: 25.6 pg — ABNORMAL LOW (ref 26.0–34.0)
MCH: 26.1 pg (ref 26.0–34.0)
MCHC: 28.1 g/dL — ABNORMAL LOW (ref 30.0–36.0)
MCHC: 27.8 g/dL — ABNORMAL LOW (ref 30.0–36.0)
MCV: 91.1 fL (ref 78.0–100.0)
MCV: 93.8 fL (ref 78.0–100.0)
Platelets: 318 10*3/uL (ref 150–400)
Platelets: 390 10*3/uL (ref 150–400)
RBC: 3.16 MIL/uL — ABNORMAL LOW (ref 3.87–5.11)
RBC: 3.22 MIL/uL — ABNORMAL LOW (ref 3.87–5.11)
RDW: 20.4 % — ABNORMAL HIGH (ref 11.5–15.5)
RDW: 20.3 % — ABNORMAL HIGH (ref 11.5–15.5)
WBC: 11 10*3/uL — ABNORMAL HIGH (ref 4.0–10.5)
WBC: 14.7 10*3/uL — ABNORMAL HIGH (ref 4.0–10.5)

## 2012-02-09 LAB — GLUCOSE, CAPILLARY
Glucose-Capillary: 112 mg/dL — ABNORMAL HIGH (ref 70–99)
Glucose-Capillary: 112 mg/dL — ABNORMAL HIGH (ref 70–99)

## 2012-02-09 LAB — URINE CULTURE
Colony Count: NO GROWTH
Culture: NO GROWTH

## 2012-02-09 LAB — PRO B NATRIURETIC PEPTIDE: Pro B Natriuretic peptide (BNP): 705.9 pg/mL — ABNORMAL HIGH (ref 0–450)

## 2012-02-09 LAB — BLOOD GAS, ARTERIAL
Bicarbonate: 25.2 mEq/L — ABNORMAL HIGH (ref 20.0–24.0)
FIO2: 1 %
O2 Saturation: 98.4 %
Patient temperature: 98.6
pH, Arterial: 7.19 — CL (ref 7.350–7.450)

## 2012-02-09 LAB — COMPREHENSIVE METABOLIC PANEL
ALT: 10 U/L (ref 0–35)
AST: 28 U/L (ref 0–37)
Albumin: 2.2 g/dL — ABNORMAL LOW (ref 3.5–5.2)
Albumin: 2.2 g/dL — ABNORMAL LOW (ref 3.5–5.2)
Alkaline Phosphatase: 119 U/L — ABNORMAL HIGH (ref 39–117)
Alkaline Phosphatase: 117 U/L (ref 39–117)
BUN: 62 mg/dL — ABNORMAL HIGH (ref 6–23)
BUN: 68 mg/dL — ABNORMAL HIGH (ref 6–23)
CO2: 27 mEq/L (ref 19–32)
CO2: 26 mEq/L (ref 19–32)
Calcium: 8.4 mg/dL (ref 8.4–10.5)
Chloride: 112 mEq/L (ref 96–112)
Chloride: 112 mEq/L (ref 96–112)
Creatinine, Ser: 3.88 mg/dL — ABNORMAL HIGH (ref 0.50–1.10)
Creatinine, Ser: 4.48 mg/dL — ABNORMAL HIGH (ref 0.50–1.10)
GFR calc Af Amer: 11 mL/min — ABNORMAL LOW (ref >90)
GFR calc Af Amer: 10 mL/min — ABNORMAL LOW (ref >90)
GFR calc non Af Amer: 10 mL/min — ABNORMAL LOW (ref >90)
GFR calc non Af Amer: 8 mL/min — ABNORMAL LOW (ref >90)
Glucose, Bld: 112 mg/dL — ABNORMAL HIGH (ref 70–99)
Glucose, Bld: 117 mg/dL — ABNORMAL HIGH (ref 70–99)
Potassium: 5 mEq/L (ref 3.5–5.1)
Potassium: 5.5 mEq/L — ABNORMAL HIGH (ref 3.5–5.1)
Sodium: 147 mEq/L — ABNORMAL HIGH (ref 135–145)
Total Bilirubin: 0.4 mg/dL (ref 0.3–1.2)
Total Bilirubin: 0.3 mg/dL (ref 0.3–1.2)
Total Protein: 5.9 g/dL — ABNORMAL LOW (ref 6.0–8.3)

## 2012-02-09 LAB — DIC (DISSEMINATED INTRAVASCULAR COAGULATION)PANEL
Smear Review: NONE SEEN
aPTT: 43 seconds — ABNORMAL HIGH (ref 24–37)

## 2012-02-09 LAB — AMMONIA: Ammonia: 51 umol/L (ref 11–60)

## 2012-02-09 LAB — PROCALCITONIN: Procalcitonin: 0.45 ng/mL

## 2012-02-09 LAB — LACTIC ACID, PLASMA: Lactic Acid, Venous: 1.1 mmol/L (ref 0.5–2.2)

## 2012-02-09 MED ORDER — MORPHINE SULFATE 2 MG/ML IJ SOLN: 1.0000 mg | INTRAMUSCULAR | Status: DC | PRN

## 2012-02-09 MED ORDER — SODIUM CHLORIDE 0.9 % IV BOLUS (SEPSIS)
500.0000 mL | Freq: Once | INTRAVENOUS | Status: AC
  Administered 2012-02-09: 500 mL via INTRAVENOUS

## 2012-02-09 MED ORDER — MORPHINE SULFATE 10 MG/ML IJ SOLN
1.0000 mg/h | INTRAMUSCULAR | Status: DC
  Administered 2012-02-10: 1 mg/h via INTRAVENOUS
  Filled 2012-02-09 (×2): qty 10

## 2012-02-09 MED ORDER — PIPERACILLIN-TAZOBACTAM IN DEX 2-0.25 GM/50ML IV SOLN
2.2500 g | Freq: Three times a day (TID) | INTRAVENOUS | Status: DC
  Administered 2012-02-10 (×3): 2.25 g via INTRAVENOUS
  Filled 2012-02-09 (×5): qty 50

## 2012-02-09 MED ORDER — SCOPOLAMINE 1 MG/3DAYS TD PT72
1.0000 | MEDICATED_PATCH | TRANSDERMAL | Status: DC
  Administered 2012-02-10: 1.5 mg via TRANSDERMAL
  Filled 2012-02-09: qty 1

## 2012-02-09 NOTE — Progress Notes (Signed)
Pt continues to be unable to take PO meds due to declining mentation, requested from Dr. Izola Price if pt meds could be switched to IV. Stated she would assess.

## 2012-02-09 NOTE — Progress Notes (Signed)
Pt family members arrived to room episode of post respirator distress and began questions patients current code status of DNR. Had etensive discussion with family regarding details of code status, due to declining pt conditions and appearance of families lack of knowledge regarding code status. Family had discussion and requested to change pt code status from DNR to full code. Oscar La RN verified pt families request to change code status. Dr. Izola Price notified. Will continue to monitor.

## 2012-02-09 NOTE — Consult Note (Addendum)
Name: Shannon Charles MRN: [id number] DOB: [birth year]    LOS: 2  Referring Provider:  Sharon Seller Reason for Referral:  Respiratory failure  PULMONARY / CRITICAL CARE MEDICINE  HPI:    [id number] with PMHx s/f dementia and possible cirrhosis 2/2 NASH adm on 11/14 from SNF for possible stroke.    The patient's husband explains to me that liver disease was diagnosed approximately 6 months ago when she was admitted for a fall related to her Alzheimer's dementia. She had another fall approximately one week ago where she broke her T12 vertebra.    her dementia has been advancing to the point that she can no longer bathe herself, though she can feed herself. While in the nursing home, she apparently developed concerning facial twisting and was transferred to the emergency department.   She was confused but without focal neurologic deficits. Additionally, she had a negative CT head, so the code stroke was canceled. Chest x-ray showed infiltrates and BMP suggested renal failure (creatinine increased to 3 from normal). Her encephalopathy was attributed to hyperammonemia (ammonia = 100). She was placed on broad-spectrum antibiotics for HCAP.  Her renal failure worsened requiring Lasix for hyperkalemia.  We're consulted for worsening respiratory failure with an ambiguous CODE STATUS.      Past Medical History  Diagnosis Date  . DEMENTIA 08/23/2006  . DEPRESSION 08/23/2006  . DIABETES MELLITUS, TYPE I 08/23/2006  . HYPERLIPIDEMIA 08/23/2006  . HYPERTENSION 08/23/2006  . HYPOTHYROIDISM 08/23/2006  . OSTEOARTHRITIS 08/23/2006  . RENAL INSUFFICIENCY 08/23/2006  . Personal history of colonic polyps 04/29/2007  . Lung disease, restrictive     Mild, by pulmonary function test in October 2006 with mildly reduce DLCO  . Pain in limb   . Chronic pain syndrome   . Lack of coordination   . Lumbago   . Primary localized osteoarthrosis, lower leg   . Disorders of bursae and tendons in shoulder region, unspecified     . Bell's palsy     per Husband  . Cirrhosis    Past Surgical History  Procedure Date  . Total abdominal hysterectomy   . Colonoscopy w/ polypectomy    Prior to Admission medications   Medication Sig Start Date End Date Taking? Authorizing Provider  amitriptyline (ELAVIL) 25 MG tablet Take 25 mg by mouth at bedtime.   Yes Historical Provider, MD  aspirin 81 MG EC tablet Take 81 mg by mouth daily. Swallow whole. 02/01/12  Yes Meredeth Ide, MD  cefTRIAXone (ROCEPHIN) 1 G injection Inject 1 g into the muscle daily. 02/06/12 02/12/12 Yes Historical Provider, MD  donepezil (ARICEPT) 10 MG tablet Take 10 mg by mouth daily.     Yes Historical Provider, MD  dorzolamide-timolol (COSOPT) 22.3-6.8 MG/ML ophthalmic solution Place 1 drop into both eyes 2 (two) times daily.   Yes Historical Provider, MD  furosemide (LASIX) 40 MG tablet Take 40 mg by mouth daily. 12/05/11 12/04/12 Yes Etta Grandchild, MD  insulin aspart (NOVOLOG) 100 UNIT/ML injection Inject 0-9 Units into the skin 3 (three) times daily with meals. 02/01/12  Yes Meredeth Ide, MD  insulin glargine (LANTUS) 100 UNIT/ML injection Inject 35 Units into the skin daily. 02/01/12  Yes Meredeth Ide, MD  lactulose (CHRONULAC) 10 GM/15ML solution Take 20 g by mouth daily. 02/01/12  Yes Meredeth Ide, MD  latanoprost (XALATAN) 0.005 % ophthalmic solution Place 1 drop into both eyes at bedtime.   Yes Historical Provider, MD  levothyroxine (SYNTHROID, LEVOTHROID) 200 MCG tablet  Take 200 mcg by mouth daily.   Yes Historical Provider, MD  LORazepam (ATIVAN) 0.5 MG tablet Take 1 tablet (0.5 mg total) by mouth 2 (two) times daily as needed. For anxiety 07/20/11  Yes Shanker Levora Dredge, MD  losartan (COZAAR) 100 MG tablet Take 100 mg by mouth daily.   Yes Historical Provider, MD  magnesium hydroxide (MILK OF MAGNESIA) 400 MG/5ML suspension Take 30 mLs by mouth daily as needed. For constipation   Yes Historical Provider, MD  memantine (NAMENDA) 10 MG tablet Take 1  tablet (10 mg total) by mouth 2 (two) times daily. 01/15/12  Yes Romero Belling, MD  metoprolol tartrate (LOPRESSOR) 12.5 mg TABS Take 0.5 tablets (12.5 mg total) by mouth 2 (two) times daily. 02/01/12  Yes Meredeth Ide, MD  oxyCODONE (OXY IR/ROXICODONE) 5 MG immediate release tablet Take 1 tablet (5 mg total) by mouth every 8 (eight) hours as needed for pain. 02/01/12  Yes Meredeth Ide, MD  OxyCODONE (OXYCONTIN) 15 mg TB12 Take 15 mg by mouth every 12 (twelve) hours.   Yes Historical Provider, MD  potassium chloride SA (K-DUR,KLOR-CON) 20 MEQ tablet Take 20 mEq by mouth 3 (three) times daily.   Yes Historical Provider, MD  senna-docusate (SENOKOT-S) 8.6-50 MG per tablet Take 1 tablet by mouth at bedtime. 02/01/12  Yes Meredeth Ide, MD  sertraline (ZOLOFT) 50 MG tablet Take 100 mg by mouth daily.   Yes Historical Provider, MD  simvastatin (ZOCOR) 40 MG tablet Take 40 mg by mouth every evening.   Yes Historical Provider, MD  Vitamin D, Ergocalciferol, (DRISDOL) 50000 UNITS CAPS Take 50,000 Units by mouth every 7 (seven) days. Takes on Wednesday   Yes Historical Provider, MD   Allergies No Known Allergies  Family History Family History  Problem Relation Age of Onset  . Cancer Sister     uncertain type  . Cirrhosis Mother    Social History  reports that she has never smoked. She does not have any smokeless tobacco history on file. She reports that she does not drink alcohol or use illicit drugs.  Review Of Systems: Unobtainable  Brief patient description:  Multiorgan failure, dementia, pneumonia  Events Since Admission:   Current Status: DO NOT RESUSCITATE  Vital Signs: Temp:  [98.4 F (36.9 C)-101 F (38.3 C)] 98.4 F (36.9 C) (11/16 2008) Pulse Rate:  [86-96] 87  (11/16 2100) Resp:  [13-24] 16  (11/16 2100) BP: (89-118)/(30-92) 105/47 mmHg (11/16 2100) SpO2:  [91 %-96 %] 93 % (11/16 2100)  Physical Examination: General:   Overweight African American female obtunded; she has a  nonrebreather on Neuro:  Patient is obtunded. She briefly opens her eyes to voice, but does not track. She is nonverbal. She does not follow commands. HEENT:  Pupils equal round and constricted. No icterus Neck:   Supple, no lymphadenopathy Cardiovascular:   Regular rate and rhythm no murmurs rubs or gallops Lungs:   Decreased breath sounds bilaterally, no respiratory distress. Poor secretion clearance Abdomen:   Soft nontender nondistended. Anasarca Musculoskeletal:   Anasarca, 2+ pitting edema Skin:   No rash. Warm  Principal Problem:  *Encephalopathy acute Active Problems:  DIABETES MELLITUS, TYPE I  ARF (acute renal failure)  Healthcare-associated pneumonia   ASSESSMENT AND PLAN multiorgan failure.  - appears to be secondary to sepsis due to pneumonia, most likely related to advanced dementia (78 years old, recent TIA, 10/30 MRI showed microvascular ischemia and small chronic infarct in the right thalamus) with aspiration. -Suspected  pneumonia has triggered her acute renal failure (ATN); patient is profoundly hypotensive -Without vasopressor support or dialysis or ventilator support patient will likely succumb to her illness.  Patient's husband does not want to be this aggressive   CODE STATUS I spoke at length with the husband. I showed him his wife's rising creatinine level. I showed him his wife's chest x-ray. I explained to him that given her multiorgan failure and advancing dementia that her prognosis is poor. He told me that he would not want his wife placed on the ventilator. Earlier in the evening he declined placement of central venous access. He said he would prefer for his wife to die peacefully. I explained that this could be accomplished with comfort care. I also emphasized that chest compressions would not be consistent with comfort care. He asked me to explain this to his daughter in law as well as his granddaughter. I spoke with each of them individually on the phone  and explained this to them. After they discuss this amongst themselves, they chose comfort care.  I discussed this with the primary team. I will be comfort care orders up to their discretion. I would discontinue BiPAP as the patient is obtunded.    BEST PRACTICE / DISPOSITION Level of Care:  Time spent greater than 35 minutes Primary Service:  [facility]clung Consultants:  None Code Status:  As described above Diet:  Would make n.p.o. DVT Px:  Not indicated GI Px:  Not indicated Skin Integrity:  Intact Social / Family:  Discussed with patient's husband, daughter-in-law, granddaughter  Helen Hashimoto, M.D. Pulmonary and Critical Care Medicine [facility] Pager: [phone]  02/09/2012, 11:05 PM

## 2012-02-09 NOTE — Progress Notes (Addendum)
Patient ID: Shannon Charles, female   DOB: [birth year], 78 y.o.   MRN: [id number]  [facility] PROGRESS NOTE  GRABIELA WOHLFORD WUJ:[id number] DOB: 10/22/1927 DOA: 2012/03/01 PCP: Romero Belling, MD  Brief narrative: 78 year old female SNF resident who was just recently discharged after being worked up for TIA and treated for UTI and had T12 compression fracture with history of cirrhosis secondary to Shannon Charles was found to be confused at around 4 PM today and prior to that was normal. Patient was initially brought to the ER as a code stroke but the patient did not have any focal deficits and CT was negative code stroke was canceled. Subsequent labs showed increased ammonia levels and chest x-ray was showing infiltrates compatible with pneumonia. In addition patient's creatinine has increased to 3 from normal last. Patient has been admitted for encephalopathy probably secondary to hepatic and metabolic reasons with pneumonia and renal failure. Patient at this time is alert and is oriented to name and follows commands but looks irritable. As per family patient did not have any nausea vomiting chest pain or any diarrhea. Patient did have mild shortness of breath.   Assessment/Plan:  Haptic encephalopathy  - CT head negative for acute findings  Healthcare acquired Pneumonia vs/ edema  - respiratory distress this afternoon with hypoxia and unclear etiology - pt grossly edematous and hypotensive, bolus of NS given earlier due to hypotension - will hold of on IVF, consider Lasix again if BP permits - continue broad spectrum antibiotics as noted below - monitor vitals in SDU - keep oxygen via [place] Leukocytosis - with low grade fever, unclear etiology especially since pt is on broad spectrum antibiotics - will check lactic acid, repeat cmet, cbc, procalcitonin  Acute renal failure  - creatinine is trending up - will provide IVF as pt is also hypotensive - hold Lasix temporarily Hyperkalemia  - within normal  this AM - BMP in AM Cirrhosis due to NASH  - d/c Lactulose as pt is not able to take PO and is hypotensive Alzheimer's dementia  - Husband reports that pt has signif cognitive deficits at baseline  T12 compression fracture  - pain control as needed Hypernatremia - will provide IVF and will hold on Lasix today - BMP in AM Acute on chronic anemia - Hg drop since yesterday - will follow trend but no transfusion is indicated at this time - CBC in AM DM  - Reasonably controlled at present - follow trend  HTN  - hypotensive today - will hold Lasix and provide NS bolus Hypothyroid  - TSH modestly elevated - no change in synthroid at this time   Code Status: DNR/NO CODE BLUE  Family Communication: spoke w/ husband at bedside  Disposition Plan: SDU   Consultants:  none   Procedures:  Ct Head Wo Contrast March 01, 2012   IMPRESSION:  No acute intracranial finding.    Dg Chest Port 1 View Mar 01, 2012    IMPRESSION:   1.  Bilateral airspace opacities which may represent edema or multifocal infection.     Antibiotics:  maxepime 11/15 >>  levaquin 11/15 >>  vanc 11/15 >>   DVT prophylaxis:  SCD's  HPI/Subjective: No events overnight.   Objective: Filed Vitals:   02/09/12 1400 02/09/12 1458 02/09/12 1500 02/09/12 1600  BP: 99/35  98/34 115/43  Pulse: 90  89 96  Temp:  99 F (37.2 C)    TempSrc:  Oral  Oral  Resp: 22  20 20   Height:  Weight:      SpO2: 95%  95% 95%    Intake/Output Summary (Last 24 hours) at 02/09/12 1709 Last data filed at 02/09/12 1600  Gross per 24 hour  Intake    203 ml  Output    470 ml  Net   -267 ml    Exam:  General: No acute respiratory distress - lethargic and not following commands, opens eyes when called by name Lungs: diffuse crackles - no wheeze  Cardiovascular: tachycardic but regular - no appreciable G or R  Abdomen: obese, distended, soft, no appreciable mass, no rebound  Extremities: anasarca rated at 2+  Data  Reviewed: Basic Metabolic Panel:  Lab 02/09/12 4540 02/08/12 0200 02/15/2012 1745  NA 147* 139 142  K 5.0 6.1* 6.6*  CL 112 105 107  CO2 27 26 29   GLUCOSE 112* 89 82  BUN 62* 60* 60*  CREATININE 3.88* 3.44* 3.47*  CALCIUM 8.4 8.5 8.7  MG -- -- --  PHOS -- -- --   Liver Function Tests:  Lab 02/09/12 0116 01/28/2012 1745  AST 28 38*  ALT 10 13  ALKPHOS 119* 142*  BILITOT 0.4 0.4  PROT 5.9* 6.7  ALBUMIN 2.2* 2.3*    Lab 02/09/12 0020 02/08/12 0200 02/09/2012 1836  AMMONIA 51 RESULTS UNAVAILABLE DUE TO INTERFERING SUBSTANCE 100*   CBC:  Lab 02/09/12 0116 02/08/12 0200 02/09/2012 1745  WBC 11.0* 9.9 10.8*  NEUTROABS -- 8.4* 8.5*  HGB 8.1* 9.4* 9.5*  HCT 28.8* 32.8* 33.3*  MCV 91.1 90.1 90.7  PLT 318306 351 368   CBG:  Lab 02/09/12 1654 02/09/12 1124 02/09/12 0803 02/08/12 2140 02/08/12 1749  GLUCAP 112* 112* 116* 118* 108*    Recent Results (from the past 240 hour(s))  CULTURE, BLOOD (ROUTINE X 2)     Status: Normal (Preliminary result)   Collection Time   02/21/2012  7:00 PM      Component Value Range Status Comment   Specimen Description BLOOD ARM RIGHT   Final    Special Requests BOTTLES DRAWN AEROBIC AND ANAEROBIC 10CC   Final    Culture  Setup Time 02/08/2012 01:07   Final    Culture     Final    Value:        BLOOD CULTURE RECEIVED NO GROWTH TO DATE CULTURE WILL BE HELD FOR 5 DAYS BEFORE ISSUING A FINAL NEGATIVE REPORT   Report Status PENDING   Incomplete   CULTURE, BLOOD (ROUTINE X 2)     Status: Normal (Preliminary result)   Collection Time   01/25/2012  7:25 PM      Component Value Range Status Comment   Specimen Description BLOOD ARM RIGHT   Final    Special Requests BOTTLES DRAWN AEROBIC AND ANAEROBIC 10CC   Final    Culture  Setup Time 02/08/2012 01:07   Final    Culture     Final    Value:        BLOOD CULTURE RECEIVED NO GROWTH TO DATE CULTURE WILL BE HELD FOR 5 DAYS BEFORE ISSUING A FINAL NEGATIVE REPORT   Report Status PENDING   Incomplete   URINE  CULTURE     Status: Normal   Collection Time   02/23/2012  7:41 PM      Component Value Range Status Comment   Specimen Description URINE, CATHETERIZED   Final    Special Requests NONE   Final    Culture  Setup Time 02/08/2012 20:35   Final  Colony Count NO GROWTH   Final    Culture NO GROWTH   Final    Report Status 02/09/2012 FINAL   Final   MRSA PCR SCREENING     Status: Normal   Collection Time   02/08/12 12:01 AM      Component Value Range Status Comment   MRSA by PCR NEGATIVE  NEGATIVE Final      Scheduled Meds:   . ceFEPime (MAXIPIME) IV  500 mg Intravenous Q24H  . insulin aspart  0-9 Units Subcutaneous TID WC  . insulin glargine  20 Units Subcutaneous Daily  . lactulose  30 g Oral Q6H  . latanoprost  1 drop Both Eyes QHS  . levofloxacin IV  500 mg Intravenous Q48H  . levothyroxine  200 mcg Oral QAC breakfast  . metoprolol tartrate  12.5 mg Oral BID  . vancomycin  1,000 mg Intravenous Q48H   Continuous Infusions:   . sodium chloride 10 mL/hr at 02/09/12 1600     Debbora Presto, MD  [facility] Pager [phone]  If 7PM-7AM, please contact night-coverage [email or web address] Password [email or web address] 02/09/2012, 5:09 PM   LOS: 2 days

## 2012-02-09 NOTE — Progress Notes (Signed)
Dr. Garnette Scheuermann called to check on patient's blood pressure. I informed her I spoke with the NP with [facility] about her BP and that I received orders to administer a 250 cc NS bolus. I notified her that the BP is now 97/35 after the bolus. She wants goal systolic BP to be > 90. New orders received for STAT Ammonia level, CBC, CMP, and DIC panel. Will continue to monitor.  [place] Jaydence Arnesen, RN

## 2012-02-09 NOTE — Progress Notes (Signed)
ANTIBIOTIC CONSULT NOTE - FOLLOW UP  Pharmacy Consult for zosyn Indication: pneumonia  No Known Allergies  Patient Measurements: Height: 5' 1.81" (157 cm) Weight: 209 lb 3.5 oz (94.9 kg) IBW/kg (Calculated) : 49.67    Vital Signs: Temp: 98.4 F (36.9 C) (11/16 2008) Temp src: Axillary (11/16 2008) BP: 93/31 mmHg (11/16 2008) Pulse Rate: 88  (11/16 2008) Intake/Output from previous day: 11/15 0701 - 11/16 0700 In: 1113 [I.V.:1113] Out: 600 [Urine:600] Intake/Output from this shift: Total I/O In: -  Out: 15 [Urine:15]  Labs:  [facility] 02/09/12 1716 02/09/12 0116 02/08/12 0200  WBC 14.7* 11.0* 9.9  HGB 8.4* 8.1* 9.4*  PLT 390 [id number]  LABCREA -- -- --  CREATININE 4.48* 3.88* 3.44*   Estimated Creatinine Clearance: 10.4 ml/min (by C-G formula based on Cr of 4.48). No results found for this basename: VANCOTROUGH:2,VANCOPEAK:2,VANCORANDOM:2,GENTTROUGH:2,GENTPEAK:2,GENTRANDOM:2,TOBRATROUGH:2,TOBRAPEAK:2,TOBRARND:2,AMIKACINPEAK:2,AMIKACINTROU:2,AMIKACIN:2, in the last 72 hours   Microbiology: Recent Results (from the past 720 hour(s))  URINE CULTURE     Status: Normal   Collection Time   01/22/12  4:32 PM      Component Value Range Status Comment   Specimen Description URINE, CATHETERIZED   Final    Special Requests NONE   Final    Culture  Setup Time 01/22/2012 19:13   Final    Colony Count >=100,000 COLONIES/ML   Final    Culture ESCHERICHIA COLI   Final    Report Status 01/24/2012 FINAL   Final    Organism ID, Bacteria ESCHERICHIA COLI   Final   URINE CULTURE     Status: Normal   Collection Time   01/28/12  2:00 AM      Component Value Range Status Comment   Specimen Description URINE, CLEAN CATCH   Final    Special Requests levaquin, rifaximin Normal   Final    Culture  Setup Time 01/28/2012 03:05   Final    Colony Count NO GROWTH   Final    Culture NO GROWTH   Final    Report Status 01/29/2012 FINAL   Final   SURGICAL PCR SCREEN     Status: Normal   Collection Time   01/28/12  5:23 AM      Component Value Range Status Comment   MRSA, PCR NEGATIVE  NEGATIVE Final    Staphylococcus aureus NEGATIVE  NEGATIVE Final   CULTURE, BLOOD (ROUTINE X 2)     Status: Normal (Preliminary result)   Collection Time   02/02/2012  7:00 PM      Component Value Range Status Comment   Specimen Description BLOOD ARM RIGHT   Final    Special Requests BOTTLES DRAWN AEROBIC AND ANAEROBIC 10CC   Final    Culture  Setup Time 02/08/2012 01:07   Final    Culture     Final    Value:        BLOOD CULTURE RECEIVED NO GROWTH TO DATE CULTURE WILL BE HELD FOR 5 DAYS BEFORE ISSUING A FINAL NEGATIVE REPORT   Report Status PENDING   Incomplete   CULTURE, BLOOD (ROUTINE X 2)     Status: Normal (Preliminary result)   Collection Time   02/22/2012  7:25 PM      Component Value Range Status Comment   Specimen Description BLOOD ARM RIGHT   Final    Special Requests BOTTLES DRAWN AEROBIC AND ANAEROBIC 10CC   Final    Culture  Setup Time 02/08/2012 01:07   Final    Culture  Final    Value:        BLOOD CULTURE RECEIVED NO GROWTH TO DATE CULTURE WILL BE HELD FOR 5 DAYS BEFORE ISSUING A FINAL NEGATIVE REPORT   Report Status PENDING   Incomplete   URINE CULTURE     Status: Normal   Collection Time   02/01/2012  7:41 PM      Component Value Range Status Comment   Specimen Description URINE, CATHETERIZED   Final    Special Requests NONE   Final    Culture  Setup Time 02/20/2012 20:35   Final    Colony Count NO GROWTH   Final    Culture NO GROWTH   Final    Report Status 02/09/2012 FINAL   Final   MRSA PCR SCREENING     Status: Normal   Collection Time   02/08/12 12:01 AM      Component Value Range Status Comment   MRSA by PCR NEGATIVE  NEGATIVE Final      Assessment: Shannon Charles is a 78 yo F known to pharmacy from vanc/levaquin/maxipime protocol started 02/19/2012.  Today she is to stop cefepime and start zosyn for PNA.  Her WBC is up to 14.7 and her tmax is 99.  She is in  acute renal failure with creatinine up to 4.48 today. Crcl ~ 10 ml/min   Plan:  1. Zosyn 2.25 gm IV q8h 2. She continues on vanc and levaquin Herby Abraham, Pharm.D. [phone] 02/09/2012 9:03 PM

## 2012-02-09 NOTE — Progress Notes (Signed)
Pt's oxygen saturations began dropping into the mid 80's, going as low as 84. Pt was placed on a venti-mask but only brought saturations to 86. Then placed on non-rebreather, with saturations coming up to the mid 90's. Dr. Izola Price notified, new orders received. Will continue to monitor.

## 2012-02-09 NOTE — Progress Notes (Signed)
Pt B/P continues to remain 90's/30's after administration of 500cc NS bolus. Urine output also remains low with a total 70cc since 0800.  Dr. Izola Price notified. No new orders at this time. Will continue to monitor.

## 2012-02-09 NOTE — Progress Notes (Signed)
eLink Physician-Brief Progress Note Patient Name: Shannon Charles DOB: [birth year] MRN: [id number]  Date of Service  02/09/2012   HPI/Events of Note   I talked to patient's husband, granddaughter and daughter in law; they wanted to readdress the code status.   eICU Interventions  Code status was now changed according to the family wish and based on patient's previously expressed wishes to a limited code, with no intubation but with CPR and vasoactive and antiarrythmic drugs. The husband would not want [name] to have "tubes" so no central line at this time.       Erminia Mcnew 02/09/2012, 8:40 PM

## 2012-02-09 NOTE — Progress Notes (Addendum)
CRITICAL VALUE ALERT2  Critical value received:  PH: 7.19; CO2: 68.5   Date of notification:  02/09/2012  Time of notification:  2140  Critical value read back:yes  Nurse who received alert:  Maximino Greenland  MD notified (1st page):  PA Claiborne Billings   Time of first page:  2145   MD notified (2nd page): MD Albon  Time of second page: 2150  Responding MD: NP Claiborne Billings advised me to consult with MD Albon  Time MD responded:  2150

## 2012-02-09 NOTE — Progress Notes (Signed)
Pt most recent B/P is 94/37 with decreased urine output. Dr. Izola Price notified, new order received to administed 500cc NS bolus. Will administer bolus and continue to monitor.

## 2012-02-10 DIAGNOSIS — R0989 Other specified symptoms and signs involving the circulatory and respiratory systems: Secondary | ICD-10-CM

## 2012-02-10 DIAGNOSIS — Z515 Encounter for palliative care: Secondary | ICD-10-CM

## 2012-02-10 DIAGNOSIS — R0600 Dyspnea, unspecified: Secondary | ICD-10-CM

## 2012-02-10 DIAGNOSIS — R0609 Other forms of dyspnea: Secondary | ICD-10-CM

## 2012-02-14 LAB — CULTURE, BLOOD (ROUTINE X 2)
Culture: NO GROWTH
Culture: NO GROWTH

## 2012-02-17 NOTE — Discharge Summary (Addendum)
Death Summary  Shannon Charles ZOX:[id number] DOB: Apr 30, 1927 DOA: 02/23/2012  PCP: Romero Belling, MD PCP/Office notified:  Admit date: 02/15/2012 Date of Death: 02/13/12 at 11:15 pm  Final Diagnoses:  Principal Problem:  *Encephalopathy acute Active Problems:  DIABETES MELLITUS, TYPE I  ARF (acute renal failure)  Healthcare-associated pneumonia  Palliative care encounter  Dyspnea   History of present illness:  78 year old female SNF resident who was just recently discharged after being worked up for TIA and treated for UTI and had T12 compression fracture with history of cirrhosis secondary to [name] was found to be confused at around 4 PM today and prior to that was normal. Patient was initially brought to the ER as a code stroke but the patient did not have any focal deficits and CT was negative code stroke was canceled. Subsequent labs showed increased ammonia levels and chest x-ray was showing infiltrates compatible with pneumonia. In addition patient's creatinine has increased to 3 from normal last. Patient has been admitted for encephalopathy probably secondary to hepatic and metabolic reasons with pneumonia and renal failure. Patient at this time is alert and is oriented to name and follows commands but looks irritable. As per family patient did not have any nausea vomiting chest pain or any diarrhea. Patient did have mild shortness of breath.    Hospital Course:  Haptic encephalopathy  - secondary to progressive failure to thrive and multiorgan system failure  - omfort care provided as per family wishes - pt is was minimally responsive over 24 hour period, palliative care involved to ensure comfort provided as well family support Sepsis secondary to Healthcare acquired Pneumonia - respiratory distress with hypoxia and secondary to HCAP, volume overload  - on exam pt hypotensive with leukocytosis and resultant sepsis - broad spectrum abx coverage provided but pt responded  poorly - pt remained grossly edematous and hypotensive  - family opted for comfort care and this was respected  Leukocytosis  - secondary to an infectious etiology  - no further blood draws per family request Acute renal failure  - creatinine is trending up  - no further blood draws as noted above  Hyperkalemia  - worsening and no further aggressive therapy to ensure comfort  Cirrhosis due to NASH  - d/c Lactulose as pt is not able to take PO   Signed:  MAGICK-Shannon Charles  [facility] 02/17/2012, 10:08 PM

## 2012-02-24 NOTE — Progress Notes (Signed)
At patient's bedside with [facility] RN, patient has no respiration at all. No visible chest wall movement and absence of breath sound, no pulses, pupils are fixed and non-reactive.  Skin is cool to touch.  We are pronouncing that death has occur at this time.  MD notfied.  Patient husband is present at bedside, he decline chaplain service.

## 2012-02-24 NOTE — Progress Notes (Signed)
Pt is comfort care but still getting labs and sticks, paged NP Claiborne Billings and he discontinued eye drops, troponin, CBG, and insulin. Abx therapy will be discussed with rounding team in AM.  [place], [place] RN

## 2012-02-24 NOTE — Progress Notes (Signed)
At 1930 pts code status was changed from DNR to partial code, no intubation with Dr. Frederico Hamman. Dr. Dagoberto Ligas came and spoke with the husband in person, and the granddaughter and daughter-in-law over the phone. The family decided to change her code status to DNR and make the pt comfort care.  Shannon Charles, [place]

## 2012-02-24 NOTE — Progress Notes (Signed)
Patient ID: Shannon Charles, female   DOB: [birth year], 78 y.o.   MRN: [id number]  [facility] PROGRESS NOTE  Shannon Charles LKG:[id number] DOB: 12-28-1927 DOA: 02/02/2012 PCP: Romero Belling, MD  Brief narrative: 78 year old female SNF resident who was just recently discharged after being worked up for TIA and treated for UTI and had T12 compression fracture with history of cirrhosis secondary to [name] was found to be confused at around 4 PM today and prior to that was normal. Patient was initially brought to the ER as a code stroke but the patient did not have any focal deficits and CT was negative code stroke was canceled. Subsequent labs showed increased ammonia levels and chest x-ray was showing infiltrates compatible with pneumonia. In addition patient's creatinine has increased to 3 from normal last. Patient has been admitted for encephalopathy probably secondary to hepatic and metabolic reasons with pneumonia and renal failure. Patient at this time is alert and is oriented to name and follows commands but looks irritable. As per family patient did not have any nausea vomiting chest pain or any diarrhea. Patient did have mild shortness of breath.  Assessment/Plan:  Haptic encephalopathy  - secondary to progressive failure to thrive and multiorgan system failure - pt now with comfort care only and appears to be in no distress this AM - pt is minimally responsive and palliative care is on board Healthcare acquired Pneumonia vs/ edema  - respiratory distress with hypoxia and secondary to HCAP, volume overload - pt grossly edematous and hypotensive - family opted for comfort care and this will be respected - will plan to transfer to floor and continue to provide comfort  Leukocytosis  - secondary to an infectious etiology - no further blood draws to ensure the comfort of the patient Acute renal failure  - creatinine is trending up  - no further blood draws as noted above Hyperkalemia  -  worsening and no further aggressive therapy to ensure comfort  Cirrhosis due to NASH  - d/c Lactulose as pt is not able to take PO  Alzheimer's dementia  - Husband reports that pt has signif cognitive deficits at baseline  T12 compression fracture  - pain control as needed  Hypernatremia  - no further blood work Acute on chronic anemia  - Hg drop since yesterday   DM  - Reasonably controlled at present HTN  - hypotensive today   Code Status: DNR/NO CODE BLUE  Family Communication: spoke w/ husband at bedside  Disposition Plan: Transfer to floor   Consultants:  Palliative care team PCCM  Procedures:  Ct Head Wo Contrast  02/19/2012  IMPRESSION:  No acute intracranial finding.  Dg Chest Port 1 View  02/04/2012  IMPRESSION:  1. Bilateral airspace opacities which may represent edema or multifocal infection.   Antibiotics:  maxepime 11/15 >> d/c levaquin 11/15 >> d/c  vanc 11/15 >> d/c  HPI/Subjective: No events overnight.   Objective: Filed Vitals:   10-Mar-2012 0200 03-10-2012 0400 March 10, 2012 0800 03/10/12 1605  BP: 73/32 75/34 87/32    Pulse: 86 81 81   Temp:  97.5 F (36.4 C) 98.9 F (37.2 C) 97.5 F (36.4 C)  TempSrc:  Oral Oral Oral  Resp: 12 12 16    Height:      Weight:  98 kg (216 lb 0.8 oz)    SpO2: 97% 98% 92%     Intake/Output Summary (Last 24 hours) at 03/10/12 1617 Last data filed at 03/10/12 1606  Gross per 24 hour  Intake 514.88 ml  Output     20 ml  Net 494.88 ml    Exam:   General:  Pt is minimally responsive, did open eyes when called by name, not moving extremities  Cardiovascular: Regular rate and rhythm, S1/S2, no murmurs, no rubs, no gallops  Respiratory: Bilateral crackles with rales, not in respiratory distress  Abdomen: Soft, non tender, distended, bowel sounds present, no guarding  Extremities: Generalized anasarca  Neuro: Pt minimally responsive   Data Reviewed: Basic Metabolic Panel:  Lab 02/09/12 5409 02/09/12 0116  02/08/12 0200 02/16/2012 1745  NA 147* 147* 139 142  K 5.5* 5.0 6.1* 6.6*  CL 112 112 105 107  CO2 26 27 26 29   GLUCOSE 117* 112* 89 82  BUN 68* 62* 60* 60*  CREATININE 4.48* 3.88* 3.44* 3.47*  CALCIUM 8.4 8.4 8.5 8.7  MG -- -- -- --  PHOS -- -- -- --   Liver Function Tests:  Lab 02/09/12 1716 02/09/12 0116 02/13/2012 1745  AST 27 28 38*  ALT 10 10 13   ALKPHOS 117 119* 142*  BILITOT 0.3 0.4 0.4  PROT 6.3 5.9* 6.7  ALBUMIN 2.2* 2.2* 2.3*    Lab 02/09/12 0020 02/08/12 0200 01/27/2012 1836  AMMONIA 51 RESULTS UNAVAILABLE DUE TO INTERFERING SUBSTANCE 100*   CBC:  Lab 02/09/12 1716 02/09/12 0116 02/08/12 0200 02/19/2012 1745  WBC 14.7* 11.0* 9.9 10.8*  NEUTROABS -- -- 8.4* 8.5*  HGB 8.4* 8.1* 9.4* 9.5*  HCT 30.2* 28.8* 32.8* 33.3*  MCV 93.8 91.1 90.1 90.7  PLT 390 318306 351 368   Cardiac Enzymes:  Lab 02/09/12 2149  CKTOTAL --  CKMB --  CKMBINDEX --  TROPONINI <0.30   CBG:  Lab 02/09/12 2203 02/09/12 1654 02/09/12 1124 02/09/12 0803 02/08/12 2140  GLUCAP 107* 112* 112* 116* 118*    Recent Results (from the past 240 hour(s))  CULTURE, BLOOD (ROUTINE X 2)     Status: Normal (Preliminary result)   Collection Time   02/16/2012  7:00 PM      Component Value Range Status Comment   Specimen Description BLOOD ARM RIGHT   Final    Special Requests BOTTLES DRAWN AEROBIC AND ANAEROBIC 10CC   Final    Culture  Setup Time 02/08/2012 01:07   Final    Culture     Final    Value:        BLOOD CULTURE RECEIVED NO GROWTH TO DATE CULTURE WILL BE HELD FOR 5 DAYS BEFORE ISSUING A FINAL NEGATIVE REPORT   Report Status PENDING   Incomplete   CULTURE, BLOOD (ROUTINE X 2)     Status: Normal (Preliminary result)   Collection Time   02/09/2012  7:25 PM      Component Value Range Status Comment   Specimen Description BLOOD ARM RIGHT   Final    Special Requests BOTTLES DRAWN AEROBIC AND ANAEROBIC 10CC   Final    Culture  Setup Time 02/08/2012 01:07   Final    Culture     Final    Value:         BLOOD CULTURE RECEIVED NO GROWTH TO DATE CULTURE WILL BE HELD FOR 5 DAYS BEFORE ISSUING A FINAL NEGATIVE REPORT   Report Status PENDING   Incomplete   URINE CULTURE     Status: Normal   Collection Time   01/26/2012  7:41 PM      Component Value Range Status Comment   Specimen Description URINE, CATHETERIZED   Final  Special Requests NONE   Final    Culture  Setup Time 01/28/2012 20:35   Final    Colony Count NO GROWTH   Final    Culture NO GROWTH   Final    Report Status 02/09/2012 FINAL   Final   MRSA PCR SCREENING     Status: Normal   Collection Time   02/08/12 12:01 AM      Component Value Range Status Comment   MRSA by PCR NEGATIVE  NEGATIVE Final      Scheduled Meds:   . levofloxacin  IV  500 mg Intravenous Q48H  . ZOSYN IV  2.25 g Intravenous Q8H  . scopolamine  1 patch Transdermal Q72H  . vancomycin  1,000 mg Intravenous Q48H   Continuous Infusions:   . sodium chloride 10 mL/hr at 02/09/12 1900  . morphine 1 mg/hr (Feb 16, 2012 0800)     Debbora Presto, MD  [facility] Pager [phone]  If 7PM-7AM, please contact night-coverage [email or web address] Password [email or web address] 16-Feb-2012, 4:17 PM   LOS: 3 days

## 2012-02-24 NOTE — Consult Note (Signed)
Patient NU:UVOZDG F Borge      DOB: Aug 12, 1927      UYQ:[id number]     Consult Note from the Palliative Medicine Team at [facility]    Consult Requested by: Dr. Lenise Arena    PCP: Romero Belling, MD Reason for Consultation: Affirm goals of care    Phone [phone] And symptom management Assessment of patients Current state: 78 year old african [place] female with known NASH, dementia , TIA, and renal insufficiency.  She was recently hospitalized for treatment of UTI, and was found to have a T12 fracture.  She returned to the hospital after being found to be confused .  Her creatinine was found to be increased from normal to 3 and she was encephalopathic with an ammonia level of 100. Xray also suggested pneumonia. Ct scan was negative for stroke.  Patient continued to decline with evidence for hypotension.  Family spoke with Critical care and elected DNR status.  Further conversations were had today and the family agreed that with her poor prognosis that they wanted comfort care which was confirmed during our conversation.  They want her comfortable and are open to limited abx to avoid lab draws, continued morphine for comfort and are ok with transition to a general medical area.  The Patient herself is unresponsive and near death on very minimal morphine 1 mg/hr.   Goals of Care: 1.  Code Status: DNR/DNI   2. Scope of Treatment: 1. Vital Signs: q day and prn 2. Respiratory/Oxygen: as needed 3. Nutritional Support/Tube Feeds: no tube feedings.  Patient is near death 4. Antibiotics: Continue zosyn , and quinolone for comfort.  D/C Vanc.  Family may elect to dc others as well if she survives 5. Labs: no further labs 6. Telemetry: can be stopped   4. Disposition: Expecting a hospital death.   3. Symptom Management:   1. Pain: on morphine drip can be titrated if needed for signs of pain 2. Dyspnea: better controled on morphine may be titrated for comfort 3. Terminal Secretions:  scopolamine in place  4. Psychosocial: married for 62 years.  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  Originally from [place]  5. Spiritual:  Strong ties to the [employer] and [employer] .  Provided CD player with [facility] CD.        Patient Documents Completed or Given: Document Given Completed  Advanced Directives Pkt    MOST    DNR    Gone from My Sight    Hard Choices      Brief HPI: 78 year old admitted with increased confusion found to be in new renal failure and worsened liver failure with ammonia of 100.  Family has elected comfort care.     ROS:  Unable to obtain do to patient being obtunded.    PMH:  Past Medical History  Diagnosis Date  . DEMENTIA 08/23/2006  . DEPRESSION 08/23/2006  . DIABETES MELLITUS, TYPE I 08/23/2006  . HYPERLIPIDEMIA 08/23/2006  . HYPERTENSION 08/23/2006  . HYPOTHYROIDISM 08/23/2006  . OSTEOARTHRITIS 08/23/2006  . RENAL INSUFFICIENCY 08/23/2006  . Personal history of colonic polyps 04/29/2007  . Lung disease, restrictive     Mild, by pulmonary function test in October 2006 with mildly reduce DLCO  . Pain in limb   . Chronic pain syndrome   . Lack of coordination   . Lumbago   . Primary localized osteoarthrosis, lower leg   . Disorders of bursae and tendons in shoulder region, unspecified   .  Bell's palsy     per Husband  . Cirrhosis      PSH: Past Surgical History  Procedure Date  . Total abdominal hysterectomy   . Colonoscopy w/ polypectomy    I have reviewed the FH and SH and  If appropriate update it with new information. No Known Allergies Scheduled Meds:   . levofloxacin (LEVAQUIN) IV  500 mg Intravenous Q48H  . piperacillin-tazobactam (ZOSYN)  IV  2.25 g Intravenous Q8H  . scopolamine  1 patch Transdermal Q72H  . sodium chloride  3 mL Intravenous Q12H  . vancomycin  1,000 mg Intravenous Q48H  . [DISCONTINUED] ceFEPime (MAXIPIME) IV  500 mg Intravenous Q24H  . [DISCONTINUED] dorzolamide-timolol  1 drop Both  Eyes BID  . [DISCONTINUED] insulin aspart  0-9 Units Subcutaneous TID WC  . [DISCONTINUED] insulin glargine  20 Units Subcutaneous Daily  . [DISCONTINUED] lactulose  30 g Oral Q6H  . [DISCONTINUED] latanoprost  1 drop Both Eyes QHS  . [DISCONTINUED] levothyroxine  200 mcg Oral QAC breakfast  . [DISCONTINUED] metoprolol tartrate  12.5 mg Oral BID   Continuous Infusions:   . sodium chloride 10 mL/hr at 02/09/12 1900  . morphine 1 mg/hr (08-Mar-2012 0800)   PRN Meds:.acetaminophen, acetaminophen, haloperidol lactate, ondansetron (ZOFRAN) IV, ondansetron, [DISCONTINUED]  morphine injection, [DISCONTINUED]  morphine injection    BP 87/32  Pulse 81  Temp 97.5 F (36.4 C) (Oral)  Resp 16  Ht 5' 1.81" (1.57 m)  Wt 98 kg (216 lb 0.8 oz)  BMI 39.76 kg/m2  SpO2 92%   PPS:10 %   Intake/Output Summary (Last 24 hours) at 03-08-12 1606 Last data filed at Mar 08, 2012 0800  Gross per 24 hour  Intake 514.88 ml  Output     20 ml  Net 494.88 ml   LBM: 02/08/12                     Physical Exam:  General: obtunded will flutter eyes some without purpose or pattern HEENT:  PERRL, EOM can not be assessed Chest:  Decreased air entry with crackles at the bases CVS: regular rate and rhythm Abdomen:obese, distended with intermittent bowel sounds Ext: Grossly swollen Neuro: obtunded, not responding to tactile or verbal stimuli, but will occasionally open eyes.  Labs: CBC    Component Value Date/Time   WBC 14.7* 02/09/2012 1716   RBC 3.22* 02/09/2012 1716   HGB 8.4* 02/09/2012 1716   HCT 30.2* 02/09/2012 1716   PLT 390 02/09/2012 1716   MCV 93.8 02/09/2012 1716   MCH 26.1 02/09/2012 1716   MCHC 27.8* 02/09/2012 1716   RDW 20.3* 02/09/2012 1716   LYMPHSABS 0.7 02/08/2012 0200   MONOABS 0.6 02/08/2012 0200   EOSABS 0.2 02/08/2012 0200   BASOSABS 0.0 02/08/2012 0200    Results for CEYLIN, DREIBELBIS (MRN [id number]) as of 03-08-12 17:06  02/09/2012 21:27  FIO2 1.00  pH, Arterial 7.190  (LL)  pCO2 arterial 68.5 (HH)  pO2, Arterial 93.4  Bicarbonate 25.2 (H)  TCO2 27.3  Acid-base deficit 2.0  O2 Saturation 98.4  Patient temperature 98.6    CMP     Component Value Date/Time   NA 147* 02/09/2012 1716   K 5.5* 02/09/2012 1716   CL 112 02/09/2012 1716   CO2 26 02/09/2012 1716   GLUCOSE 117* 02/09/2012 1716   BUN 68* 02/09/2012 1716   CREATININE 4.48* 02/09/2012 1716   CALCIUM 8.4 02/09/2012 1716   CALCIUM 9.4 12/22/2009 2322   PROT 6.3  02/09/2012 1716   ALBUMIN 2.2* 02/09/2012 1716   AST 27 02/09/2012 1716   ALT 10 02/09/2012 1716   ALKPHOS 117 02/09/2012 1716   BILITOT 0.3 02/09/2012 1716   GFRNONAA 8* 02/09/2012 1716   GFRAA 10* 02/09/2012 1716    Chest Xray Reviewed/Impressions: Progression of bilateral infiltrates  CT scan of the Head Reviewed/Impressions: no acute intracranial findings.    Time In Time Out Total Time Spent with Patient Total Overall Time  300 pm 330 pm 30 min 30 min    Greater than 50%  of this time was spent counseling and coordinating care related to the above assessment and plan.    Tongela Encinas L. Ladona Ridgel, MD MBA The Palliative Medicine Team at [facility] Phone: [phone] Pager: [phone]

## 2012-02-24 NOTE — Progress Notes (Signed)
Pt transferred to [id number], per MD order. Report called to receiving nurse and all questions answered. Family aware of transfer.

## 2012-02-24 DEATH — deceased

## 2014-04-14 IMAGING — CT CT HEAD W/O CM
1 of 2 series · 16 of 30 positions shown, 20 images · non-contrast
Comparison: July 14, 2011.

CLINICAL DATA: Facial droop.

CT HEAD WITHOUT CONTRAST
TECHNIQUE: Contiguous axial images were obtained from the base of
the skull through the vertex without contrast.

[Series 2: (id) head 4.8 h37s st · axial · 0.41mm/px · z∈[-156,-32]mm · 16 of 30 slices shown, 20 images]
[im 2/30  brain]
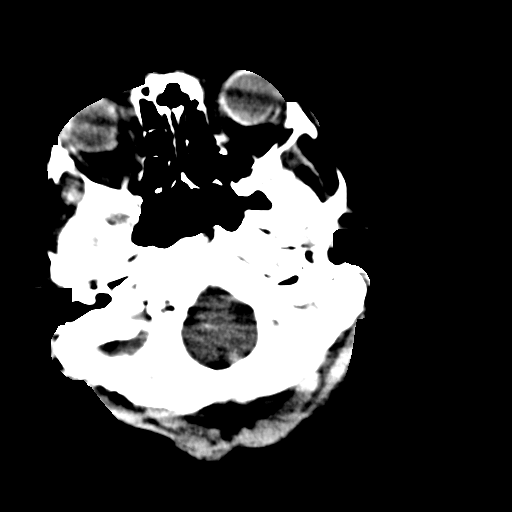
[im 2/30  bone]
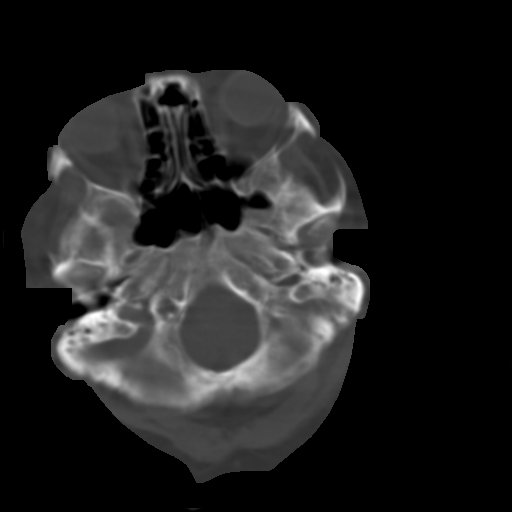
[im 3/30  brain]
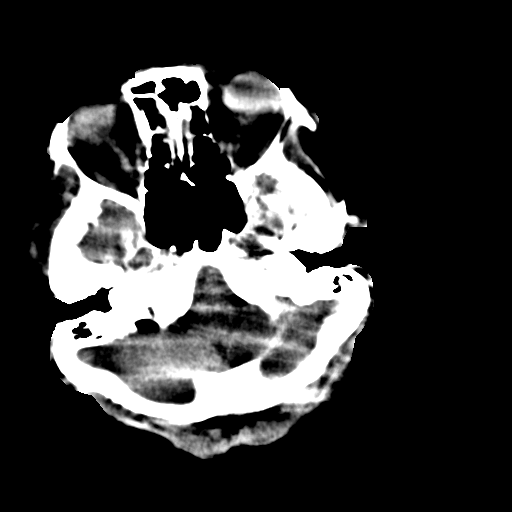
[im 5/30  brain]
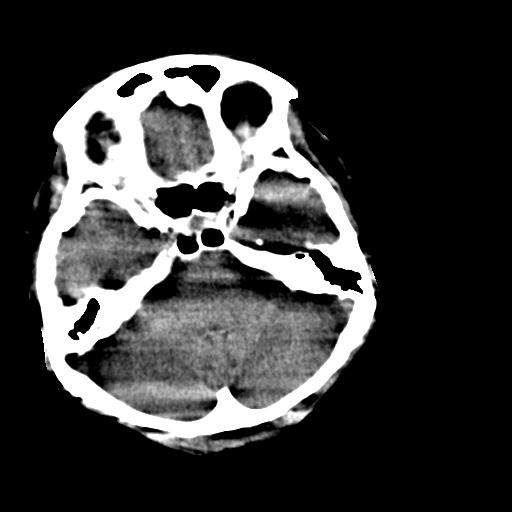
[im 8/30  brain]
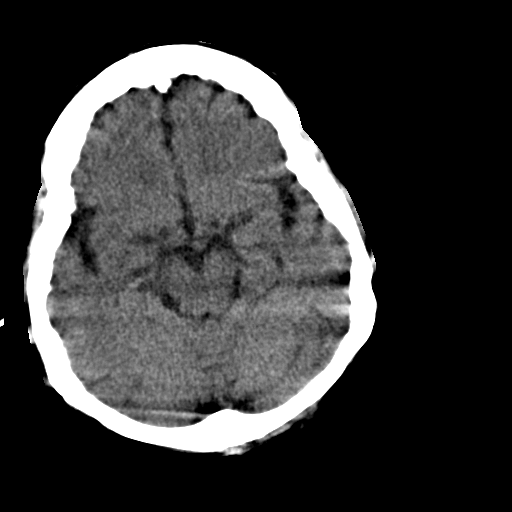
[im 9/30  brain]
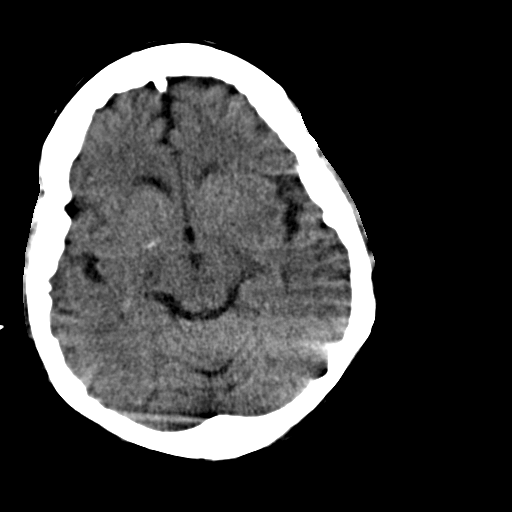
[im 9/30  bone]
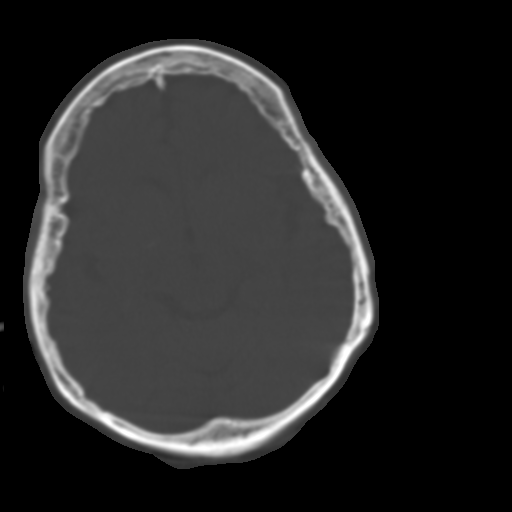
[im 11/30  brain]
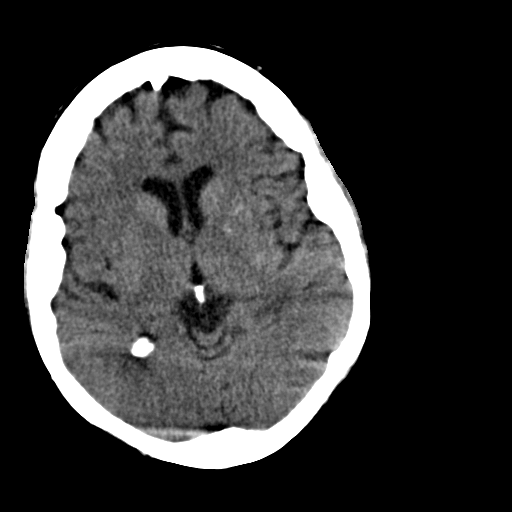
[im 12/30  brain]
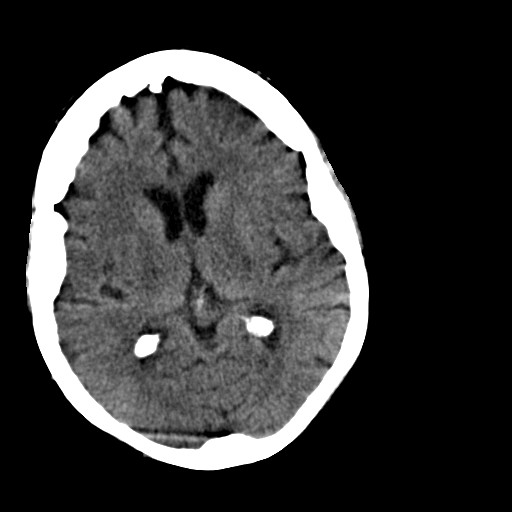
[im 14/30  brain]
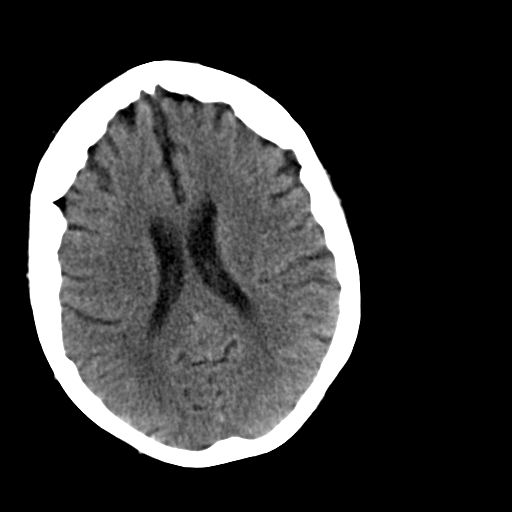
[im 16/30  brain]
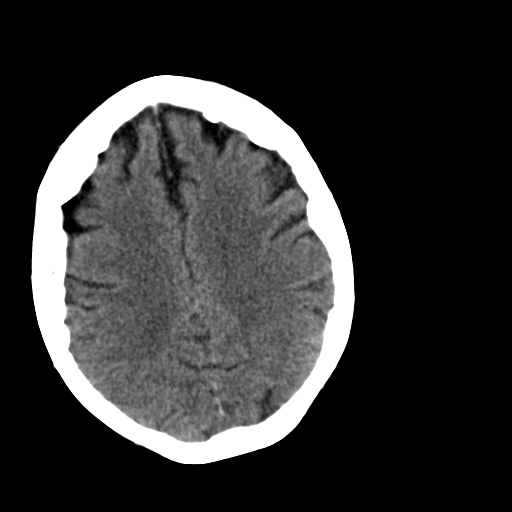
[im 16/30  bone]
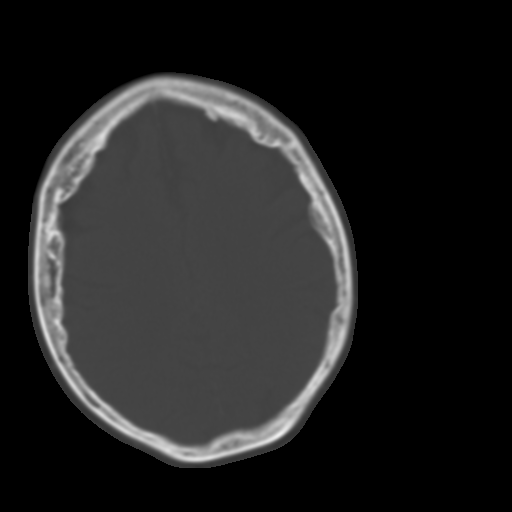
[im 18/30  brain]
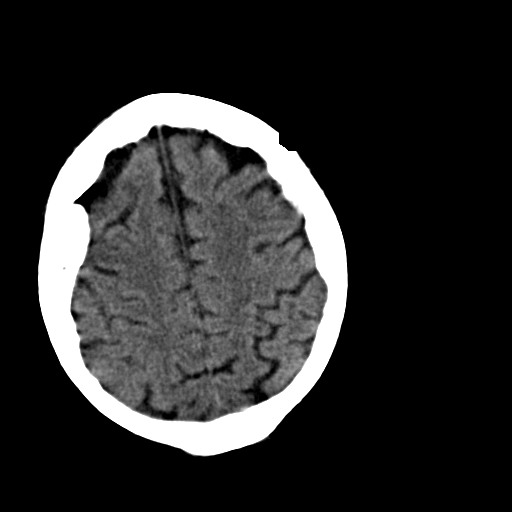
[im 19/30  brain]
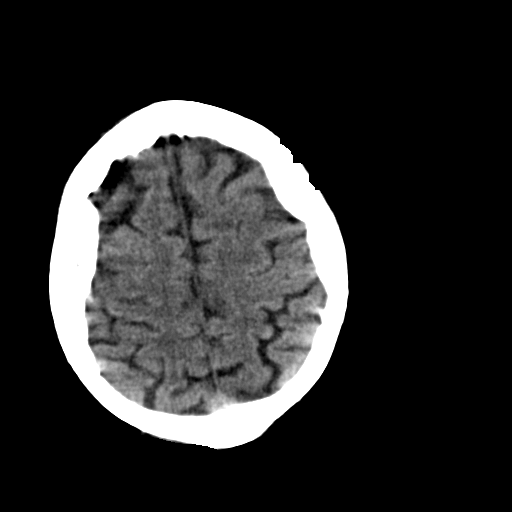
[im 21/30  brain]
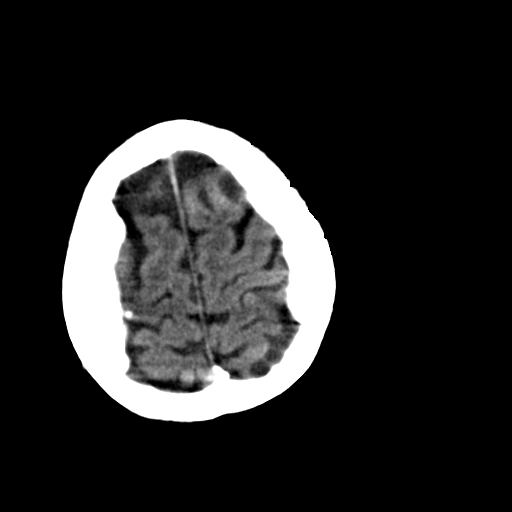
[im 22/30  brain]
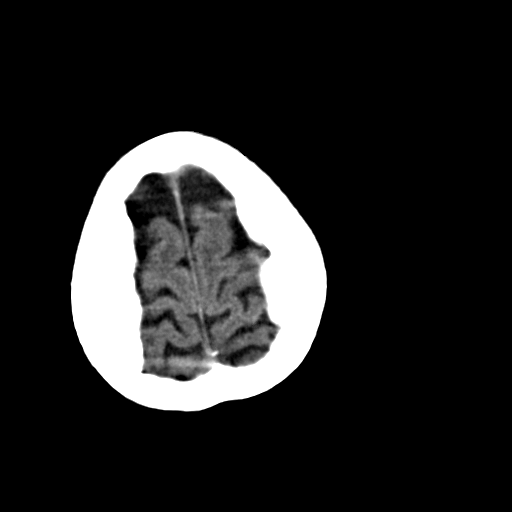
[im 22/30  bone]
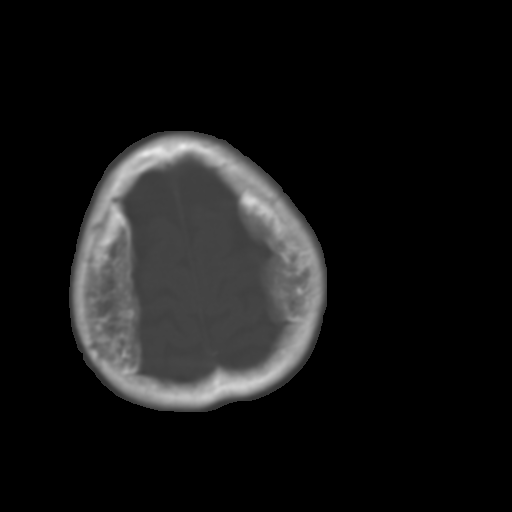
[im 25/30  brain]
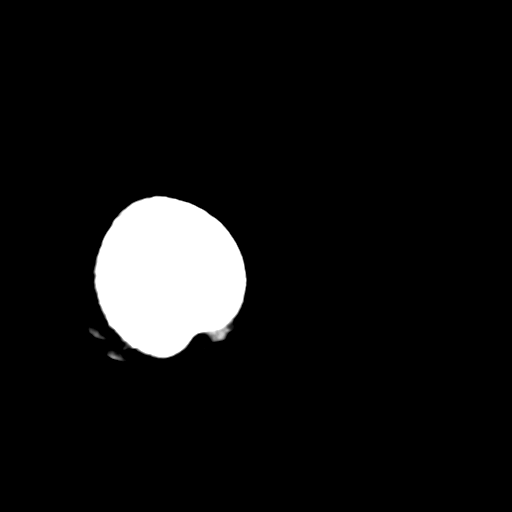
[im 27/30  brain]
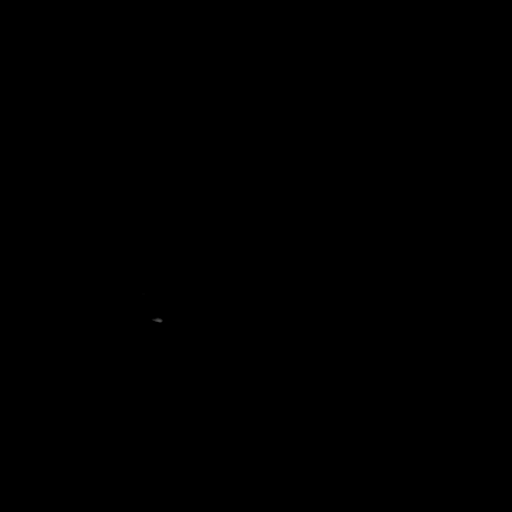
[im 28/30  brain]
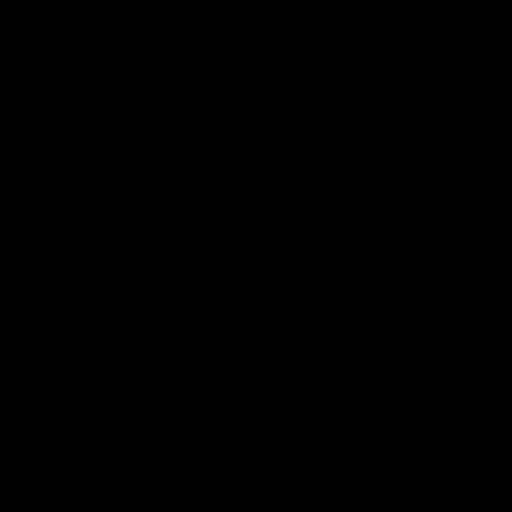

[16 of 30 positions shown; findings below may reference images not displayed]

FINDINGS: Bony calvarium is intact.  No mass effect or midline
shift is noted.  Ventricular size is within normal limits.  There
is no evidence of mass lesion, hemorrhage or acute infarction.
IMPRESSION: No acute intracranial abnormality is noted.

## 2014-04-14 IMAGING — CR DG CHEST 1V
1 series · 1 of 1 positions shown · non-contrast
Comparison: Chest x-ray 11/07/2011.

CLINICAL DATA: Altered mental status.  Shortness of breath.

CHEST - 1 VIEW

[x chest ap]
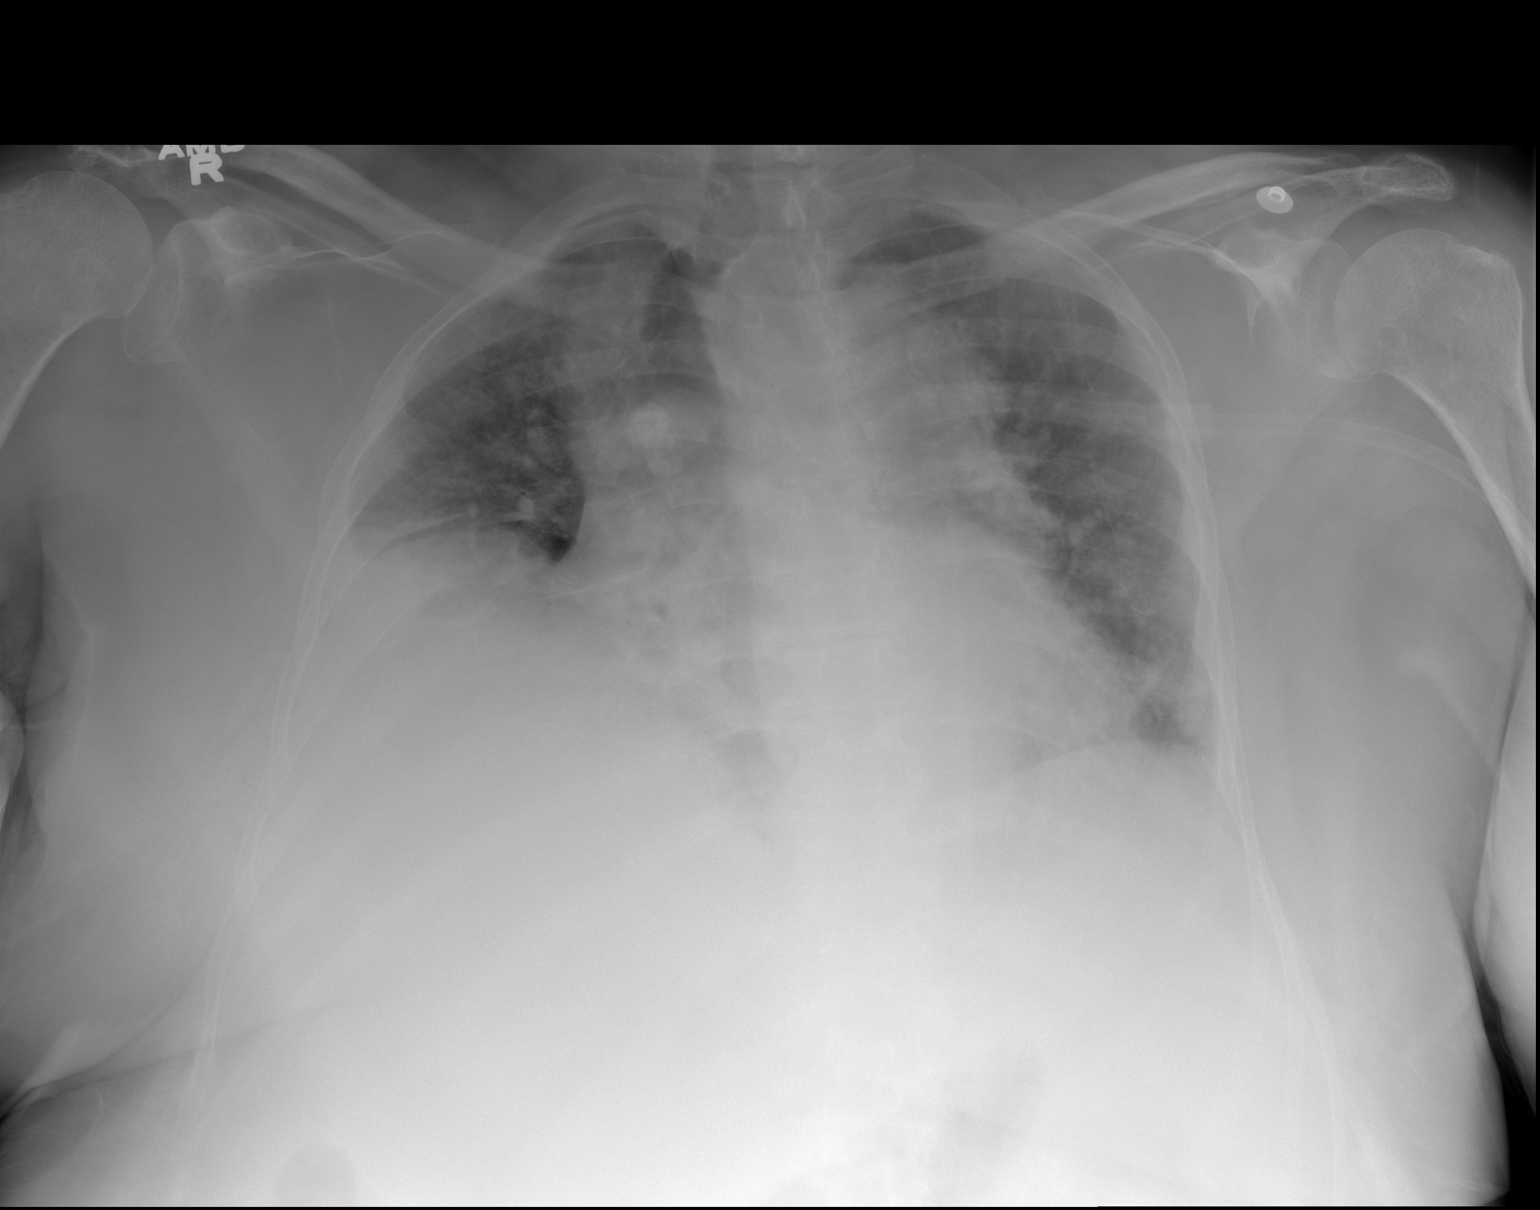

[1 of 1 positions shown; findings below may reference images not displayed]

FINDINGS: Lung volumes are very low, and there is persistent
elevation of the right hemidiaphragm (noted on prior).  Even
allowing for these low lung volumes, there is blunting of the right
costophrenic sulcus, suggesting a small right pleural effusion.
Crowding of the pulmonary vasculature is noted, without frank
pulmonary edema.  Bibasilar opacities are favored to represent
subsegmental atelectasis, however, underlying airspace
consolidation is difficult to exclude.  Mild cardiomegaly is
unchanged. The patient is rotated to the right on today's exam,
resulting in distortion of the mediastinal contours and reduced
diagnostic sensitivity and specificity for mediastinal pathology.
Atherosclerosis in the thoracic aorta.  Calcified mediastinal and
bilateral hilar lymph nodes suggesting old granulomatous disease.
IMPRESSION: 1.  Low lung volumes with probable bibasilar subsegmental
atelectasis and a small right pleural effusion.
2.  Cardiomegaly with pulmonary venous congestion, but no frank
pulmonary edema at this time.
3.  Atherosclerosis.
4.  Sequelae of old granulomatous disease.

## 2017-06-07 NOTE — Telephone Encounter (Signed)
error
